# Patient Record
Sex: Male | Born: 1965 | Race: White | Hispanic: No | Marital: Married | Smoking: Former smoker
Health system: Southern US, Community
[De-identification: ages and names within clinical notes are randomized; demographics above are authoritative.]

## PROBLEM LIST (undated history)

## (undated) DIAGNOSIS — I5020 Unspecified systolic (congestive) heart failure: Secondary | ICD-10-CM

## (undated) DIAGNOSIS — I2510 Atherosclerotic heart disease of native coronary artery without angina pectoris: Secondary | ICD-10-CM

## (undated) DIAGNOSIS — Z951 Presence of aortocoronary bypass graft: Secondary | ICD-10-CM

## (undated) DIAGNOSIS — I200 Unstable angina: Secondary | ICD-10-CM

## (undated) DIAGNOSIS — Z8249 Family history of ischemic heart disease and other diseases of the circulatory system: Secondary | ICD-10-CM

## (undated) DIAGNOSIS — C8190 Hodgkin lymphoma, unspecified, unspecified site: Secondary | ICD-10-CM

## (undated) DIAGNOSIS — I255 Ischemic cardiomyopathy: Secondary | ICD-10-CM

## (undated) DIAGNOSIS — I447 Left bundle-branch block, unspecified: Secondary | ICD-10-CM

## (undated) DIAGNOSIS — Z923 Personal history of irradiation: Secondary | ICD-10-CM

## (undated) DIAGNOSIS — I10 Essential (primary) hypertension: Secondary | ICD-10-CM

## (undated) DIAGNOSIS — E785 Hyperlipidemia, unspecified: Secondary | ICD-10-CM

## (undated) HISTORY — PX: CARDIAC CATHETERIZATION: SHX172

## (undated) HISTORY — PX: CARPAL TUNNEL RELEASE: SHX101

## (undated) HISTORY — DX: Ischemic cardiomyopathy: I25.5

## (undated) HISTORY — DX: Unspecified systolic (congestive) heart failure: I50.20

---

## 1992-01-07 DIAGNOSIS — C8190 Hodgkin lymphoma, unspecified, unspecified site: Secondary | ICD-10-CM

## 1992-01-07 HISTORY — PX: LYMPH NODE BIOPSY: SHX201

## 1992-01-07 HISTORY — DX: Hodgkin lymphoma, unspecified, unspecified site: C81.90

## 1992-10-21 DIAGNOSIS — Z923 Personal history of irradiation: Secondary | ICD-10-CM

## 1992-10-21 HISTORY — DX: Personal history of irradiation: Z92.3

## 2004-01-22 ENCOUNTER — Ambulatory Visit: Payer: Self-pay | Admitting: Oncology

## 2004-02-07 ENCOUNTER — Ambulatory Visit: Payer: Self-pay | Admitting: Oncology

## 2004-07-29 ENCOUNTER — Ambulatory Visit: Payer: Self-pay | Admitting: Oncology

## 2004-08-06 ENCOUNTER — Ambulatory Visit: Payer: Self-pay | Admitting: Oncology

## 2004-11-26 ENCOUNTER — Ambulatory Visit: Payer: Self-pay | Admitting: Oncology

## 2004-12-02 ENCOUNTER — Ambulatory Visit: Payer: Self-pay | Admitting: Oncology

## 2004-12-06 ENCOUNTER — Ambulatory Visit: Payer: Self-pay | Admitting: Oncology

## 2005-06-02 ENCOUNTER — Ambulatory Visit: Payer: Self-pay | Admitting: Oncology

## 2006-05-07 ENCOUNTER — Ambulatory Visit: Payer: Self-pay | Admitting: Oncology

## 2006-05-08 ENCOUNTER — Emergency Department: Payer: Self-pay | Admitting: Emergency Medicine

## 2006-06-18 ENCOUNTER — Ambulatory Visit: Payer: Self-pay | Admitting: Oncology

## 2006-06-26 ENCOUNTER — Emergency Department: Payer: Self-pay | Admitting: Emergency Medicine

## 2006-07-07 ENCOUNTER — Ambulatory Visit: Payer: Self-pay | Admitting: Oncology

## 2007-02-17 ENCOUNTER — Emergency Department: Payer: Self-pay | Admitting: Emergency Medicine

## 2007-07-07 ENCOUNTER — Ambulatory Visit: Payer: Self-pay | Admitting: Oncology

## 2010-07-25 ENCOUNTER — Ambulatory Visit: Payer: Self-pay | Admitting: Internal Medicine

## 2011-10-21 ENCOUNTER — Inpatient Hospital Stay: Payer: Self-pay | Admitting: Internal Medicine

## 2011-10-22 ENCOUNTER — Inpatient Hospital Stay (HOSPITAL_COMMUNITY)
Admission: AD | Admit: 2011-10-22 | Discharge: 2011-10-28 | DRG: 236 | Disposition: A | Discharge status: Home / Self Care | Payer: 59 | Source: Other Acute Inpatient Hospital | Attending: Thoracic Surgery (Cardiothoracic Vascular Surgery) | Admitting: Thoracic Surgery (Cardiothoracic Vascular Surgery)

## 2011-10-22 ENCOUNTER — Inpatient Hospital Stay (HOSPITAL_COMMUNITY): Payer: 59

## 2011-10-22 ENCOUNTER — Encounter (HOSPITAL_COMMUNITY): Payer: Self-pay | Admitting: *Deleted

## 2011-10-22 ENCOUNTER — Other Ambulatory Visit: Payer: Self-pay | Admitting: *Deleted

## 2011-10-22 DIAGNOSIS — I2510 Atherosclerotic heart disease of native coronary artery without angina pectoris: Principal | ICD-10-CM

## 2011-10-22 DIAGNOSIS — Z951 Presence of aortocoronary bypass graft: Secondary | ICD-10-CM

## 2011-10-22 DIAGNOSIS — Z9221 Personal history of antineoplastic chemotherapy: Secondary | ICD-10-CM

## 2011-10-22 DIAGNOSIS — Z923 Personal history of irradiation: Secondary | ICD-10-CM

## 2011-10-22 DIAGNOSIS — Y832 Surgical operation with anastomosis, bypass or graft as the cause of abnormal reaction of the patient, or of later complication, without mention of misadventure at the time of the procedure: Secondary | ICD-10-CM | POA: Diagnosis not present

## 2011-10-22 DIAGNOSIS — Z23 Encounter for immunization: Secondary | ICD-10-CM

## 2011-10-22 DIAGNOSIS — Z7982 Long term (current) use of aspirin: Secondary | ICD-10-CM

## 2011-10-22 DIAGNOSIS — D62 Acute posthemorrhagic anemia: Secondary | ICD-10-CM | POA: Diagnosis not present

## 2011-10-22 DIAGNOSIS — F17200 Nicotine dependence, unspecified, uncomplicated: Secondary | ICD-10-CM | POA: Diagnosis present

## 2011-10-22 DIAGNOSIS — I200 Unstable angina: Secondary | ICD-10-CM | POA: Diagnosis present

## 2011-10-22 DIAGNOSIS — K929 Disease of digestive system, unspecified: Secondary | ICD-10-CM | POA: Diagnosis not present

## 2011-10-22 DIAGNOSIS — E785 Hyperlipidemia, unspecified: Secondary | ICD-10-CM

## 2011-10-22 DIAGNOSIS — Z8571 Personal history of Hodgkin lymphoma: Secondary | ICD-10-CM

## 2011-10-22 DIAGNOSIS — I498 Other specified cardiac arrhythmias: Secondary | ICD-10-CM | POA: Diagnosis not present

## 2011-10-22 DIAGNOSIS — K560 Paralytic ileus: Secondary | ICD-10-CM | POA: Diagnosis not present

## 2011-10-22 DIAGNOSIS — K5909 Other constipation: Secondary | ICD-10-CM | POA: Diagnosis present

## 2011-10-22 DIAGNOSIS — I10 Essential (primary) hypertension: Secondary | ICD-10-CM | POA: Diagnosis present

## 2011-10-22 DIAGNOSIS — Z8249 Family history of ischemic heart disease and other diseases of the circulatory system: Secondary | ICD-10-CM

## 2011-10-22 DIAGNOSIS — Z79899 Other long term (current) drug therapy: Secondary | ICD-10-CM

## 2011-10-22 DIAGNOSIS — N289 Disorder of kidney and ureter, unspecified: Secondary | ICD-10-CM | POA: Diagnosis not present

## 2011-10-22 HISTORY — DX: Family history of ischemic heart disease and other diseases of the circulatory system: Z82.49

## 2011-10-22 HISTORY — DX: Hodgkin lymphoma, unspecified, unspecified site: C81.90

## 2011-10-22 HISTORY — DX: Unstable angina: I20.0

## 2011-10-22 HISTORY — DX: Personal history of irradiation: Z92.3

## 2011-10-22 HISTORY — DX: Atherosclerotic heart disease of native coronary artery without angina pectoris: I25.10

## 2011-10-22 HISTORY — DX: Essential (primary) hypertension: I10

## 2011-10-22 HISTORY — DX: Hyperlipidemia, unspecified: E78.5

## 2011-10-22 HISTORY — DX: Presence of aortocoronary bypass graft: Z95.1

## 2011-10-22 LAB — CBC
MCH: 31.2 pg (ref 26.0–34.0)
MCHC: 35.3 g/dL (ref 30.0–36.0)
MCV: 88.6 fL (ref 78.0–100.0)
Platelets: 174 10*3/uL (ref 150–400)
RBC: 5.86 MIL/uL — ABNORMAL HIGH (ref 4.22–5.81)
RDW: 13.1 % (ref 11.5–15.5)

## 2011-10-22 LAB — COMPREHENSIVE METABOLIC PANEL
AST: 20 U/L (ref 0–37)
CO2: 27 mEq/L (ref 19–32)
Calcium: 10.1 mg/dL (ref 8.4–10.5)
Creatinine, Ser: 1.33 mg/dL (ref 0.50–1.35)
GFR calc non Af Amer: 62 mL/min — ABNORMAL LOW (ref >90)
Sodium: 139 mEq/L (ref 135–145)
Total Protein: 7.7 g/dL (ref 6.0–8.3)

## 2011-10-22 LAB — PROTIME-INR: INR: 0.96 (ref 0.00–1.49)

## 2011-10-22 MED ORDER — SENNA 8.6 MG PO TABS
4.0000 | ORAL_TABLET | Freq: Every day | ORAL | Status: DC
  Filled 2011-10-22: qty 4

## 2011-10-22 MED ORDER — METOPROLOL TARTRATE 25 MG PO TABS
25.0000 mg | ORAL_TABLET | Freq: Two times a day (BID) | ORAL | Status: DC
  Administered 2011-10-22 (×2): 25 mg via ORAL
  Filled 2011-10-22 (×4): qty 1

## 2011-10-22 MED ORDER — ACETAMINOPHEN 650 MG RE SUPP: 650.0000 mg | Freq: Four times a day (QID) | RECTAL | Status: DC | PRN

## 2011-10-22 MED ORDER — ALUM & MAG HYDROXIDE-SIMETH 200-200-20 MG/5ML PO SUSP: 30.0000 mL | Freq: Four times a day (QID) | ORAL | Status: DC | PRN

## 2011-10-22 MED ORDER — ASPIRIN EC 325 MG PO TBEC
325.0000 mg | DELAYED_RELEASE_TABLET | Freq: Every day | ORAL | Status: DC
  Administered 2011-10-22 – 2011-10-23 (×2): 325 mg via ORAL
  Filled 2011-10-22 (×3): qty 1

## 2011-10-22 MED ORDER — HYDROCODONE-ACETAMINOPHEN 5-325 MG PO TABS: 1.0000 | ORAL_TABLET | ORAL | Status: DC | PRN

## 2011-10-22 MED ORDER — ONDANSETRON HCL 4 MG/2ML IJ SOLN: 4.0000 mg | Freq: Four times a day (QID) | INTRAMUSCULAR | Status: DC | PRN

## 2011-10-22 MED ORDER — ENOXAPARIN SODIUM 40 MG/0.4ML ~~LOC~~ SOLN
40.0000 mg | SUBCUTANEOUS | Status: DC
  Administered 2011-10-22 – 2011-10-23 (×2): 40 mg via SUBCUTANEOUS
  Filled 2011-10-22 (×3): qty 0.4

## 2011-10-22 MED ORDER — SODIUM CHLORIDE 0.9 % IV SOLN: 250.0000 mL | INTRAVENOUS | Status: DC | PRN

## 2011-10-22 MED ORDER — SODIUM CHLORIDE 0.9 % IJ SOLN: 3.0000 mL | INTRAMUSCULAR | Status: DC | PRN

## 2011-10-22 MED ORDER — NITROGLYCERIN 0.4 MG SL SUBL: 0.4000 mg | SUBLINGUAL_TABLET | SUBLINGUAL | Status: DC | PRN

## 2011-10-22 MED ORDER — ACETAMINOPHEN 325 MG PO TABS: 650.0000 mg | ORAL_TABLET | Freq: Four times a day (QID) | ORAL | Status: DC | PRN

## 2011-10-22 MED ORDER — SODIUM CHLORIDE 0.9 % IJ SOLN: 3.0000 mL | Freq: Two times a day (BID) | INTRAMUSCULAR | Status: DC

## 2011-10-22 MED ORDER — ONDANSETRON HCL 4 MG PO TABS: 4.0000 mg | ORAL_TABLET | Freq: Four times a day (QID) | ORAL | Status: DC | PRN

## 2011-10-22 MED ORDER — SENNA 8.6 MG PO TABS
1.0000 | ORAL_TABLET | Freq: Every day | ORAL | Status: DC
  Filled 2011-10-22: qty 1

## 2011-10-22 MED ORDER — SODIUM CHLORIDE 0.9 % IJ SOLN
3.0000 mL | Freq: Two times a day (BID) | INTRAMUSCULAR | Status: DC
  Administered 2011-10-22 – 2011-10-23 (×3): 3 mL via INTRAVENOUS

## 2011-10-22 NOTE — H&P (Signed)
[address]            [place] [address]          [phone]     Willie [name] is an 46 y.o. male. 09/10/1966 YQM:[id number]   Chief Complaint: Chest pressure and shortness of breath with exertion  History of Presenting Illness:  This is a 46 year old Caucasian male who had complaints of chest pressure and shortness of breath with exertion. 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 He was being seen by a primary care physician in [place] earlier this summer. He then decided to switch physicians.His new primary care doctor ordered an EKG, after learning of his chest pressure.He was then referred to a cardiologist in the [facility]. A Myoview stress test and an echocardiogram gram were done. The echocardiogram apparently showed moderate left ventricular dysfunction (LVEF of 30%), mild left atrial enlargement, and moderate mitral and tricuspid insufficiency. The Myoview  stress test showed no evidence of myocardial ischemia.Because of the patient's symptoms of chest pressure with exertion, it was recommended that he undergo a cardiac catheterization. He was admitted to [facility] on 10/21/2011 in order to undergo this procedure.He was found to have multivessel coronary artery disease. He was accepted in transfer to [facility] for consideration of coronary artery bypsss grafting surgery. Currently, he is in no acute distress. He denies any chest pressure or pain or shortness of breath. His vital signs are stable.  Past Medical History: 1.Hodgkin's lymphoma (had both chemotherapy and radiation 94') 2. Hypertension 3. Hyperlipidemia  Past Surgical History: Lymph node biopsy on his right neck  Family History: Mother is alive. She has carotid disease. Father is deceased at age 52. He had a stroke, MI,  kidney transplant Brother had an MI at age 68.  Social History:  He denies alcohol use. He did smoke cigarettes but quit 15 years ago. When he did smoke, it was 1/4 of a pack for about 5 years. He now uses "dip" (smokeless tobacco).  Allergies: No known drug allergies  Medications Prior to Admission: 1. Metoprolol tartrate 50 mg by mouth 2 times daily 2. Lisinopril/hydrochlorothiazide 10/12.5 mg one half tablet by mouth daily 3. Enteric coated aspirin 81 mg by mouth daily 4.Senokot 4 daily 5.Testosterone injection every 2 weeks  Review of Systems:  Cardiac Review of Systems: Y or N  Chest Pain [ n ] Resting SOB [ n ] Exertional SOB [ y ] Pollyann Kennedy Milo.Brash ]  Pedal Edema [n ] Palpitations [n ] Syncope [ ]  Presyncope [ n ]  General Review of Systems: [Y] = yes [ ] =no  Constitional: recent weight change [ ] ; anorexia [n ]; fatigue [ ] ; nausea [n ]; night sweats [n ]; fever [n ]; or chills [n ];  Eye : blurred vision [n ]; diplopia Milo.Brash ]; vision changes [n ]; Amaurosis fugax[n ];  Resp: cough [n ]; wheezing[n ]; hemoptysis[n ]; paroxysmal nocturnal dyspnea[n ]; dyspnea on exertion[y ]; or orthopnea[ n];  GI: , vomiting[n ]; dysphagia[n ]; melena[ n]; hematochezia [n ]; heartburn[n ];  GU: kidney stones [n ]; hematuria[ n]; dysuria [n ]; nocturian[ ] ; Skin: rash, swelling[ ] ;, hair loss[ ] ; peripheral edema[ ] ; or itching[ ] ;  Musculosketetal: myalgias[ n]; joint swelling[n ]; joint  erythema[ n];  joint pain[ n]; back pain[ n];  Heme/Lymph:  bleeding[n ]; anemia[ n];  Neuro: TIA[ n]; headaches[ n]; stroke[n ]; vertigo[ n]; seizures[n ]; paresthesias[ n]; difficulty walking[n ];  Psych:depression[n ]; anxiety[ n];  Endocrine: diabetes[ n]; thyroid dysfunction[ n];    Physical Exam: General: No acute distress, alert oriented cooperative.He has a very supportive wife who is at his bedside. HEENT: head- atraumatic, normocephalic;Eyes-  extraocular muscles are intact, pupils are equal round and  reactive to light and accommodation, sclera nonicteric; nec-k is supple no JVD, no JVD, no bruits Pulmonary: Clear to auscultation bilaterally; no rales, wheezes, or rhonchi Abdomen: Soft, nontender, bowel sounds present, no rebound, no guarding Extremities: No cyanosis, clubbing, or edema; 2+ bilateral dorsalis pedis and posterior tibialis Neurologic: Cranial nerve II through XII are grossly intact no focal deficit  Diagnostic Studies and Laboratory Results: Cardiac catheterization done by Dr. Gwen Pounds on 10/21/2011: Results are as follows: 85% ostial stenosis of the left main, 20% stenosis of the proximal LAD , 20% stenosis of diagonal 1, 50% stenosis of the mid circumflex,85% stenosis of obtuse marginal 1, 60% stenosis of the ostial RCA, 50% stenosis of the distal RCA,and 20% stenosis of the mid and proximal RCA  Assessment/Plan Dr. Cornelius Moras will review cath films. Pre op doppler carotid US, labs, etc. Likely CABG on Friday.  Willie [name],Willie M, PA-C 10/22/2011, 11:56 AM    I have reviewed the patient's history, review of systems, physical exam and cardiac cath films.  I spent in excess of 50 minutes including > 30 minutes face-to-face with patient and his family.  Willie [name] presents with accelerating symptoms of crescendo angina pectoris c/w unstable angina.  Cath demonstrates long segement 85% ostial and proximal left main coronary stenosis and 3-vessel CAD.  There is mild LV systolic dysfunction.  There is no question that CABG is the best treatment option.  Willie [name] is otherwise healthy and should be an excellent candidate for surgery, although risks may be somewhat increased by the fact that he underwent full mantle radiation therapy to the chest in 1994 for Hodgkin's lymphoma.  This may increase the risk of sternal wound complications and impact whether either or both internal mammary arteries may be used for grafting.  The patient and his wife understand and accept all potential  associated risks of surgery including but not limited to risk of death, stroke, myocardial infarction, congestive heart failure, respiratory failure, renal failure, bleeding requiring blood transfusion and/or reexploration, arrhythmia, heart block or bradycardia requiring permanent pacemaker, pneumonia, pleural effusion, wound infection, pulmonary embolus or other thromboembolic complication, chronic pain or other delayed complications including late recurrence of symptomatic coronary artery disease.  All questions answered.  We tentatively plan CABG first case Friday 10/18.  Will remove IV from left arm in case left radial artery needs to be harvested for grafting.

## 2011-10-22 NOTE — Progress Notes (Signed)
PFT completed at bedside. Unconfirmed results in Shadow Chart

## 2011-10-23 ENCOUNTER — Encounter (HOSPITAL_COMMUNITY): Payer: Self-pay | Admitting: Thoracic Surgery (Cardiothoracic Vascular Surgery)

## 2011-10-23 ENCOUNTER — Inpatient Hospital Stay (HOSPITAL_COMMUNITY): Payer: 59

## 2011-10-23 DIAGNOSIS — E785 Hyperlipidemia, unspecified: Secondary | ICD-10-CM

## 2011-10-23 DIAGNOSIS — Z8249 Family history of ischemic heart disease and other diseases of the circulatory system: Secondary | ICD-10-CM

## 2011-10-23 DIAGNOSIS — Z01810 Encounter for preprocedural cardiovascular examination: Secondary | ICD-10-CM

## 2011-10-23 DIAGNOSIS — I2510 Atherosclerotic heart disease of native coronary artery without angina pectoris: Secondary | ICD-10-CM

## 2011-10-23 HISTORY — DX: Hyperlipidemia, unspecified: E78.5

## 2011-10-23 HISTORY — DX: Family history of ischemic heart disease and other diseases of the circulatory system: Z82.49

## 2011-10-23 LAB — URINALYSIS, ROUTINE W REFLEX MICROSCOPIC
Glucose, UA: NEGATIVE mg/dL
Hgb urine dipstick: NEGATIVE
Protein, ur: NEGATIVE mg/dL

## 2011-10-23 LAB — HEMOGLOBIN A1C: Hgb A1c MFr Bld: 5.4 % (ref <5.7)

## 2011-10-23 LAB — CBC
HCT: 49.8 % (ref 39.0–52.0)
Hemoglobin: 17.6 g/dL — ABNORMAL HIGH (ref 13.0–17.0)
MCH: 31.3 pg (ref 26.0–34.0)
MCHC: 35.3 g/dL (ref 30.0–36.0)
MCV: 88.5 fL (ref 78.0–100.0)
RDW: 13 % (ref 11.5–15.5)

## 2011-10-23 LAB — TYPE AND SCREEN: ABO/RH(D): O POS

## 2011-10-23 LAB — LIPID PANEL
HDL: 26 mg/dL — ABNORMAL LOW (ref >39)
Total CHOL/HDL Ratio: 9.4 RATIO

## 2011-10-23 LAB — BASIC METABOLIC PANEL
BUN: 15 mg/dL (ref 6–23)
Creatinine, Ser: 1.36 mg/dL — ABNORMAL HIGH (ref 0.50–1.35)
GFR calc Af Amer: 70 mL/min — ABNORMAL LOW (ref >90)
GFR calc non Af Amer: 60 mL/min — ABNORMAL LOW (ref >90)
Glucose, Bld: 80 mg/dL (ref 70–99)

## 2011-10-23 LAB — APTT: aPTT: 33 seconds (ref 24–37)

## 2011-10-23 MED ORDER — PHENYLEPHRINE HCL 10 MG/ML IJ SOLN
30.0000 ug/min | INTRAVENOUS | Status: DC
  Administered 2011-10-24: 80 ug/min via INTRAVENOUS
  Filled 2011-10-23: qty 2

## 2011-10-23 MED ORDER — MAGNESIUM SULFATE 50 % IJ SOLN
40.0000 meq | INTRAMUSCULAR | Status: DC
  Filled 2011-10-23: qty 10

## 2011-10-23 MED ORDER — EPINEPHRINE HCL 1 MG/ML IJ SOLN
0.5000 ug/min | INTRAVENOUS | Status: DC
  Filled 2011-10-23: qty 4

## 2011-10-23 MED ORDER — SODIUM CHLORIDE 0.9 % IV SOLN
INTRAVENOUS | Status: DC
  Administered 2011-10-24: 70 mL/h via INTRAVENOUS
  Filled 2011-10-23: qty 40

## 2011-10-23 MED ORDER — CHLORHEXIDINE GLUCONATE 4 % EX LIQD
60.0000 mL | Freq: Once | CUTANEOUS | Status: AC
  Administered 2011-10-24: 4 via TOPICAL
  Filled 2011-10-23: qty 60

## 2011-10-23 MED ORDER — SODIUM CHLORIDE 0.9 % IV SOLN
INTRAVENOUS | Status: DC
  Administered 2011-10-24: 1.1 [IU]/h via INTRAVENOUS
  Filled 2011-10-23: qty 1

## 2011-10-23 MED ORDER — NITROGLYCERIN IN D5W 200-5 MCG/ML-% IV SOLN
2.0000 ug/min | INTRAVENOUS | Status: DC
  Filled 2011-10-23: qty 250

## 2011-10-23 MED ORDER — VANCOMYCIN HCL 1000 MG IV SOLR
1500.0000 mg | INTRAVENOUS | Status: AC
  Administered 2011-10-24: 1500 mg via INTRAVENOUS
  Filled 2011-10-23: qty 1500

## 2011-10-23 MED ORDER — SENNA 8.6 MG PO TABS
2.0000 | ORAL_TABLET | Freq: Once | ORAL | Status: AC
  Administered 2011-10-23: 17.2 mg via ORAL
  Filled 2011-10-23: qty 2

## 2011-10-23 MED ORDER — BISACODYL 5 MG PO TBEC
5.0000 mg | DELAYED_RELEASE_TABLET | Freq: Once | ORAL | Status: AC
  Administered 2011-10-23: 5 mg via ORAL
  Filled 2011-10-23: qty 1

## 2011-10-23 MED ORDER — DEXMEDETOMIDINE HCL IN NACL 400 MCG/100ML IV SOLN
0.1000 ug/kg/h | INTRAVENOUS | Status: DC
  Administered 2011-10-24: .3 ug/kg/h via INTRAVENOUS
  Filled 2011-10-23: qty 100

## 2011-10-23 MED ORDER — TEMAZEPAM 15 MG PO CAPS: 15.0000 mg | ORAL_CAPSULE | Freq: Once | ORAL | Status: AC | PRN

## 2011-10-23 MED ORDER — METOPROLOL TARTRATE 12.5 MG HALF TABLET
12.5000 mg | ORAL_TABLET | Freq: Two times a day (BID) | ORAL | Status: DC
  Administered 2011-10-23 (×2): 12.5 mg via ORAL
  Filled 2011-10-23 (×4): qty 1

## 2011-10-23 MED ORDER — BISACODYL 10 MG RE SUPP
10.0000 mg | Freq: Every day | RECTAL | Status: DC | PRN
  Administered 2011-10-23: 10 mg via RECTAL
  Filled 2011-10-23: qty 1

## 2011-10-23 MED ORDER — DOPAMINE-DEXTROSE 3.2-5 MG/ML-% IV SOLN
2.0000 ug/kg/min | INTRAVENOUS | Status: DC
  Filled 2011-10-23: qty 250

## 2011-10-23 MED ORDER — TEMAZEPAM 15 MG PO CAPS: 15.0000 mg | ORAL_CAPSULE | Freq: Once | ORAL | Status: DC | PRN

## 2011-10-23 MED ORDER — ALPRAZOLAM 0.25 MG PO TABS: 0.2500 mg | ORAL_TABLET | Freq: Three times a day (TID) | ORAL | Status: DC | PRN

## 2011-10-23 MED ORDER — PLASMA-LYTE 148 IV SOLN
INTRAVENOUS | Status: DC
  Filled 2011-10-23: qty 2.5

## 2011-10-23 MED ORDER — DEXTROSE 5 % IV SOLN
750.0000 mg | INTRAVENOUS | Status: DC
  Filled 2011-10-23 (×2): qty 750

## 2011-10-23 MED ORDER — DEXTROSE 5 % IV SOLN
1.5000 g | INTRAVENOUS | Status: DC
  Administered 2011-10-24: .75 g via INTRAVENOUS
  Administered 2011-10-24: 1.5 g via INTRAVENOUS
  Filled 2011-10-23: qty 1.5

## 2011-10-23 MED ORDER — POTASSIUM CHLORIDE 2 MEQ/ML IV SOLN
80.0000 meq | INTRAVENOUS | Status: DC
  Filled 2011-10-23: qty 40

## 2011-10-23 MED ORDER — CHLORHEXIDINE GLUCONATE 4 % EX LIQD
60.0000 mL | Freq: Once | CUTANEOUS | Status: DC
  Filled 2011-10-23: qty 60

## 2011-10-23 MED ORDER — METOPROLOL TARTRATE 12.5 MG HALF TABLET: 12.5000 mg | ORAL_TABLET | Freq: Once | ORAL | Status: DC

## 2011-10-23 MED ORDER — METOPROLOL TARTRATE 12.5 MG HALF TABLET
12.5000 mg | ORAL_TABLET | Freq: Once | ORAL | Status: AC
  Administered 2011-10-24: 12.5 mg via ORAL
  Filled 2011-10-23: qty 1

## 2011-10-23 NOTE — Care Management Note (Unsigned)
    Page 1 of 1   10/23/2011     3:16:28 PM   CARE MANAGEMENT NOTE 10/23/2011  Patient:  Willie [name], Willie [name]   Account Number:  [id number]  Date Initiated:  10/23/2011  Documentation initiated by:  Roxie Kreeger  Subjective/Objective Assessment:   PT ADM ON 10/22/11 WITH MULTIVESSEL CORONARY ARTERY DISEASE.  PLANNING FOR CABG ON 10/24/11 AM.  PTA, PT INDEPENDENT, LIVES WITH SPOUSE.     Action/Plan:   MET WITH PT TO DISCUSS DC PLANS.  WIFE TO PROVIDE 24HR CARE AT DISCHARGE.  WILL FOLLOW FOR HOME NEEDS AS PT PROGRESSES.   Anticipated DC Date:  10/29/2011   Anticipated DC Plan:  HOME W HOME HEALTH SERVICES      DC Planning Services  CM consult      Choice offered to / List presented to:             Status of service:  In process, will continue to follow Medicare Important Message given?   (If response is "NO", the following Medicare IM given date fields will be blank) Date Medicare IM given:   Date Additional Medicare IM given:    Discharge Disposition:    Per UR Regulation:  Reviewed for med. necessity/level of care/duration of stay  If discussed at Long Length of Stay Meetings, dates discussed:    Comments:

## 2011-10-23 NOTE — Progress Notes (Addendum)
                   [address]            [name] [address]          [phone]         Subjective: Has complaints of constipation-no bowel movement since Tuesday. Had one episode last evening where he got short of breath and had diaphoresis, but no chest pressure or pain.  Objective: Vital signs in last 24 hours: Patient Vitals for the past 24 hrs:  BP Temp Temp src Pulse Resp SpO2 Height Weight  10/23/11 0429 118/80 mmHg 97.8 F (36.6 C) Oral 69  20  97 % - -  10/22/11 2046 112/72 mmHg 97.8 F (36.6 C) Oral 73  20  95 % - -  10/22/11 1407 133/73 mmHg 97.6 F (36.4 C) Oral 74  18  98 % - -  10/22/11 1115 131/79 mmHg 98.5 F (36.9 C) Oral 96  18  96 % 5\' 8"  (1.727 m) 199 lb 4.7 oz (90.4 kg)    Current Weight  10/22/11 199 lb 4.7 oz (90.4 kg)      Intake/Output from previous day: 10/16 0701 - 10/17 0700 In: -  Out: 2 [Urine:2]   Physical Exam:  Cardiovascular: Slightly bradycardic  Pulmonary: Clear to auscultation bilaterally; no rales, wheezes, or rhonchi. Abdomen: Soft, non tender, slight distention,bowel sounds present. Extremities: Mild bilateral lower extremity edema. Wounds: Clean and dry.  No erythema or signs of infection.  Lab Results: CBC: Basename 10/22/11 1433  WBC 9.7  HGB 18.3*  HCT 51.9  PLT 174   BMET:  Basename 10/22/11 1433  NA 139  K 4.1  CL 100  CO2 27  GLUCOSE 103*  BUN 14  CREATININE 1.33  CALCIUM 10.1    PT/INR:  Lab Results  Component Value Date   INR 0.96 10/22/2011   ABG:  INR: Will add last result for INR, ABG once components are confirmed Will add last 4 CBG results once components are confirmed  Assessment/Plan:  1.CAD (left main included) - for CABG in am 2.Constipation-LOC  ZIMMERMAN,DONIELLE MPA-C 10/23/2011,7:30 AM   I have seen and examined the patient and agree with the assessment and plan as outlined.  I have again reviewed the indications, risks and potential benefits of surgery  with Mr Saitta and his family.  All questions answered.  For OR in am.  OWEN,CLARENCE H 10/23/2011 1:40 PM

## 2011-10-23 NOTE — Progress Notes (Addendum)
Pre-op Cardiac Surgery  Carotid Findings:  No ICA stenosis.  Vertebral artery flow is antegrade.  Upper Extremity Right Left  Brachial Pressures 115 T 109 T  Radial Waveforms T T  Ulnar Waveforms T T  Palmar Arch (Allen's Test) WNL WNL   Findings:  Doppler waveforms remain normal with ulnar and radial compressions bilaterally.    Lower  Extremity Right Left  Dorsalis Pedis    Anterior Tibial    Posterior Tibial    Ankle/Brachial Indices      Findings:

## 2011-10-24 ENCOUNTER — Encounter (HOSPITAL_COMMUNITY): Payer: Self-pay | Admitting: Thoracic Surgery (Cardiothoracic Vascular Surgery)

## 2011-10-24 ENCOUNTER — Encounter (HOSPITAL_COMMUNITY)
Admission: AD | Disposition: A | Discharge status: Home / Self Care | Payer: Self-pay | Source: Other Acute Inpatient Hospital | Attending: Thoracic Surgery (Cardiothoracic Vascular Surgery)

## 2011-10-24 ENCOUNTER — Inpatient Hospital Stay (HOSPITAL_COMMUNITY): Payer: 59 | Admitting: Anesthesiology

## 2011-10-24 ENCOUNTER — Encounter (HOSPITAL_COMMUNITY): Payer: Self-pay | Admitting: Anesthesiology

## 2011-10-24 ENCOUNTER — Inpatient Hospital Stay (HOSPITAL_COMMUNITY): Payer: 59

## 2011-10-24 DIAGNOSIS — I2510 Atherosclerotic heart disease of native coronary artery without angina pectoris: Secondary | ICD-10-CM

## 2011-10-24 DIAGNOSIS — Z951 Presence of aortocoronary bypass graft: Secondary | ICD-10-CM

## 2011-10-24 HISTORY — PX: RADIAL ARTERY HARVEST: SHX5067

## 2011-10-24 HISTORY — PX: CORONARY ARTERY BYPASS GRAFT: SHX141

## 2011-10-24 HISTORY — DX: Presence of aortocoronary bypass graft: Z95.1

## 2011-10-24 LAB — POCT I-STAT 3, ART BLOOD GAS (G3+)
Acid-base deficit: 4 mmol/L — ABNORMAL HIGH (ref 0.0–2.0)
Acid-base deficit: 4 mmol/L — ABNORMAL HIGH (ref 0.0–2.0)
Bicarbonate: 25.7 mEq/L — ABNORMAL HIGH (ref 20.0–24.0)
O2 Saturation: 93 %
Patient temperature: 35.6
Patient temperature: 36.3
Patient temperature: 37.2
TCO2: 14 mmol/L (ref 0–100)
pH, Arterial: 7.372 (ref 7.350–7.450)
pH, Arterial: 7.341 — ABNORMAL LOW (ref 7.350–7.450)
pH, Arterial: 7.37 (ref 7.350–7.450)
pO2, Arterial: 98 mmHg (ref 80.0–100.0)
pO2, Arterial: 99 mmHg (ref 80.0–100.0)

## 2011-10-24 LAB — POCT I-STAT 4, (NA,K, GLUC, HGB,HCT)
Glucose, Bld: 96 mg/dL (ref 70–99)
Glucose, Bld: 133 mg/dL — ABNORMAL HIGH (ref 70–99)
HCT: 49 % (ref 39.0–52.0)
HCT: 41 % (ref 39.0–52.0)
Hemoglobin: 16.7 g/dL (ref 13.0–17.0)
Hemoglobin: 13.9 g/dL (ref 13.0–17.0)
Potassium: 4.1 mEq/L (ref 3.5–5.1)
Potassium: 3.8 mEq/L (ref 3.5–5.1)
Sodium: 139 mEq/L (ref 135–145)

## 2011-10-24 LAB — CBC
HCT: 50.1 % (ref 39.0–52.0)
HCT: 29.9 % — ABNORMAL LOW (ref 39.0–52.0)
Hemoglobin: 10.4 g/dL — ABNORMAL LOW (ref 13.0–17.0)
Hemoglobin: 13.4 g/dL (ref 13.0–17.0)
MCH: 30.3 pg (ref 26.0–34.0)
MCHC: 34.8 g/dL (ref 30.0–36.0)
MCHC: 34.9 g/dL (ref 30.0–36.0)
Platelets: 170 10*3/uL (ref 150–400)
RBC: 3.4 MIL/uL — ABNORMAL LOW (ref 4.22–5.81)
RDW: 13 % (ref 11.5–15.5)
RDW: 12.9 % (ref 11.5–15.5)
WBC: 7.8 10*3/uL (ref 4.0–10.5)
WBC: 12.3 10*3/uL — ABNORMAL HIGH (ref 4.0–10.5)

## 2011-10-24 LAB — GLUCOSE, CAPILLARY
Glucose-Capillary: 98 mg/dL (ref 70–99)
Glucose-Capillary: 90 mg/dL (ref 70–99)
Glucose-Capillary: 76 mg/dL (ref 70–99)
Glucose-Capillary: 102 mg/dL — ABNORMAL HIGH (ref 70–99)

## 2011-10-24 LAB — BASIC METABOLIC PANEL
BUN: 16 mg/dL (ref 6–23)
Chloride: 103 mEq/L (ref 96–112)
GFR calc Af Amer: 66 mL/min — ABNORMAL LOW (ref >90)
Potassium: 4.2 mEq/L (ref 3.5–5.1)

## 2011-10-24 LAB — APTT: aPTT: 48 seconds — ABNORMAL HIGH (ref 24–37)

## 2011-10-24 LAB — POCT I-STAT, CHEM 8
Chloride: 108 mEq/L (ref 96–112)
HCT: 39 % (ref 39.0–52.0)
Hemoglobin: 13.3 g/dL (ref 13.0–17.0)
Potassium: 4.4 mEq/L (ref 3.5–5.1)
Sodium: 143 mEq/L (ref 135–145)

## 2011-10-24 LAB — PLATELET COUNT: Platelets: 159 10*3/uL (ref 150–400)

## 2011-10-24 LAB — SURGICAL PCR SCREEN: MRSA, PCR: NEGATIVE

## 2011-10-24 LAB — PROTIME-INR
INR: 2.01 — ABNORMAL HIGH (ref 0.00–1.49)
Prothrombin Time: 22 seconds — ABNORMAL HIGH (ref 11.6–15.2)

## 2011-10-24 LAB — CREATININE, SERUM
Creatinine, Ser: 1.23 mg/dL (ref 0.50–1.35)
GFR calc non Af Amer: 68 mL/min — ABNORMAL LOW (ref >90)

## 2011-10-24 SURGERY — CORONARY ARTERY BYPASS GRAFTING (CABG)
Anesthesia: General | Site: Chest | Wound class: Clean

## 2011-10-24 MED ORDER — MAGNESIUM SULFATE 40 MG/ML IJ SOLN
4.0000 g | Freq: Once | INTRAMUSCULAR | Status: AC
  Administered 2011-10-24: 4 g via INTRAVENOUS
  Filled 2011-10-24: qty 100

## 2011-10-24 MED ORDER — SODIUM CHLORIDE 0.9 % IJ SOLN
OROMUCOSAL | Status: DC | PRN
  Administered 2011-10-24: 10:00:00 via TOPICAL

## 2011-10-24 MED ORDER — ALBUMIN HUMAN 5 % IV SOLN
INTRAVENOUS | Status: DC | PRN
  Administered 2011-10-24: 12:00:00 via INTRAVENOUS

## 2011-10-24 MED ORDER — ACETAMINOPHEN 500 MG PO TABS
1000.0000 mg | ORAL_TABLET | Freq: Four times a day (QID) | ORAL | Status: DC
  Administered 2011-10-25 – 2011-10-28 (×12): 1000 mg via ORAL
  Filled 2011-10-24 (×16): qty 2

## 2011-10-24 MED ORDER — METOPROLOL TARTRATE 1 MG/ML IV SOLN: 2.5000 mg | INTRAVENOUS | Status: DC | PRN

## 2011-10-24 MED ORDER — FAMOTIDINE IN NACL 20-0.9 MG/50ML-% IV SOLN
20.0000 mg | Freq: Two times a day (BID) | INTRAVENOUS | Status: AC
  Administered 2011-10-24: 20 mg via INTRAVENOUS

## 2011-10-24 MED ORDER — LACTATED RINGERS IV SOLN
INTRAVENOUS | Status: DC | PRN
  Administered 2011-10-24 (×2): via INTRAVENOUS

## 2011-10-24 MED ORDER — LACTATED RINGERS IV SOLN
INTRAVENOUS | Status: DC | PRN
  Administered 2011-10-24: 07:00:00 via INTRAVENOUS

## 2011-10-24 MED ORDER — SODIUM CHLORIDE 0.9 % IV SOLN
INTRAVENOUS | Status: DC
  Filled 2011-10-24: qty 1

## 2011-10-24 MED ORDER — DOPAMINE-DEXTROSE 3.2-5 MG/ML-% IV SOLN: 0.0000 ug/kg/min | INTRAVENOUS | Status: DC

## 2011-10-24 MED ORDER — NITROGLYCERIN IN D5W 200-5 MCG/ML-% IV SOLN: 7.0000 ug/min | INTRAVENOUS | Status: DC

## 2011-10-24 MED ORDER — 0.9 % SODIUM CHLORIDE (POUR BTL) OPTIME
TOPICAL | Status: DC | PRN
  Administered 2011-10-24: 6000 mL

## 2011-10-24 MED ORDER — PLASMA-LYTE 148 IV SOLN
INTRAVENOUS | Status: AC
  Administered 2011-10-24: 11:00:00
  Filled 2011-10-24: qty 2.5

## 2011-10-24 MED ORDER — NITROGLYCERIN IN D5W 200-5 MCG/ML-% IV SOLN
INTRAVENOUS | Status: DC | PRN
  Administered 2011-10-24: 5 ug/min via INTRAVENOUS

## 2011-10-24 MED ORDER — LACTATED RINGERS IV SOLN: INTRAVENOUS | Status: DC

## 2011-10-24 MED ORDER — FENTANYL CITRATE 0.05 MG/ML IJ SOLN: 50.0000 ug | INTRAMUSCULAR | Status: DC | PRN

## 2011-10-24 MED ORDER — ROCURONIUM BROMIDE 100 MG/10ML IV SOLN
INTRAVENOUS | Status: DC | PRN
  Administered 2011-10-24 (×2): 50 mg via INTRAVENOUS
  Administered 2011-10-24 (×2): 30 mg via INTRAVENOUS
  Administered 2011-10-24: 20 mg via INTRAVENOUS

## 2011-10-24 MED ORDER — DOCUSATE SODIUM 100 MG PO CAPS
200.0000 mg | ORAL_CAPSULE | Freq: Every day | ORAL | Status: DC
  Administered 2011-10-25 – 2011-10-27 (×3): 200 mg via ORAL
  Filled 2011-10-24 (×4): qty 2

## 2011-10-24 MED ORDER — PHENYLEPHRINE HCL 10 MG/ML IJ SOLN
0.0000 ug/min | INTRAVENOUS | Status: DC
  Administered 2011-10-25: 20 ug/min via INTRAVENOUS
  Filled 2011-10-24 (×3): qty 2

## 2011-10-24 MED ORDER — ACETAMINOPHEN 10 MG/ML IV SOLN
1000.0000 mg | Freq: Once | INTRAVENOUS | Status: AC
  Administered 2011-10-24: 1000 mg via INTRAVENOUS
  Filled 2011-10-24: qty 100

## 2011-10-24 MED ORDER — INSULIN ASPART 100 UNIT/ML ~~LOC~~ SOLN
0.0000 [IU] | SUBCUTANEOUS | Status: AC
  Administered 2011-10-25: 2 [IU] via SUBCUTANEOUS

## 2011-10-24 MED ORDER — DOPAMINE-DEXTROSE 3.2-5 MG/ML-% IV SOLN
INTRAVENOUS | Status: DC | PRN
  Administered 2011-10-24: 3 ug/kg/min via INTRAVENOUS

## 2011-10-24 MED ORDER — OXYCODONE HCL 5 MG PO TABS
5.0000 mg | ORAL_TABLET | ORAL | Status: DC | PRN
  Administered 2011-10-24 – 2011-10-26 (×9): 10 mg via ORAL
  Filled 2011-10-24 (×9): qty 2

## 2011-10-24 MED ORDER — PANTOPRAZOLE SODIUM 40 MG PO TBEC
40.0000 mg | DELAYED_RELEASE_TABLET | Freq: Every day | ORAL | Status: DC
  Administered 2011-10-26 – 2011-10-27 (×2): 40 mg via ORAL
  Filled 2011-10-24 (×2): qty 1

## 2011-10-24 MED ORDER — SODIUM BICARBONATE 8.4 % IV SOLN
50.0000 meq | Freq: Once | INTRAVENOUS | Status: AC
  Administered 2011-10-24: 50 meq via INTRAVENOUS
  Filled 2011-10-24: qty 50

## 2011-10-24 MED ORDER — VANCOMYCIN HCL IN DEXTROSE 1-5 GM/200ML-% IV SOLN
1000.0000 mg | Freq: Once | INTRAVENOUS | Status: AC
  Administered 2011-10-24: 1000 mg via INTRAVENOUS
  Filled 2011-10-24: qty 200

## 2011-10-24 MED ORDER — SODIUM CHLORIDE 0.9 % IJ SOLN
3.0000 mL | Freq: Two times a day (BID) | INTRAMUSCULAR | Status: DC
  Administered 2011-10-25 – 2011-10-26 (×3): 3 mL via INTRAVENOUS

## 2011-10-24 MED ORDER — INSULIN REGULAR BOLUS VIA INFUSION
0.0000 [IU] | Freq: Three times a day (TID) | INTRAVENOUS | Status: DC
  Filled 2011-10-24: qty 10

## 2011-10-24 MED ORDER — POTASSIUM CHLORIDE 10 MEQ/50ML IV SOLN
10.0000 meq | INTRAVENOUS | Status: AC
  Administered 2011-10-24 (×3): 10 meq via INTRAVENOUS

## 2011-10-24 MED ORDER — LIDOCAINE HCL (CARDIAC) 20 MG/ML IV SOLN
INTRAVENOUS | Status: DC | PRN
  Administered 2011-10-24: 100 mg via INTRAVENOUS

## 2011-10-24 MED ORDER — MORPHINE SULFATE 2 MG/ML IJ SOLN
2.0000 mg | INTRAMUSCULAR | Status: DC | PRN
  Administered 2011-10-24 – 2011-10-25 (×9): 4 mg via INTRAVENOUS
  Administered 2011-10-25: 2 mg via INTRAVENOUS
  Administered 2011-10-25: 4 mg via INTRAVENOUS
  Administered 2011-10-25: 2 mg via INTRAVENOUS
  Administered 2011-10-26 (×2): 4 mg via INTRAVENOUS
  Filled 2011-10-24 (×3): qty 2
  Filled 2011-10-24: qty 1
  Filled 2011-10-24 (×10): qty 2

## 2011-10-24 MED ORDER — SODIUM CHLORIDE 0.9 % IV SOLN: INTRAVENOUS | Status: DC

## 2011-10-24 MED ORDER — SUFENTANIL CITRATE 50 MCG/ML IV SOLN
INTRAVENOUS | Status: DC | PRN
  Administered 2011-10-24: 40 ug via INTRAVENOUS
  Administered 2011-10-24 (×3): 20 ug via INTRAVENOUS
  Administered 2011-10-24: 10 ug via INTRAVENOUS
  Administered 2011-10-24: 20 ug via INTRAVENOUS
  Administered 2011-10-24: 100 ug via INTRAVENOUS
  Administered 2011-10-24: 20 ug via INTRAVENOUS

## 2011-10-24 MED ORDER — PHENYLEPHRINE HCL 10 MG/ML IJ SOLN
30.0000 ug/min | INTRAMUSCULAR | Status: DC
  Filled 2011-10-24: qty 1

## 2011-10-24 MED ORDER — BISACODYL 10 MG RE SUPP: 10.0000 mg | Freq: Every day | RECTAL | Status: DC

## 2011-10-24 MED ORDER — METOPROLOL TARTRATE 25 MG/10 ML ORAL SUSPENSION
12.5000 mg | Freq: Two times a day (BID) | ORAL | Status: DC
  Filled 2011-10-24 (×5): qty 5

## 2011-10-24 MED ORDER — DEXMEDETOMIDINE HCL IN NACL 200 MCG/50ML IV SOLN
0.1000 ug/kg/h | INTRAVENOUS | Status: DC
  Administered 2011-10-24: 0.3 ug/kg/h via INTRAVENOUS
  Filled 2011-10-24: qty 50

## 2011-10-24 MED ORDER — ONDANSETRON HCL 4 MG/2ML IJ SOLN
4.0000 mg | Freq: Four times a day (QID) | INTRAMUSCULAR | Status: DC | PRN
  Administered 2011-10-25 (×2): 4 mg via INTRAVENOUS
  Filled 2011-10-24 (×2): qty 2

## 2011-10-24 MED ORDER — PROTAMINE SULFATE 10 MG/ML IV SOLN
INTRAVENOUS | Status: DC | PRN
  Administered 2011-10-24: 240 mg via INTRAVENOUS

## 2011-10-24 MED ORDER — ARTIFICIAL TEARS OP OINT
TOPICAL_OINTMENT | OPHTHALMIC | Status: DC | PRN
  Administered 2011-10-24 (×2): 1 via OPHTHALMIC

## 2011-10-24 MED ORDER — ACETAMINOPHEN 160 MG/5ML PO SOLN
975.0000 mg | Freq: Four times a day (QID) | ORAL | Status: DC
  Filled 2011-10-24: qty 40.6

## 2011-10-24 MED ORDER — MIDAZOLAM HCL 2 MG/2ML IJ SOLN: 0.5000 mg | INTRAMUSCULAR | Status: DC | PRN

## 2011-10-24 MED ORDER — METOPROLOL TARTRATE 12.5 MG HALF TABLET
12.5000 mg | ORAL_TABLET | Freq: Two times a day (BID) | ORAL | Status: DC
  Administered 2011-10-25 – 2011-10-26 (×2): 12.5 mg via ORAL
  Filled 2011-10-24 (×5): qty 1

## 2011-10-24 MED ORDER — LACTATED RINGERS IV SOLN
INTRAVENOUS | Status: DC | PRN
  Administered 2011-10-24 (×2): via INTRAVENOUS

## 2011-10-24 MED ORDER — SODIUM CHLORIDE 0.9 % IJ SOLN: 3.0000 mL | INTRAMUSCULAR | Status: DC | PRN

## 2011-10-24 MED ORDER — HEPARIN SODIUM (PORCINE) 1000 UNIT/ML IJ SOLN
INTRAMUSCULAR | Status: DC | PRN
  Administered 2011-10-24: 33000 [IU] via INTRAVENOUS
  Administered 2011-10-24: 3000 [IU] via INTRAVENOUS

## 2011-10-24 MED ORDER — SODIUM CHLORIDE 0.9 % IV SOLN: 250.0000 mL | INTRAVENOUS | Status: DC

## 2011-10-24 MED ORDER — ASPIRIN 81 MG PO CHEW: 324.0000 mg | CHEWABLE_TABLET | Freq: Every day | ORAL | Status: DC

## 2011-10-24 MED ORDER — MIDAZOLAM HCL 5 MG/5ML IJ SOLN
INTRAMUSCULAR | Status: DC | PRN
  Administered 2011-10-24: 2 mg via INTRAVENOUS
  Administered 2011-10-24: 3 mg via INTRAVENOUS
  Administered 2011-10-24 (×2): 2 mg via INTRAVENOUS
  Administered 2011-10-24: 1 mg via INTRAVENOUS

## 2011-10-24 MED ORDER — ASPIRIN EC 325 MG PO TBEC
325.0000 mg | DELAYED_RELEASE_TABLET | Freq: Every day | ORAL | Status: DC
  Administered 2011-10-25 – 2011-10-28 (×4): 325 mg via ORAL
  Filled 2011-10-24 (×4): qty 1

## 2011-10-24 MED ORDER — BISACODYL 5 MG PO TBEC
10.0000 mg | DELAYED_RELEASE_TABLET | Freq: Every day | ORAL | Status: DC
  Administered 2011-10-25 – 2011-10-27 (×3): 10 mg via ORAL
  Filled 2011-10-24 (×3): qty 2

## 2011-10-24 MED ORDER — PROPOFOL 10 MG/ML IV BOLUS
INTRAVENOUS | Status: DC | PRN
  Administered 2011-10-24: 50 mg via INTRAVENOUS

## 2011-10-24 MED ORDER — CALCIUM CHLORIDE 10 % IV SOLN
1.0000 g | Freq: Once | INTRAVENOUS | Status: AC | PRN
  Filled 2011-10-24: qty 10

## 2011-10-24 MED ORDER — SODIUM CHLORIDE 0.45 % IV SOLN
INTRAVENOUS | Status: DC
  Administered 2011-10-24: 14:00:00 via INTRAVENOUS

## 2011-10-24 MED ORDER — HEMOSTATIC AGENTS (NO CHARGE) OPTIME
TOPICAL | Status: DC | PRN
  Administered 2011-10-24: 1 via TOPICAL

## 2011-10-24 MED ORDER — MIDAZOLAM HCL 2 MG/2ML IJ SOLN
2.0000 mg | INTRAMUSCULAR | Status: DC | PRN
  Administered 2011-10-25 (×2): 2 mg via INTRAVENOUS
  Filled 2011-10-24 (×2): qty 2

## 2011-10-24 MED ORDER — ALBUMIN HUMAN 5 % IV SOLN
250.0000 mL | INTRAVENOUS | Status: AC | PRN
  Administered 2011-10-24 (×2): 250 mL via INTRAVENOUS

## 2011-10-24 MED ORDER — MORPHINE SULFATE 2 MG/ML IJ SOLN: 1.0000 mg | INTRAMUSCULAR | Status: AC | PRN

## 2011-10-24 MED ORDER — DEXTROSE 5 % IV SOLN
1.5000 g | Freq: Two times a day (BID) | INTRAVENOUS | Status: AC
  Administered 2011-10-24 – 2011-10-25 (×4): 1.5 g via INTRAVENOUS
  Filled 2011-10-24 (×4): qty 1.5

## 2011-10-24 MED ORDER — INSULIN ASPART 100 UNIT/ML ~~LOC~~ SOLN: 0.0000 [IU] | SUBCUTANEOUS | Status: DC

## 2011-10-24 SURGICAL SUPPLY — 125 items
ADAPTER CARDIO PERF ANTE/RETRO (ADAPTER) IMPLANT
APPLIER CLIP 9.375 MED OPEN (MISCELLANEOUS)
APPLIER CLIP 9.375 SM OPEN (CLIP)
ATTRACTOMAT 16X20 MAGNETIC DRP (DRAPES) ×3 IMPLANT
BAG DECANTER FOR FLEXI CONT (MISCELLANEOUS) ×3 IMPLANT
BANDAGE ELASTIC 4 VELCRO ST LF (GAUZE/BANDAGES/DRESSINGS) ×3 IMPLANT
BANDAGE ELASTIC 6 VELCRO ST LF (GAUZE/BANDAGES/DRESSINGS) IMPLANT
BANDAGE GAUZE ELAST BULKY 4 IN (GAUZE/BANDAGES/DRESSINGS) ×3 IMPLANT
BASKET HEART (ORDER IN 25'S) (MISCELLANEOUS) ×1
BASKET HEART (ORDER IN 25S) (MISCELLANEOUS) ×2 IMPLANT
BENZOIN TINCTURE PRP APPL 2/3 (GAUZE/BANDAGES/DRESSINGS) ×3 IMPLANT
BLADE STERNUM SYSTEM 6 (BLADE) ×3 IMPLANT
BLADE SURG 11 STRL SS (BLADE) ×3 IMPLANT
BLADE SURG 15 STRL LF DISP TIS (BLADE) ×2 IMPLANT
BLADE SURG 15 STRL SS (BLADE) ×1
BLADE SURG ROTATE 9660 (MISCELLANEOUS) ×3 IMPLANT
CANISTER SUCTION 2500CC (MISCELLANEOUS) ×3 IMPLANT
CANNULA GUNDRY RCSP 15FR (MISCELLANEOUS) IMPLANT
CATH CPB KIT OWEN (MISCELLANEOUS) ×3 IMPLANT
CATH THORACIC 28FR (CATHETERS) IMPLANT
CATH THORACIC 28FR RT ANG (CATHETERS) IMPLANT
CATH THORACIC 36FR (CATHETERS) ×3 IMPLANT
CATH THORACIC 36FR RT ANG (CATHETERS) IMPLANT
CLIP APPLIE 9.375 MED OPEN (MISCELLANEOUS) IMPLANT
CLIP APPLIE 9.375 SM OPEN (CLIP) IMPLANT
CLIP FOGARTY SPRING 6M (CLIP) ×3 IMPLANT
CLIP RETRACTION 3.0MM CORONARY (MISCELLANEOUS) ×3 IMPLANT
CLIP TI MEDIUM 24 (CLIP) IMPLANT
CLIP TI WIDE RED SMALL 24 (CLIP) ×3 IMPLANT
CLOTH BEACON ORANGE TIMEOUT ST (SAFETY) ×3 IMPLANT
CONN Y 3/8X3/8X3/8  BEN (MISCELLANEOUS)
CONN Y 3/8X3/8X3/8 BEN (MISCELLANEOUS) IMPLANT
COVER MAYO STAND STRL (DRAPES) ×3 IMPLANT
COVER SURGICAL LIGHT HANDLE (MISCELLANEOUS) ×3 IMPLANT
CRADLE DONUT ADULT HEAD (MISCELLANEOUS) ×3 IMPLANT
DEVICE PMI PUNCTURE CLOSURE (MISCELLANEOUS) ×3 IMPLANT
DRAIN CHANNEL 32F RND 10.7 FF (WOUND CARE) ×6 IMPLANT
DRAPE CARDIOVASCULAR INCISE (DRAPES) ×1
DRAPE EXTREMITY T 121X128X90 (DRAPE) ×3 IMPLANT
DRAPE PROXIMA HALF (DRAPES) ×3 IMPLANT
DRAPE SLUSH/WARMER DISC (DRAPES) ×3 IMPLANT
DRAPE SRG 135X102X78XABS (DRAPES) ×2 IMPLANT
DRSG COVADERM 4X14 (GAUZE/BANDAGES/DRESSINGS) ×3 IMPLANT
ELECT REM PT RETURN 9FT ADLT (ELECTROSURGICAL) ×6
ELECTRODE REM PT RTRN 9FT ADLT (ELECTROSURGICAL) ×4 IMPLANT
GEL ULTRASOUND 20GR AQUASONIC (MISCELLANEOUS) ×3 IMPLANT
GLOVE BIO SURGEON STRL SZ 6 (GLOVE) ×6 IMPLANT
GLOVE BIO SURGEON STRL SZ 6.5 (GLOVE) ×9 IMPLANT
GLOVE BIO SURGEON STRL SZ7 (GLOVE) IMPLANT
GLOVE BIO SURGEON STRL SZ7.5 (GLOVE) ×3 IMPLANT
GLOVE BIOGEL PI IND STRL 6 (GLOVE) ×4 IMPLANT
GLOVE BIOGEL PI IND STRL 6.5 (GLOVE) ×12 IMPLANT
GLOVE BIOGEL PI IND STRL 7.0 (GLOVE) ×2 IMPLANT
GLOVE BIOGEL PI INDICATOR 6 (GLOVE) ×2
GLOVE BIOGEL PI INDICATOR 6.5 (GLOVE) ×6
GLOVE BIOGEL PI INDICATOR 7.0 (GLOVE) ×1
GLOVE EUDERMIC 7 POWDERFREE (GLOVE) IMPLANT
GLOVE ORTHO TXT STRL SZ7.5 (GLOVE) ×6 IMPLANT
GOWN PREVENTION PLUS XLARGE (GOWN DISPOSABLE) ×9 IMPLANT
GOWN STRL NON-REIN LRG LVL3 (GOWN DISPOSABLE) ×18 IMPLANT
HARMONIC SHEARS 14CM COAG (MISCELLANEOUS) ×3 IMPLANT
HEMOSTAT POWDER SURGIFOAM 1G (HEMOSTASIS) ×9 IMPLANT
INSERT FOGARTY 61MM (MISCELLANEOUS) ×3 IMPLANT
INSERT FOGARTY XLG (MISCELLANEOUS) ×3 IMPLANT
IV CATH 22GX1 FEP (IV SOLUTION) ×3 IMPLANT
KIT BASIN OR (CUSTOM PROCEDURE TRAY) ×3 IMPLANT
KIT ROOM TURNOVER OR (KITS) ×3 IMPLANT
KIT SUCTION CATH 14FR (SUCTIONS) ×15 IMPLANT
KIT VASOVIEW W/TROCAR VH 2000 (KITS) ×3 IMPLANT
LEAD PACING MYOCARDI (MISCELLANEOUS) ×3 IMPLANT
MARKER GRAFT CORONARY BYPASS (MISCELLANEOUS) ×9 IMPLANT
NS IRRIG 1000ML POUR BTL (IV SOLUTION) ×18 IMPLANT
PACK OPEN HEART (CUSTOM PROCEDURE TRAY) ×3 IMPLANT
PAD ARMBOARD 7.5X6 YLW CONV (MISCELLANEOUS) ×9 IMPLANT
PENCIL BUTTON HOLSTER BLD 10FT (ELECTRODE) ×3 IMPLANT
PUNCH AORTIC ROTATE 4.0MM (MISCELLANEOUS) ×3 IMPLANT
PUNCH AORTIC ROTATE 4.5MM 8IN (MISCELLANEOUS) IMPLANT
PUNCH AORTIC ROTATE 5MM 8IN (MISCELLANEOUS) IMPLANT
SET CARDIOPLEGIA MPS 5001102 (MISCELLANEOUS) ×3 IMPLANT
SOLUTION ANTI FOG 6CC (MISCELLANEOUS) IMPLANT
SPONGE GAUZE 4X4 12PLY (GAUZE/BANDAGES/DRESSINGS) ×9 IMPLANT
SPONGE LAP 18X18 X RAY DECT (DISPOSABLE) ×9 IMPLANT
SPONGE LAP 4X18 X RAY DECT (DISPOSABLE) ×3 IMPLANT
SUT BONE WAX W31G (SUTURE) ×3 IMPLANT
SUT ETHIBOND X763 2 0 SH 1 (SUTURE) ×6 IMPLANT
SUT MNCRL AB 3-0 PS2 18 (SUTURE) ×6 IMPLANT
SUT MNCRL AB 4-0 PS2 18 (SUTURE) ×3 IMPLANT
SUT PDS AB 1 CTX 36 (SUTURE) ×6 IMPLANT
SUT PROLENE 2 0 SH DA (SUTURE) IMPLANT
SUT PROLENE 3 0 SH DA (SUTURE) ×3 IMPLANT
SUT PROLENE 3 0 SH1 36 (SUTURE) IMPLANT
SUT PROLENE 4 0 RB 1 (SUTURE)
SUT PROLENE 4 0 SH DA (SUTURE) IMPLANT
SUT PROLENE 4-0 RB1 .5 CRCL 36 (SUTURE) IMPLANT
SUT PROLENE 5 0 C 1 36 (SUTURE) IMPLANT
SUT PROLENE 6 0 C 1 30 (SUTURE) ×15 IMPLANT
SUT PROLENE 7.0 RB 3 (SUTURE) ×9 IMPLANT
SUT PROLENE 8 0 BV175 6 (SUTURE) ×15 IMPLANT
SUT PROLENE BLUE 7 0 (SUTURE) ×3 IMPLANT
SUT PROLENE POLY MONO (SUTURE) IMPLANT
SUT SILK  1 MH (SUTURE) ×2
SUT SILK 1 MH (SUTURE) ×4 IMPLANT
SUT STEEL 6MS V (SUTURE) IMPLANT
SUT STEEL STERNAL CCS#1 18IN (SUTURE) ×3 IMPLANT
SUT STEEL SZ 6 DBL 3X14 BALL (SUTURE) ×6 IMPLANT
SUT VIC AB 1 CTX 36 (SUTURE)
SUT VIC AB 1 CTX36XBRD ANBCTR (SUTURE) IMPLANT
SUT VIC AB 2-0 CT1 27 (SUTURE) ×2
SUT VIC AB 2-0 CT1 TAPERPNT 27 (SUTURE) ×4 IMPLANT
SUT VIC AB 2-0 CTX 27 (SUTURE) IMPLANT
SUT VIC AB 3-0 SH 27 (SUTURE) ×1
SUT VIC AB 3-0 SH 27X BRD (SUTURE) ×2 IMPLANT
SUT VIC AB 3-0 X1 27 (SUTURE) ×12 IMPLANT
SUT VICRYL 4-0 PS2 18IN ABS (SUTURE) IMPLANT
SUTURE E-PAK OPEN HEART (SUTURE) ×3 IMPLANT
SYR 50ML SLIP (SYRINGE) IMPLANT
SYSTEM SAHARA CHEST DRAIN ATS (WOUND CARE) ×6 IMPLANT
TAPE CLOTH SURG 4X10 WHT LF (GAUZE/BANDAGES/DRESSINGS) ×3 IMPLANT
TOWEL OR 17X24 6PK STRL BLUE (TOWEL DISPOSABLE) ×6 IMPLANT
TOWEL OR 17X26 10 PK STRL BLUE (TOWEL DISPOSABLE) ×6 IMPLANT
TRAY FOLEY IC TEMP SENS 14FR (CATHETERS) ×3 IMPLANT
TUBE SUCT INTRACARD DLP 20F (MISCELLANEOUS) ×3 IMPLANT
TUBING INSUFFLATION 10FT LAP (TUBING) ×3 IMPLANT
UNDERPAD 30X30 INCONTINENT (UNDERPADS AND DIAPERS) ×6 IMPLANT
WATER STERILE IRR 1000ML POUR (IV SOLUTION) ×6 IMPLANT

## 2011-10-24 NOTE — Addendum Note (Signed)
Addendum  created 10/24/11 1511 by Coralee Rud, CRNA   Modules edited:Anesthesia Medication Administration

## 2011-10-24 NOTE — Procedures (Signed)
Extubation Procedure Note  Patient Details:   Name: Willie [name] DOB: 1965/08/31 MRN: [id number]   Airway Documentation:     Evaluation  O2 sats: stable throughout and currently acceptable Complications: No apparent complications Patient did tolerate procedure well. Bilateral Breath Sounds: Clear;Diminished   Yes  NIF -28, VC 1.0  Antoine Poche 10/24/2011, 6:39 PM

## 2011-10-24 NOTE — Progress Notes (Signed)
Charge nurse just reported that pt tested positive for staph and negative for MRSA  Willie [name] M

## 2011-10-24 NOTE — Transfer of Care (Signed)
Immediate Anesthesia Transfer of Care Note  Patient: Willie [name]  Procedure(s) Performed: Procedure(s) (LRB) with comments: CORONARY ARTERY BYPASS GRAFTING (CABG) (N/A) - Coronary Artery Bypass Grafting X 3 Using Left Internal  Mammary Artery, Right Internal Mammary Artery and Right Radial Artery RADIAL ARTERY HARVEST (Left)  Patient Location: SICU  Anesthesia Type: General  Level of Consciousness: unresponsive and Patient remains intubated per anesthesia plan  Airway & Oxygen Therapy: Patient remains intubated per anesthesia plan and Patient placed on Ventilator (see vital sign flow sheet for setting)  Post-op Assessment: Report given to PACU RN and Post -op Vital signs reviewed and stable  Post vital signs: Reviewed and stable  Complications: No apparent anesthesia complications

## 2011-10-24 NOTE — Anesthesia Preprocedure Evaluation (Addendum)
Anesthesia Evaluation  Patient identified by MRN, date of birth, ID band Patient awake    Reviewed: Allergy & Precautions, H&P , NPO status , Patient's Chart, lab work & pertinent test results, reviewed documented beta blocker date and time   History of Anesthesia Complications Negative for: history of anesthetic complications  Airway Mallampati: II TM Distance: >3 FB Neck ROM: Full    Dental  (+) Teeth Intact and Dental Advisory Given   Pulmonary neg pulmonary ROS, former smoker,  H/O Hodgkins Lymphoma with Chest DXR   Pulmonary exam normal       Cardiovascular hypertension, Pt. on medications and Pt. on home beta blockers + angina with exertion and at rest + CAD Rhythm:Regular Rate:Normal     Neuro/Psych negative neurological ROS  negative psych ROS   GI/Hepatic negative GI ROS, Neg liver ROS,   Endo/Other  negative endocrine ROS  Renal/GU negative Renal ROS     Musculoskeletal   Abdominal Normal abdominal exam  (+)   Peds  Hematology negative hematology ROS (+)   Anesthesia Other Findings   Reproductive/Obstetrics                         Anesthesia Physical Anesthesia Plan  ASA: III  Anesthesia Plan: General   Post-op Pain Management:    Induction: Intravenous  Airway Management Planned: Oral ETT  Additional Equipment: Arterial line, CVP, PA Cath, 3D TEE and Ultrasound Guidance Line Placement  Intra-op Plan:   Post-operative Plan: Post-operative intubation/ventilation  Informed Consent: I have reviewed the patients History and Physical, chart, labs and discussed the procedure including the risks, benefits and alternatives for the proposed anesthesia with the patient or authorized representative who has indicated his/her understanding and acceptance.   Dental advisory given  Plan Discussed with: CRNA and Surgeon  Anesthesia Plan Comments:        Anesthesia Quick  Evaluation

## 2011-10-24 NOTE — Op Note (Signed)
CARDIOTHORACIC SURGERY OPERATIVE NOTE  Date of Procedure: 10/24/2011  Preoperative Diagnosis:   Severe Left Main Coronary Artery Disease  Postoperative Diagnosis:   Same  Procedure:   Coronary Artery Bypass Grafting x 3   Left Internal Mammary Artery to Distal Left Anterior Descending Coronary Artery  Right Internal Mammary Artery to Distal Right Coronary Artery  Left Radial Artery to the Second Obtuse Marginal Branch of Left Circumflex Coronary Artery  Surgeon: Salvatore Decent. Cornelius Moras, MD  Assistant: Doree Fudge, PA-C and Gershon Crane, [place]  Anesthesia: Arta Bruce, MD  Operative Findings:  Moderate left ventricular systolic dysfunction (EF 30%)  Good quality left internal mammary artery conduit  Good quality right internal mammary artery conduit  Good quality left radial artery conduit  Intramyocardial left anterior descending coronary artery  Good quality target vessels for grafting      BRIEF CLINICAL NOTE AND INDICATIONS FOR SURGERY  This is a 46 year old Caucasian male who had complaints of chest pressure and shortness of breath with exertion. 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  A Myoview stress test and an echocardiogram gram were done. The echocardiogram apparently showed moderate left ventricular dysfunction (LVEF of 30%), mild left atrial enlargement, and moderate mitral and tricuspid insufficiency. The Myoview  stress test showed no evidence of myocardial ischemia .Because of the patient's symptoms of chest pressure with exertion, it was recommended that he undergo a cardiac catheterization. He underwent catheterization at [facility] on 10/21/2011 and was found to have critical left main coronary artery disease. He was accepted in transfer to [facility] on 10/22/2011.  The patient has been seen in  consultation and counseled at length regarding the indications, risks and potential benefits of surgery.  All questions have been answered, and the patient provides full informed consent for the operation as described. .    DETAILS OF THE OPERATIVE PROCEDURE  The patient is brought to the operating room on the above mentioned date and central monitoring was established by the anesthesia team including placement of Swan-Ganz catheter and radial arterial line. The patient is placed in the supine position on the operating table.  Intravenous antibiotics are administered. General endotracheal anesthesia is induced uneventfully. A Foley catheter is placed.  Baseline transesophageal echocardiogram was performed.  Findings were notable for global LV systolic dysfunction with EF estimated 30%.  There was trace to mild mitral regurgitation.  The patient's chest, abdomen, both groins, left upper extremity and both lower extremities are prepared and draped in a sterile manner. A time out procedure is performed.  A median sternotomy incision was performed and the left internal mammary artery is dissected from the chest wall and prepared for bypass grafting. The left internal mammary artery is notably good quality conduit. Simultaneously, the left radial artery is harvested from the volar aspect of the left forearm using open harvest technique with the harmonic scalpel to divide all intervening side branches of the radial artery and associated veins. The radial artery is notably good quality conduit.  The right internal mammary artery is then dissected from the chest wall and prepared for bypass grafting. Following systemic heparinization, both internal mammary arteries were transected distally noted to have excellent flow.  The left radial artery was removed from the forearm and the incision closed in routine fashion.  The radial artery conduit was temporarily stored in heparinized blood.  The pericardium  is  opened. The ascending aorta is normal in appearance. The ascending aorta and the right atrium are cannulated for cardioplegia bypass.  Adequate heparinization is verified.    The entire pre-bypass portion of the operation was notable for stable hemodynamics.  Cardiopulmonary bypass was begun and the surface of the heart is inspected. Distal target vessels are selected for coronary artery bypass grafting. A cardioplegia cannula is placed in the ascending aorta.  A temperature probe was placed in the interventricular septum.  The patient is allowed to cool passively to [facility] systemic temperature.  The aortic cross clamp is applied and cold blood cardioplegia is delivered initially in an antegrade fashion through the aortic root.  Iced saline slush is applied for topical hypothermia.  The initial cardioplegic arrest is rapid with early diastolic arrest.  Repeat doses of cardioplegia are administered intermittently throughout the entire cross clamp portion of the operation through the aortic root and through subsequently placed vein grafts in order to maintain completely flat electrocardiogram and septal myocardial temperature below 15C.  Myocardial protection was felt to be excellent.  The following distal coronary artery bypass grafts were performed:   The distal right coronary artery was grafted using the right internal mammary artery as an in-situ graft in an end-to-side fashion.  At the site of distal anastomosis the target vessel was fair to good quality and measured approximately 2.2 mm in diameter.  The second obtuse marginal branch of the left circumflex coronary artery was grafted using the left radial artery in an end-to-side fashion.  At the site of distal anastomosis the target vessel was good quality and measured approximately 2.0 mm in diameter.  The first obtuse marginal branch was too small for grafting.  The distal left anterior coronary artery was grafted with the left internal mammary  artery in an end-to-side fashion.  At the site of distal anastomosis the target vessel was intramyocardial but good quality and measured approximately 2.0 mm in diameter.   The proximal radial artery graft anastomoses was placed directly to the ascending aorta prior to removal of the aortic cross clamp.  The septal myocardial temperature rose rapidly after reperfusion of the left internal mammary artery graft.  The aortic cross clamp was removed after a total cross clamp time of 92 minutes.  All proximal and distal coronary anastomoses were inspected for hemostasis and appropriate graft orientation. Epicardial pacing wires are fixed to the right ventricular outflow tract and to the right atrial appendage. The patient is rewarmed to 37C temperature. The patient is weaned and disconnected from cardiopulmonary bypass.  The patient's rhythm at separation from bypass was AV paced.  The patient was weaned from cardioplegic bypass without any inotropic support. Total cardiopulmonary bypass time for the operation was 116 minutes.  Followup transesophageal echocardiogram performed after separation from bypass revealed no changes from the preoperative exam.  The aortic and venous cannula were removed uneventfully. Protamine was administered to reverse the anticoagulation. The mediastinum and pleural space were inspected for hemostasis and irrigated with saline solution. The mediastinum and both pleural spaces were drained using 4 chest tubes placed through separate stab incisions inferiorly.  The soft tissues anterior to the aorta were reapproximated loosely. The sternum is closed with double strength sternal wire. The soft tissues anterior to the sternum were closed in multiple layers and the skin is closed with a running subcuticular skin closure.  The post-bypass portion of the operation was notable for stable rhythm and hemodynamics.  No blood products were administered  during the operation.  The patient  tolerated the procedure well and is transported to the surgical intensive care in stable condition. There are no intraoperative complications. All sponge instrument and needle counts are verified correct at completion of the operation.    Salvatore Decent. Cornelius Moras MD 10/24/2011 1:22 PM

## 2011-10-24 NOTE — Addendum Note (Signed)
Addendum  created 10/24/11 1510 by Coralee Rud, CRNA   Modules edited:Anesthesia Medication Administration

## 2011-10-24 NOTE — Progress Notes (Signed)
  Echocardiogram Echocardiogram Transesophageal has been performed.  Amin Fornwalt 10/24/2011, 8:30 AM

## 2011-10-24 NOTE — Progress Notes (Signed)
S/p CABG x 3  Just extubated  BP 107/66  Pulse 90  Temp 99 F (37.2 C) (Oral)  Resp 15  Ht 5\' 8"  (1.727 m)  Wt 197 lb (89.359 kg)  BMI 29.95 kg/m2  SpO2 99%   Intake/Output Summary (Last 24 hours) at 10/24/11 1731 Last data filed at 10/24/11 1715  Gross per 24 hour  Intake 4728.48 ml  Output   5978 ml  Net -1249.52 ml   ABG preextubation 7.37/38/95/-3  CT 20-100/ hour  Stable early postop

## 2011-10-24 NOTE — Anesthesia Postprocedure Evaluation (Signed)
Anesthesia Post Note  Patient: Willie [name]  Procedure(s) Performed: Procedure(s) (LRB): CORONARY ARTERY BYPASS GRAFTING (CABG) (N/A) RADIAL ARTERY HARVEST (Left)  Anesthesia type: General  Patient location: ICU  Post pain: Pain level controlled  Post assessment: Post-op Vital signs reviewed  Last Vitals:  Filed Vitals:   10/24/11 1415  BP:   Pulse: 86  Temp: 35.5 C  Resp: 12    Post vital signs: stable  Level of consciousness: Patient remains intubated per anesthesia plan  Complications: No apparent anesthesia complications

## 2011-10-24 NOTE — OR Nursing (Signed)
1235 first call made to SICU, 1320 second call made to SICU

## 2011-10-25 ENCOUNTER — Inpatient Hospital Stay (HOSPITAL_COMMUNITY): Payer: 59

## 2011-10-25 LAB — CBC
MCV: 90.1 fL (ref 78.0–100.0)
Platelets: 174 10*3/uL (ref 150–400)
Platelets: 156 10*3/uL (ref 150–400)
RBC: 4.23 MIL/uL (ref 4.22–5.81)
RDW: 13.2 % (ref 11.5–15.5)
RDW: 13.5 % (ref 11.5–15.5)
WBC: 21.1 10*3/uL — ABNORMAL HIGH (ref 4.0–10.5)
WBC: 19.5 10*3/uL — ABNORMAL HIGH (ref 4.0–10.5)

## 2011-10-25 LAB — BASIC METABOLIC PANEL
Chloride: 108 mEq/L (ref 96–112)
Creatinine, Ser: 1.22 mg/dL (ref 0.50–1.35)
GFR calc Af Amer: 79 mL/min — ABNORMAL LOW (ref >90)
GFR calc non Af Amer: 69 mL/min — ABNORMAL LOW (ref >90)
Potassium: 4.6 mEq/L (ref 3.5–5.1)

## 2011-10-25 LAB — CREATININE, SERUM
Creatinine, Ser: 1.5 mg/dL — ABNORMAL HIGH (ref 0.50–1.35)
GFR calc Af Amer: 62 mL/min — ABNORMAL LOW (ref >90)

## 2011-10-25 LAB — POCT I-STAT, CHEM 8
BUN: 15 mg/dL (ref 6–23)
Calcium, Ion: 1.13 mmol/L (ref 1.12–1.23)
Chloride: 103 mEq/L (ref 96–112)
Creatinine, Ser: 1.4 mg/dL — ABNORMAL HIGH (ref 0.50–1.35)
Glucose, Bld: 134 mg/dL — ABNORMAL HIGH (ref 70–99)
Potassium: 4.8 mEq/L (ref 3.5–5.1)

## 2011-10-25 LAB — GLUCOSE, CAPILLARY: Glucose-Capillary: 130 mg/dL — ABNORMAL HIGH (ref 70–99)

## 2011-10-25 LAB — MAGNESIUM: Magnesium: 2.9 mg/dL — ABNORMAL HIGH (ref 1.5–2.5)

## 2011-10-25 MED ORDER — INSULIN ASPART 100 UNIT/ML ~~LOC~~ SOLN
0.0000 [IU] | SUBCUTANEOUS | Status: DC
  Administered 2011-10-25 – 2011-10-26 (×3): 2 [IU] via SUBCUTANEOUS

## 2011-10-25 MED ORDER — ENOXAPARIN SODIUM 40 MG/0.4ML ~~LOC~~ SOLN
40.0000 mg | SUBCUTANEOUS | Status: DC
  Filled 2011-10-25: qty 0.4

## 2011-10-25 MED ORDER — FUROSEMIDE 10 MG/ML IJ SOLN
20.0000 mg | Freq: Once | INTRAMUSCULAR | Status: AC
  Administered 2011-10-25: 20 mg via INTRAVENOUS
  Filled 2011-10-25: qty 2

## 2011-10-25 MED ORDER — ISOSORBIDE MONONITRATE ER 30 MG PO TB24
30.0000 mg | ORAL_TABLET | Freq: Every day | ORAL | Status: DC
  Administered 2011-10-25 – 2011-10-26 (×2): 30 mg via ORAL
  Filled 2011-10-25 (×3): qty 1

## 2011-10-25 NOTE — Progress Notes (Signed)
Stable day  BP 109/73  Pulse 110  Temp 99.4 F (37.4 C) (Oral)  Resp 25  Ht 5\' 8"  (1.727 m)  Wt 208 lb 8.9 oz (94.6 kg)  BMI 31.71 kg/m2  SpO2 95%   Intake/Output Summary (Last 24 hours) at 10/25/11 1810 Last data filed at 10/25/11 1800  Gross per 24 hour  Intake 2119.7 ml  Output   2730 ml  Net -610.3 ml    Creatinine up to 1.4 from 1.2- good UOP, recheck in AM, lytes OK

## 2011-10-25 NOTE — Progress Notes (Signed)
1 Day Post-Op Procedure(s) (LRB): CORONARY ARTERY BYPASS GRAFTING (CABG) (N/A) RADIAL ARTERY HARVEST (Left) Subjective: Some incisional pain  Objective: Vital signs in last 24 hours: Temp:  [95.9 F (35.5 C)-99.5 F (37.5 C)] 99.5 F (37.5 C) (10/19 0700) Pulse Rate:  [84-101] 88  (10/19 0700) Cardiac Rhythm:  [-] Heart block (10/19 0747) Resp:  [12-33] 25  (10/19 0700) BP: (87-107)/(49-66) 89/55 mmHg (10/19 0700) SpO2:  [93 %-100 %] 97 % (10/19 0700) FiO2 (%):  [40 %-50 %] 40 % (10/18 1659) Weight:  [208 lb 8.9 oz (94.6 kg)] 208 lb 8.9 oz (94.6 kg) (10/19 0600)  Hemodynamic parameters for last 24 hours: PAP: (21-46)/(12-30) 40/24 mmHg CO:  [4.1 L/min-5.5 L/min] 4.5 L/min CI:  [2 L/min/m2-2.7 L/min/m2] 2.2 L/min/m2  Intake/Output from previous day: 10/18 0701 - 10/19 0700 In: 6766.7 [P.O.:360; I.V.:4206.7; IV Piggyback:2200] Out: 1610 [Urine:5575; Blood:1400; Chest Tube:700] Intake/Output this shift: Total I/O In: -  Out: 75 [Urine:75]  General appearance: alert and no distress Neurologic: intact Heart: regular rate and rhythm Lungs: clear to auscultation bilaterally  Lab Results:  Basename 10/25/11 0400 10/24/11 2014 10/24/11 2000  WBC 21.1* -- 18.9*  HGB 12.7* 13.3 --  HCT 36.9* 39.0 --  PLT 174 -- 170   BMET:  Basename 10/25/11 0400 10/24/11 2014 10/24/11 0510  NA 140 143 --  K 4.6 4.4 --  CL 108 108 --  CO2 22 -- 26  GLUCOSE 123* 76 --  BUN 12 13 --  CREATININE 1.22 1.20 --  CALCIUM 7.8* -- 9.6    PT/INR:  Basename 10/24/11 1357  LABPROT 22.0*  INR 2.01*   ABG    Component Value Date/Time   PHART 7.318* 10/24/2011 1805   HCO3 21.7 10/24/2011 1805   TCO2 22 10/24/2011 2014   ACIDBASEDEF 4.0* 10/24/2011 1805   O2SAT 97.0 10/24/2011 1805   CBG (last 3)   Basename 10/25/11 0343 10/25/11 0156 10/24/11 2324  GLUCAP 106* 103* 130*    Assessment/Plan: S/P Procedure(s) (LRB): CORONARY ARTERY BYPASS GRAFTING (CABG) (N/A) RADIAL ARTERY HARVEST  (Left) POD # 1 CV- stable, d/c swan  Imdur for radial graft, d/c NTG gtt  RESP- pulmonary hygiene  RENAL- lytes, creatinine OK, diurese  CBG well controlled  D/C A line  D/C CT  Lovenox for dvt prophylaxis   LOS: 3 days    Willie [name] C 10/25/2011

## 2011-10-26 ENCOUNTER — Inpatient Hospital Stay (HOSPITAL_COMMUNITY): Payer: 59

## 2011-10-26 LAB — GLUCOSE, CAPILLARY
Glucose-Capillary: 154 mg/dL — ABNORMAL HIGH (ref 70–99)
Glucose-Capillary: 123 mg/dL — ABNORMAL HIGH (ref 70–99)
Glucose-Capillary: 115 mg/dL — ABNORMAL HIGH (ref 70–99)

## 2011-10-26 LAB — BASIC METABOLIC PANEL
BUN: 14 mg/dL (ref 6–23)
CO2: 27 mEq/L (ref 19–32)
Calcium: 8.1 mg/dL — ABNORMAL LOW (ref 8.4–10.5)
Chloride: 102 mEq/L (ref 96–112)
Creatinine, Ser: 1.19 mg/dL (ref 0.50–1.35)
Glucose, Bld: 155 mg/dL — ABNORMAL HIGH (ref 70–99)

## 2011-10-26 LAB — CBC
HCT: 33.8 % — ABNORMAL LOW (ref 39.0–52.0)
MCH: 30.7 pg (ref 26.0–34.0)
MCHC: 34.3 g/dL (ref 30.0–36.0)
MCV: 89.4 fL (ref 78.0–100.0)
Platelets: 139 10*3/uL — ABNORMAL LOW (ref 150–400)
RDW: 13.4 % (ref 11.5–15.5)
WBC: 19.4 10*3/uL — ABNORMAL HIGH (ref 4.0–10.5)

## 2011-10-26 MED ORDER — HYDROCHLOROTHIAZIDE 12.5 MG PO CAPS
12.5000 mg | ORAL_CAPSULE | Freq: Every day | ORAL | Status: DC
  Administered 2011-10-26 – 2011-10-28 (×3): 12.5 mg via ORAL
  Filled 2011-10-26 (×3): qty 1

## 2011-10-26 MED ORDER — METOPROLOL TARTRATE 25 MG/10 ML ORAL SUSPENSION
25.0000 mg | Freq: Two times a day (BID) | ORAL | Status: DC
  Filled 2011-10-26 (×3): qty 10

## 2011-10-26 MED ORDER — LISINOPRIL 10 MG PO TABS
10.0000 mg | ORAL_TABLET | Freq: Every day | ORAL | Status: DC
  Administered 2011-10-26 – 2011-10-28 (×3): 10 mg via ORAL
  Filled 2011-10-26 (×3): qty 1

## 2011-10-26 MED ORDER — POLYETHYLENE GLYCOL 3350 17 G PO PACK
17.0000 g | PACK | Freq: Once | ORAL | Status: AC
  Administered 2011-10-26: 17 g via ORAL
  Filled 2011-10-26: qty 1

## 2011-10-26 MED ORDER — METOCLOPRAMIDE HCL 10 MG PO TABS
10.0000 mg | ORAL_TABLET | Freq: Three times a day (TID) | ORAL | Status: AC
  Administered 2011-10-26 (×3): 10 mg via ORAL
  Filled 2011-10-26 (×5): qty 1

## 2011-10-26 MED ORDER — INSULIN ASPART 100 UNIT/ML ~~LOC~~ SOLN: 0.0000 [IU] | Freq: Three times a day (TID) | SUBCUTANEOUS | Status: DC

## 2011-10-26 MED ORDER — LISINOPRIL-HYDROCHLOROTHIAZIDE 10-12.5 MG PO TABS: 0.5000 | ORAL_TABLET | Freq: Every day | ORAL | Status: DC

## 2011-10-26 MED ORDER — METOPROLOL TARTRATE 25 MG PO TABS
25.0000 mg | ORAL_TABLET | Freq: Two times a day (BID) | ORAL | Status: DC
  Administered 2011-10-26: 25 mg via ORAL
  Filled 2011-10-26 (×4): qty 1

## 2011-10-26 NOTE — Progress Notes (Signed)
2 Days Post-Op Procedure(s) (LRB): CORONARY ARTERY BYPASS GRAFTING (CABG) (N/A) RADIAL ARTERY HARVEST (Left) Subjective: Feels well overall. Feels like he needs to have a BM, has chronic constipation at home- uses sennakot Pain well controlled   Objective: Vital signs in last 24 hours: Temp:  [98 F (36.7 C)-99.5 F (37.5 C)] 98.8 F (37.1 C) (10/20 0753) Pulse Rate:  [94-118] 96  (10/20 0600) Cardiac Rhythm:  [-] Heart block (10/20 0600) Resp:  [13-34] 20  (10/20 0600) BP: (94-138)/(58-119) 104/83 mmHg (10/20 0600) SpO2:  [90 %-98 %] 94 % (10/20 0600) Weight:  [205 lb 11 oz (93.3 kg)] 205 lb 11 oz (93.3 kg) (10/20 0500)  Hemodynamic parameters for last 24 hours: PAP: (42)/(24) 42/24 mmHg  Intake/Output from previous day: 10/19 0701 - 10/20 0700 In: 890 [P.O.:360; I.V.:430; IV Piggyback:100] Out: 1630 [Urine:1480; Chest Tube:150] Intake/Output this shift:    General appearance: alert and no distress Neurologic: intact Heart: regular rate and rhythm Lungs: diminished breath sounds bibasilar Abdomen: distended, tympanitic, nontender Wound: clean and dry  Lab Results:  Basename 10/26/11 0427 10/25/11 1624 10/25/11 1620  WBC 19.4* -- 19.5*  HGB 11.6* 13.3 --  HCT 33.8* 39.0 --  PLT 139* -- 156   BMET:  Basename 10/26/11 0427 10/25/11 1624 10/25/11 0400  NA 137 140 --  K 4.7 4.8 --  CL 102 103 --  CO2 27 -- 22  GLUCOSE 155* 134* --  BUN 14 15 --  CREATININE 1.19 1.40* --  CALCIUM 8.1* -- 7.8*    PT/INR:  Basename 10/24/11 1357  LABPROT 22.0*  INR 2.01*   ABG    Component Value Date/Time   PHART 7.318* 10/24/2011 1805   HCO3 21.7 10/24/2011 1805   TCO2 24 10/25/2011 1624   ACIDBASEDEF 4.0* 10/24/2011 1805   O2SAT 97.0 10/24/2011 1805   CBG (last 3)   Basename 10/26/11 0752 10/26/11 0041 10/25/11 2005  GLUCAP 132* 141* 159*    Assessment/Plan: S/P Procedure(s) (LRB): CORONARY ARTERY BYPASS GRAFTING (CABG) (N/A) RADIAL ARTERY HARVEST  (Left) Plan for transfer to step-down: see transfer orders CV- stable s/p CABG x 3. Imdur for radial graft RESP- pulmonary hygiene RENAL- lytes, creatinine OK GI- likely has mild ileus- will give reglan x 24 hours and miralax x 1 Continue ambulation Transfer to 2000   LOS: 4 days    HENDRICKSON,STEVEN C 10/26/2011

## 2011-10-27 LAB — GLUCOSE, CAPILLARY
Glucose-Capillary: 111 mg/dL — ABNORMAL HIGH (ref 70–99)
Glucose-Capillary: 102 mg/dL — ABNORMAL HIGH (ref 70–99)

## 2011-10-27 MED ORDER — ATORVASTATIN CALCIUM 80 MG PO TABS
80.0000 mg | ORAL_TABLET | Freq: Every day | ORAL | Status: DC
  Administered 2011-10-27: 80 mg via ORAL
  Filled 2011-10-27 (×2): qty 1

## 2011-10-27 MED ORDER — METOPROLOL TARTRATE 50 MG PO TABS
50.0000 mg | ORAL_TABLET | Freq: Two times a day (BID) | ORAL | Status: DC
  Administered 2011-10-27: 50 mg via ORAL
  Filled 2011-10-27: qty 1

## 2011-10-27 MED ORDER — METOPROLOL TARTRATE 25 MG PO TABS
25.0000 mg | ORAL_TABLET | Freq: Two times a day (BID) | ORAL | Status: DC
  Administered 2011-10-27 – 2011-10-28 (×2): 25 mg via ORAL
  Filled 2011-10-27 (×3): qty 1

## 2011-10-27 MED ORDER — ASPIRIN 325 MG PO TBEC: 325.0000 mg | DELAYED_RELEASE_TABLET | Freq: Every day | ORAL | Status: DC

## 2011-10-27 MED ORDER — METOPROLOL TARTRATE 25 MG PO TABS: 25.0000 mg | ORAL_TABLET | Freq: Two times a day (BID) | ORAL | Status: DC

## 2011-10-27 MED ORDER — OXYCODONE HCL 5 MG PO TABS: 5.0000 mg | ORAL_TABLET | ORAL | Status: DC | PRN

## 2011-10-27 MED ORDER — MAGNESIUM HYDROXIDE 400 MG/5ML PO SUSP
30.0000 mL | Freq: Every day | ORAL | Status: DC | PRN
  Administered 2011-10-27: 30 mL via ORAL
  Filled 2011-10-27: qty 30

## 2011-10-27 MED FILL — Magnesium Sulfate Inj 50%: INTRAMUSCULAR | Qty: 10 | Status: AC

## 2011-10-27 MED FILL — Potassium Chloride Inj 2 mEq/ML: INTRAVENOUS | Qty: 40 | Status: AC

## 2011-10-27 NOTE — Discharge Summary (Signed)
I agree with the above discharge summary and plan for follow-up.  Start statin as well.  Willie [name] H

## 2011-10-27 NOTE — Progress Notes (Addendum)
[address]            [place] [address]          [phone]     3 Days Post-Op  Procedure(s) (LRB): CORONARY ARTERY BYPASS GRAFTING (CABG) (N/A) RADIAL ARTERY HARVEST (Left) Subjective: looks and feels well   Objective  Telemetry SR, BBB but does flip in and out between BBB and normal QRS complex  Temp:  [98.3 F (36.8 C)-99.9 F (37.7 C)] 98.5 F (36.9 C) (10/21 0441) Pulse Rate:  [98-112] 103  (10/21 0441) Resp:  [18-31] 20  (10/21 0441) BP: (102-143)/(52-75) 111/58 mmHg (10/21 0441) SpO2:  [91 %-96 %] 96 % (10/21 0441) Weight:  [203 lb 3.2 oz (92.171 kg)] 203 lb 3.2 oz (92.171 kg) (10/21 0441)   Intake/Output Summary (Last 24 hours) at 10/27/11 0849 Last data filed at 10/27/11 0300  Gross per 24 hour  Intake      0 ml  Output   1750 ml  Net  -1750 ml       General appearance: alert, cooperative and no distress Heart: regular rate and rhythm and S1, S2 normal Lungs: clear to auscultation bilaterally Abdomen: benign exam Extremities: no edema Wound: incisions healing well, L arm N/V intact  Lab Results:  Basename 10/26/11 0427 10/25/11 1624 10/25/11 1620 10/25/11 0400  NA 137 140 -- --  K 4.7 4.8 -- --  CL 102 103 -- --  CO2 27 -- -- 22  GLUCOSE 155* 134* -- --  BUN 14 15 -- --  CREATININE 1.19 1.40* -- --  CALCIUM 8.1* -- -- 7.8*  MG -- -- 2.8* 2.9*  PHOS -- -- -- --   No results found for this basename: AST:2,ALT:2,ALKPHOS:2,BILITOT:2,PROT:2,ALBUMIN:2 in the last 72 hours No results found for this basename: LIPASE:2,AMYLASE:2 in the last 72 hours  Basename 10/26/11 0427 10/25/11 1624 10/25/11 1620  WBC 19.4* -- 19.5*  NEUTROABS -- -- --  HGB 11.6* 13.3 --  HCT 33.8* 39.0 --  MCV 89.4 -- 90.1  PLT 139* -- 156   No results found for this basename: CKTOTAL:4,CKMB:4,TROPONINI:4 in the last 72 hours No components found with this basename: POCBNP:3 No results found for this basename: DDIMER in the last 72  hours No results found for this basename: HGBA1C in the last 72 hours No results found for this basename: CHOL,HDL,LDLCALC,TRIG,CHOLHDL in the last 72 hours No results found for this basename: TSH,T4TOTAL,FREET3,T3FREE,THYROIDAB in the last 72 hours No results found for this basename: VITAMINB12,FOLATE,FERRITIN,TIBC,IRON,RETICCTPCT in the last 72 hours  Medications: Scheduled    . acetaminophen  1,000 mg Oral Q6H   Or  . acetaminophen (TYLENOL) oral liquid 160 mg/5 mL  975 mg Per Tube Q6H  . aspirin EC  325 mg Oral Daily   Or  . aspirin  324 mg Per Tube Daily  . bisacodyl  10 mg Oral Daily   Or  . bisacodyl  10 mg Rectal Daily  . docusate sodium  200 mg Oral Daily  . hydrochlorothiazide  12.5 mg Oral Daily  . insulin aspart  0-15 Units Subcutaneous TID WC  . isosorbide mononitrate  30 mg Oral Daily  . lisinopril  10 mg Oral Daily  . metoCLOPramide  10 mg Oral TID AC & HS  . metoprolol tartrate  25 mg Oral BID   Or  . metoprolol tartrate  25 mg Per Tube BID  .  pantoprazole  40 mg Oral Daily  . polyethylene glycol  17 g Oral Once  . DISCONTD: insulin aspart  0-24 Units Subcutaneous Q4H  . DISCONTD: insulin regular  0-10 Units Intravenous TID WC  . DISCONTD: lisinopril-hydrochlorothiazide  0.5 tablet Oral Daily  . DISCONTD: metoprolol tartrate  12.5 mg Per Tube BID  . DISCONTD: metoprolol tartrate  12.5 mg Oral BID  . DISCONTD: sodium chloride  3 mL Intravenous Q12H     Radiology/Studies:  Dg Chest Portable 1 View In Am  10/26/2011  *RADIOLOGY REPORT*  Clinical Data: Status post cardiac surgery  PORTABLE CHEST - 1 VIEW  Comparison: 10/25/2011  Findings: The patient is status post median sternotomy CABG procedure.  There has been interval removal of the mediastinal drain and left chest tube. The Swan-Ganz catheter has been removed. There is a right IJ catheter sheath with tip in the SVC.  No pneumothorax noted.  Stable cardiac enlargement.  Left pleural effusion is identified and  there is atelectasis in the left midlung.  The lung volumes appear low.  IMPRESSION:  1. No pneumothorax after removal of chest tubes. 2.  Left effusion and left midlung atelectasis.   Original Report Authenticated By: Rosealee Albee, M.D.     INR: Will add last result for INR, ABG once components are confirmed Will add last 4 CBG results once components are confirmed  Assessment/Plan: S/P Procedure(s) (LRB): CORONARY ARTERY BYPASS GRAFTING (CABG) (N/A) RADIAL ARTERY HARVEST (Left)  1. Doing well overall 2  cont rehab/pulm toilet 3  no new labs 4  imdur for ra graft 5  poss d/c 1-2 days 6 currently not on statin    LOS: 5 days    [name],[name] 10/21/20138:49 AM    I have seen and examined the patient and agree with the assessment and plan as outlined.  However, will d/c Imdur after today's dose. Start statin. Tentatively for d/c home in am.  Purcell Nails 10/27/2011 9:38 AM

## 2011-10-27 NOTE — Progress Notes (Signed)
Pt c/o constipation; pt given 30ml PO MOM at this time; will cont. To monitor.

## 2011-10-27 NOTE — Progress Notes (Signed)
Pt up ambulating in hallway with wife without difficulty; will cont. To monitor.

## 2011-10-27 NOTE — Progress Notes (Signed)
EPW d/c at this time; VSS; pt education given; bedrest until 1550; pt tolerated well; no oozing; will cont. To monitor.

## 2011-10-27 NOTE — Progress Notes (Signed)
Pt up ambulating in hallway with wife at this time; pt still no BM today; pt prefers to wait and d/c EPW once pt has BM; will cont. To monitor; pt given Colace, Dulcolax, and MOM; pt ate breakfast, but has not eaten lunch at this time.

## 2011-10-27 NOTE — Progress Notes (Signed)
Pt up ambulating with wife in hallway; will cont. To monitor.

## 2011-10-27 NOTE — Discharge Summary (Signed)
[address]            [place] [address]          [phone]      Willie [name] 01/29/1965 46 y.o. [id number]  10/22/2011   Purcell Nails, MD  CAD CAD  History of Presenting Illness:  This is a 46 year old Caucasian male who had complaints of chest pressure and shortness of breath with exertion. 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 He was being seen by a primary care physician in [place] earlier this summer. He then decided to switch physicians.His new primary care doctor ordered an EKG, after learning of his chest pressure.He was then referred to a cardiologist in the [facility]. A Myoview stress test and an echocardiogram gram were done. The echocardiogram apparently showed moderate left ventricular dysfunction (LVEF of 30%), mild left atrial enlargement, and moderate mitral and tricuspid insufficiency. The Myoview stress test showed no evidence of myocardial ischemia.Because of the patient's symptoms of chest pressure with exertion, it was recommended that he undergo a cardiac catheterization. He was admitted to [facility] on 10/21/2011 in order to undergo this procedure.He was found to have multivessel coronary artery disease. He was accepted in transfer to [facility] for consideration of coronary artery bypsss grafting surgery.  Past Medical History:  1.Hodgkin's lymphoma (had both chemotherapy and radiation 94')  2. Hypertension  3. Hyperlipidemia  Past Surgical History:  Lymph node biopsy on his right neck  Family History:  Mother is alive. She has carotid disease.  Father is deceased at age 3. He had a stroke, MI, kidney transplant  Brother had an MI at age 80.  Social History:  He denies alcohol use. He did smoke cigarettes but quit 15 years ago. When he did smoke, it was 1/4  of a pack for about 5 years. He now uses "dip" (smokeless tobacco).  Allergies: No known drug allergies  Medications Prior to Admission:  1. Metoprolol tartrate 50 mg by mouth 2 times daily  2. Lisinopril/hydrochlorothiazide 10/12.5 mg one half tablet by mouth daily  3. Enteric coated aspirin 81 mg by mouth daily  4.Senokot 4 daily  5.Testosterone injection every 2 weeks   Hospital Course: The patient was admitted in transfer and Dr. Cornelius Moras evaluated the patient and his studies and agreed with recommendations to proceed with surgical revascularization. He remained medically stable to proceed and on 10/24/2011 he was taken the operating room where he underwent the following procedure:  Date of Procedure: 10/24/2011  Preoperative Diagnosis:  Severe Left Main Coronary Artery Disease Postoperative Diagnosis:  Same Procedure:  Coronary Artery Bypass Grafting x 3  Left Internal Mammary Artery to Distal Left Anterior Descending Coronary Artery  Right Internal Mammary Artery to Distal Right Coronary Artery  Left Radial Artery to the Second Obtuse Marginal Branch of Left Circumflex Coronary Artery Surgeon: Salvatore Decent. Cornelius Moras, MD  Assistant: Doree Fudge, PA-C and Gershon Crane, [place]  Anesthesia: Arta Bruce, MD  Operative Findings:  Moderate left ventricular systolic dysfunction (EF 30%)  Good quality left internal mammary artery conduit  Good quality right internal mammary artery conduit  Good quality left radial artery conduit  Intramyocardial left anterior descending coronary artery  Good quality target vessels for grafting  The patient  tolerated the procedure well and is transported to the surgical intensive care in stable condition. There are no intraoperative complications. All sponge instrument and needle counts are verified correct at completion of the operation.    Postoperative hospital course:  The patient has done quite well. He is maintained stable hemodynamics. He was weaned  from the ventilator without significant difficulty. All routine lines, monitors and drainage devices have been discontinued in the standard fashion. His left arm remains neurovascularly intact. He has been started on Imdur were for the radial artery graft. Incisions are all healing well without evidence of infection. He is tolerating gradually increasing activities using standard protocols. Oxygen has been weaned and he maintains adequate saturations on room air. He does have a mild acute blood loss anemia which has stabilized. He did develop a mild renal insufficiency but has returned to normal creatinine. He had a mild postoperative ileus which has resolved. His overall status is felt to be tentatively stable for discharge in the next 1-2 days pending ongoing reevaluation of his recovery.       Basename 10/26/11 0427 10/25/11 1624 10/25/11 0400  NA 137 140 --  K 4.7 4.8 --  CL 102 103 --  CO2 27 -- 22  GLUCOSE 155* 134* --  BUN 14 15 --  CALCIUM 8.1* -- 7.8*    Basename 10/26/11 0427 10/25/11 1624 10/25/11 1620  WBC 19.4* -- 19.5*  HGB 11.6* 13.3 --  HCT 33.8* 39.0 --  PLT 139* -- 156    Basename 10/24/11 1357  INR 2.01*     Discharge Instructions:  The patient is discharged to home with extensive instructions on wound care and progressive ambulation.  They are instructed not to drive or perform any heavy lifting until returning to see the physician in his office.  Discharge Diagnosis:  CAD CAD  Secondary Diagnosis: Patient Active Problem List  Diagnosis  . Left main coronary artery disease  . S/P radiation therapy  . Unstable angina  . Hyperlipidemia  . Family history of coronary artery disease  . S/P CABG x 3   Past Medical History  Diagnosis Date  . Coronary artery disease   . Hypertension   . Hodgkin's lymphoma 1994    neck/midchest area with radiation  . Left main coronary artery disease 10/22/2011  . S/P radiation therapy 10/21/1992    Radiation therapy to  chest and neck for Hodgkin's lymphoma  . Unstable angina 10/22/2011  . Hyperlipidemia 10/23/2011  . Family history of coronary artery disease 10/23/2011  . 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           testosterone cypionate (DEPOTESTOTERONE CYPIONATE) 200 MG/ML injection Inject into the muscle every 14 (fourteen) days. Due Saturday 10/25/2011           aspirin EC 325 MG EC tablet Take 1 tablet (325 mg total) by mouth daily.           metoprolol tartrate (LOPRESSOR) 25 MG tablet Take 1 tablet (25 mg total) by mouth 2 (two) times daily.           oxyCODONE (OXY IR/ROXICODONE) 5  MG immediate release tablet Take 1-2 tablets (5-10 mg total) by mouth every 4 (four) hours as needed for pain.             Follow-up Information    Follow up with Purcell Nails, MD. (11/24/2011 at 2:30 PM. Please obtain a chest x-ray at [facility] imaging at 1:30 PM . [facility] imaging is located in the same office complex)    Contact information:   [address] [place] [address] [phone]       Follow up with Lamar Blinks, MD. (2 weeks-contact office to arrange this appointment)    Contact information:   [name] [place] [address] [phone]         Patient is currently on ACE inhibitor as well as beta blocker. We will determine prior to discharge statin therapy     Disposition: For discharge home  Patient's condition is Good  Gershon Crane, PA-C 10/27/2011  9:20 AM

## 2011-10-28 ENCOUNTER — Encounter (HOSPITAL_COMMUNITY): Payer: Self-pay | Admitting: Thoracic Surgery (Cardiothoracic Vascular Surgery)

## 2011-10-28 LAB — GLUCOSE, CAPILLARY: Glucose-Capillary: 90 mg/dL (ref 70–99)

## 2011-10-28 MED ORDER — LISINOPRIL-HYDROCHLOROTHIAZIDE 10-12.5 MG PO TABS: 1.0000 | ORAL_TABLET | Freq: Every day | ORAL | Status: DC

## 2011-10-28 MED ORDER — ATORVASTATIN CALCIUM 80 MG PO TABS: 80.0000 mg | ORAL_TABLET | Freq: Every day | ORAL | Status: DC

## 2011-10-28 MED FILL — Mannitol IV Soln 20%: INTRAVENOUS | Qty: 500 | Status: AC

## 2011-10-28 MED FILL — Heparin Sodium (Porcine) Inj 1000 Unit/ML: INTRAMUSCULAR | Qty: 30 | Status: AC

## 2011-10-28 MED FILL — Lidocaine HCl IV Inj 20 MG/ML: INTRAVENOUS | Qty: 5 | Status: AC

## 2011-10-28 MED FILL — Sodium Chloride IV Soln 0.9%: INTRAVENOUS | Qty: 1000 | Status: AC

## 2011-10-28 MED FILL — Heparin Sodium (Porcine) Inj 1000 Unit/ML: INTRAMUSCULAR | Qty: 10 | Status: AC

## 2011-10-28 MED FILL — Electrolyte-R (PH 7.4) Solution: INTRAVENOUS | Qty: 4000 | Status: AC

## 2011-10-28 MED FILL — Sodium Bicarbonate IV Soln 8.4%: INTRAVENOUS | Qty: 50 | Status: AC

## 2011-10-28 MED FILL — Sodium Chloride Irrigation Soln 0.9%: Qty: 3000 | Status: AC

## 2011-10-28 NOTE — Progress Notes (Addendum)
                   [address]            [place] [address]          [phone]      4 Days Post-Op Procedure(s) (LRB): CORONARY ARTERY BYPASS GRAFTING (CABG) (N/A) RADIAL ARTERY HARVEST (Left)  Subjective: Patient with a bowel movement. Looking forward to going home.  Objective: Vital signs in last 24 hours: Patient Vitals for the past 24 hrs:  BP Temp Temp src Pulse Resp SpO2 Weight  10/28/11 0408 104/50 mmHg 98.6 F (37 C) Oral 105  18  95 % 201 lb 1.6 oz (91.218 kg)  10/27/11 2058 104/61 mmHg 98.7 F (37.1 C) Oral 104  18  93 % -  10/27/11 1545 115/66 mmHg - - 100  - - -  10/27/11 1530 113/59 mmHg - - 96  - - -  10/27/11 1515 122/62 mmHg - - 99  - - -  10/27/11 1500 114/51 mmHg - - 101  - - -  10/27/11 1454 122/69 mmHg 98.7 F (37.1 C) Oral 104  18  94 % -  10/27/11 1451 131/65 mmHg - - 111  - - -  10/27/11 0840 94/54 mmHg 98.9 F (37.2 C) Oral 111  20  96 % -   Pre op weight  89  Current Weight  10/28/11 201 lb 1.6 oz (91.218 kg)      Intake/Output from previous day: 10/21 0701 - 10/22 0700 In: 960 [P.O.:960] Out: 1650 [Urine:1650]   Physical Exam:  Cardiovascular: Slihlty tachycardic, no murmur Pulmonary: Clear to auscultation bilaterally; no rales, wheezes, or rhonchi. Abdomen: Soft, non tender, bowel sounds present. Extremities: Trace bilateral lower extremity edema;nmotor and sensory intact on left Wounds: Clean and dry.  No erythema or signs of infection.  Lab Results: CBC: Basename 10/26/11 0427 10/25/11 1624 10/25/11 1620  WBC 19.4* -- 19.5*  HGB 11.6* 13.3 --  HCT 33.8* 39.0 --  PLT 139* -- 156   BMET:  Basename 10/26/11 0427 10/25/11 1624  NA 137 140  K 4.7 4.8  CL 102 103  CO2 27 --  GLUCOSE 155* 134*  BUN 14 15  CREATININE 1.19 1.40*  CALCIUM 8.1* --    PT/INR:  Lab Results  Component Value Date   INR 2.01* 10/24/2011   INR 0.90 10/23/2011   INR 0.96 10/22/2011   ABG:  INR: Will add last result for  INR, ABG once components are confirmed Will add last 4 CBG results once components are confirmed  Assessment/Plan:  1. CV - ST.On Lopressor 25 bid, Lisinopril 10 daily, and HCTZ 12.5 daily. 2.  Pulmonary - Encourage incentive spirometer 3.  Acute blood loss anemia - Last H and H 11.6 and 33.8 4.Remove chest tube sutures 5.Discharge home  ZIMMERMAN,DONIELLE MPA-C 10/28/2011,6:52 AM   I agree with the above discharge summary and plan for follow-up.  OWEN,CLARENCE H

## 2011-10-28 NOTE — Progress Notes (Signed)
Pt up ambulating in hallway with wife, no complaints, will continue to monitor.

## 2011-10-28 NOTE — Progress Notes (Signed)
1000-1050 Cardiac Rehab Completed discharge education with pt and wife. Pt is interested in  Outpt. CRP, will send letter to them. Discussed tobacco cessation with pt. Gave him tips for quitting and coaching contact numbers. Pt seems motivated to quit. Pt and wife voices understanding of information provided.

## 2011-10-28 NOTE — Progress Notes (Signed)
CT sutures removed per MD order; steri strips applied to sites; pt education given; no oozing noted; will cont. To monitor.

## 2011-10-29 ENCOUNTER — Encounter (HOSPITAL_COMMUNITY): Payer: Self-pay

## 2011-11-19 ENCOUNTER — Encounter: Payer: Self-pay | Admitting: Internal Medicine

## 2011-11-21 ENCOUNTER — Other Ambulatory Visit: Payer: Self-pay | Admitting: Thoracic Surgery (Cardiothoracic Vascular Surgery)

## 2011-11-21 DIAGNOSIS — Z951 Presence of aortocoronary bypass graft: Secondary | ICD-10-CM

## 2011-11-21 DIAGNOSIS — I2510 Atherosclerotic heart disease of native coronary artery without angina pectoris: Secondary | ICD-10-CM

## 2011-11-24 ENCOUNTER — Ambulatory Visit (INDEPENDENT_AMBULATORY_CARE_PROVIDER_SITE_OTHER): Payer: Self-pay | Admitting: Thoracic Surgery (Cardiothoracic Vascular Surgery)

## 2011-11-24 ENCOUNTER — Ambulatory Visit
Admission: RE | Admit: 2011-11-24 | Discharge: 2011-11-24 | Disposition: A | Discharge status: Home / Self Care | Payer: 59 | Source: Ambulatory Visit | Attending: Thoracic Surgery (Cardiothoracic Vascular Surgery) | Admitting: Thoracic Surgery (Cardiothoracic Vascular Surgery)

## 2011-11-24 ENCOUNTER — Encounter: Payer: Self-pay | Admitting: Thoracic Surgery (Cardiothoracic Vascular Surgery)

## 2011-11-24 VITALS — BP 100/73 | HR 96 | Resp 18 | Ht 68.0 in | Wt 190.0 lb

## 2011-11-24 DIAGNOSIS — I2510 Atherosclerotic heart disease of native coronary artery without angina pectoris: Secondary | ICD-10-CM

## 2011-11-24 DIAGNOSIS — Z951 Presence of aortocoronary bypass graft: Secondary | ICD-10-CM

## 2011-11-24 NOTE — Patient Instructions (Signed)
The patient should continue to avoid any heavy lifting or strenuous use of arms or shoulders for at least a total of three months from the time of surgery. The patient may return to driving an automobile as long as they are no longer requiring oral narcotic pain relievers during the daytime.  It would be wise to start driving only short distances during the daylight and gradually increase from there as they feel comfortable. The patient may start the cardiac rehab program. The patient should continue every effort to refrain from smoking permanently.

## 2011-11-24 NOTE — Progress Notes (Signed)
[address]            [place] [address]          [phone]     CARDIOTHORACIC SURGERY OFFICE NOTE  Referring Provider is Arnoldo Hooker, MD PCP is Johney Maine, MD   HPI:   Patient returns for routine followup status post coronary artery bypass grafting x3 on 10/24/2011. His postoperative recovery has been entirely uncomplicated. Since hospital discharge the patient has continued to do very well. He is artery been seen in followup by his cardiologist. Lipitor was discontinued as he did not tolerate it. He has not yet been restarted on an alternative statin, but he plans to followup in December. The patient reports that he no longer has any significant pain or soreness in his chest. He reports that the skin along his anterior chest wall feel sensitive, but otherwise feels fine. He does denies any sensation of clicking or motion of the sternum. He has no shortness of breath and his activity level is quite good. He is already starting the cardiac rehabilitation program. He is walking every day. He has no cough and he has continued to abstain from any tobacco use. His appetite is good and he is sleeping well at night. Overall he feels well and has no complaints.   Current Outpatient Prescriptions  Medication Sig Dispense Refill  . aspirin EC 325 MG EC tablet Take 1 tablet (325 mg total) by mouth daily.  30 tablet    . lisinopril-hydrochlorothiazide (PRINZIDE,ZESTORETIC) 10-12.5 MG per tablet Take 1 tablet by mouth daily.  30 tablet  1  . metoprolol tartrate (LOPRESSOR) 25 MG tablet Take 1 tablet (25 mg total) by mouth 2 (two) times daily.  60 tablet  1      Physical Exam:   BP 100/73  Pulse 96  Resp 18  Ht 5\' 8"  (1.727 m)  Wt 190 lb (86.183 kg)  BMI 28.89 kg/m2  SpO2 98%  General:  Well-appearing  Chest:   Auscultation with symmetrical breath sounds  CV:   Regular rate and rhythm without murmur  Incisions:  Clean and dry and healing nicely,  sternum is stable on palpation  Abdomen:  Soft and nontender  Extremities:  Warm and well-perfused  Diagnostic Tests:  *RADIOLOGY REPORT*  Clinical Data: CABG on 10/19/2011, follow-up  CHEST - 2 VIEW  Comparison: Portable chest x-ray of 10/26/2011  Findings: Aeration of the lungs has improved significantly. The  basilar atelectasis has resolved. No effusion is noted.  Cardiomegaly is stable. Median sternotomy sutures are noted.  Calcified right paratracheal lymph nodes and right hilar nodes are  present from prior granulomatous disease.  IMPRESSION:  No active lung disease. Stable cardiomegaly.  Original Report Authenticated By: Dwyane Dee, M.D.    Impression:  The patient is doing exceptionally well just 4 weeks status post coronary artery bypass grafting using bilateral internal mammary arteries and left radial artery for grafts. His exercise tolerance is already good and he is abstaining from all tobacco use.  Plan:  I've reminded the patient he should continue to refrain from any heavy lifting or strenuous use of his arms or shoulders for at least another 2 months. I think he can resume driving an automobile. I think he is ready to actively participate in the cardiac rehabilitation program. He has been reminded how important it will remain for him to continue to abstain  from tobacco use for the rest of his life. All of his questions been addressed. In the future he will call and return to see [place] as needed.   Salvatore Decent. Cornelius Moras, MD 11/24/2011 2:35 PM

## 2011-12-07 ENCOUNTER — Encounter: Payer: Self-pay | Admitting: Internal Medicine

## 2011-12-13 ENCOUNTER — Encounter: Payer: Self-pay | Admitting: Internal Medicine

## 2013-05-08 ENCOUNTER — Emergency Department: Payer: Self-pay | Admitting: Internal Medicine

## 2013-05-08 LAB — URIC ACID: URIC ACID: 8.6 mg/dL — AB (ref 3.5–7.2)

## 2013-06-13 ENCOUNTER — Emergency Department: Payer: Self-pay | Admitting: Emergency Medicine

## 2013-06-13 LAB — COMPREHENSIVE METABOLIC PANEL
ALT: 33 U/L (ref 12–78)
AST: 28 U/L (ref 15–37)
Albumin: 4.1 g/dL (ref 3.4–5.0)
Alkaline Phosphatase: 96 U/L
Anion Gap: 7 (ref 7–16)
BUN: 22 mg/dL — AB (ref 7–18)
Bilirubin,Total: 0.5 mg/dL (ref 0.2–1.0)
Calcium, Total: 9.4 mg/dL (ref 8.5–10.1)
Chloride: 101 mmol/L (ref 98–107)
Co2: 28 mmol/L (ref 21–32)
Creatinine: 1.91 mg/dL — ABNORMAL HIGH (ref 0.60–1.30)
EGFR (Non-African Amer.): 40 — ABNORMAL LOW
GFR CALC AF AMER: 47 — AB
Glucose: 101 mg/dL — ABNORMAL HIGH (ref 65–99)
OSMOLALITY: 275 (ref 275–301)
POTASSIUM: 4 mmol/L (ref 3.5–5.1)
Sodium: 136 mmol/L (ref 136–145)
Total Protein: 8.6 g/dL — ABNORMAL HIGH (ref 6.4–8.2)

## 2013-06-13 LAB — URINALYSIS, COMPLETE
Bilirubin,UR: NEGATIVE
Glucose,UR: NEGATIVE mg/dL (ref 0–75)
KETONE: NEGATIVE
LEUKOCYTE ESTERASE: NEGATIVE
NITRITE: NEGATIVE
Ph: 5 (ref 4.5–8.0)
Protein: NEGATIVE
RBC,UR: 300 /HPF (ref 0–5)
SQUAMOUS EPITHELIAL: NONE SEEN
Specific Gravity: 1.014 (ref 1.003–1.030)
WBC UR: 2 /HPF (ref 0–5)

## 2013-06-13 LAB — CBC
HCT: 46.8 % (ref 40.0–52.0)
HGB: 15.4 g/dL (ref 13.0–18.0)
MCH: 29.9 pg (ref 26.0–34.0)
MCHC: 32.9 g/dL (ref 32.0–36.0)
MCV: 91 fL (ref 80–100)
Platelet: 211 10*3/uL (ref 150–440)
RBC: 5.16 10*6/uL (ref 4.40–5.90)
RDW: 13.5 % (ref 11.5–14.5)
WBC: 9.8 10*3/uL (ref 3.8–10.6)

## 2013-09-18 ENCOUNTER — Emergency Department: Payer: Self-pay | Admitting: Emergency Medicine

## 2014-04-25 NOTE — Discharge Summary (Signed)
PATIENT NAME:  Willie [name], Willie [name] MR#:  [id number] DATE OF BIRTH:  1965/12/21  DATE OF ADMISSION:  10/21/2011 DATE OF DISCHARGE:  10/22/2011  DISCHARGE DIAGNOSES:  1. Unstable angina.  2. Coronary artery disease.  3. Hypertension.   HISTORY: This is a 49 year old male with previous history of radiation therapy to his chest due to Hodgkin's lymphoma who has had progressive episodes of chest pain, shortness of breath consistent with angina. He underwent cardiac catheterization showing critical left main coronary artery disease, atherosclerosis, and other moderate atherosclerosis concerning for his current symptoms. The patient had symptoms while on the table which were considered unstable and had full relief of those with appropriate medications including nitroglycerin and oxygen. He had no evidence of myocardial infarction by EKG. He was doing quite well and needed further transfer for coronary artery bypass graft. Therefore, he was discharged and transferred to [facility] without difficulty with further follow-up thereafter.   ____________________________ Corey Skains, MD bjk:drc D: 10/22/2011 12:35:21 ET T: 10/22/2011 14:01:06 ET JOB#: [id number]  cc: Corey Skains, MD, <Dictator> Corey Skains MD ELECTRONICALLY SIGNED 10/27/2011 13:39

## 2014-09-04 DIAGNOSIS — I340 Nonrheumatic mitral (valve) insufficiency: Secondary | ICD-10-CM | POA: Insufficient documentation

## 2015-04-11 DIAGNOSIS — I5022 Chronic systolic (congestive) heart failure: Secondary | ICD-10-CM | POA: Insufficient documentation

## 2015-05-16 ENCOUNTER — Ambulatory Visit (INDEPENDENT_AMBULATORY_CARE_PROVIDER_SITE_OTHER): Payer: Worker's Compensation

## 2015-05-16 ENCOUNTER — Ambulatory Visit
Admission: EM | Admit: 2015-05-16 | Discharge: 2015-05-16 | Disposition: A | Discharge status: Home / Self Care | Payer: Worker's Compensation | Attending: Family Medicine | Admitting: Family Medicine

## 2015-05-16 DIAGNOSIS — T148 Other injury of unspecified body region: Secondary | ICD-10-CM | POA: Diagnosis not present

## 2015-05-16 DIAGNOSIS — S6991XA Unspecified injury of right wrist, hand and finger(s), initial encounter: Secondary | ICD-10-CM

## 2015-05-16 DIAGNOSIS — T148XXA Other injury of unspecified body region, initial encounter: Secondary | ICD-10-CM

## 2015-05-16 NOTE — ED Provider Notes (Signed)
CSN: [id number]     Arrival date & time 05/16/15  1114 History   First MD Initiated Contact with Patient 05/16/15 1132     Chief Complaint  Patient presents with  . Hand Injury    right hand crushing injury under 150lb of metal. Swelling and pain to hand. Pulses and skin  intact. Pain 8/10   (Consider location/radiation/quality/duration/timing/severity/associated sxs/prior Treatment) HPI: Patient presents today for injury to his right hand. Patient states that he was putting an [facility] unit in when it slipped and he got his hand caught against the unit and the wall. He was able to move his hand quickly. He admits to some swelling of his right hand and some discomfort with movement of his wrist and digits. He denies any out of proportion pain, tingling or numbness, discoloration, injury anywhere else. He states his pain is a 6 or 7 out of 10 now that he has put ice on it. He denies taking any medication for his injury.  Past Medical History  Diagnosis Date  . Coronary artery disease   . Hypertension   . Hodgkin's lymphoma ([place]) 1994    neck/midchest area with radiation  . Left main coronary artery disease 10/22/2011  . S/P radiation therapy 10/21/1992    Radiation therapy to chest and neck for Hodgkin's lymphoma  . Unstable angina ([place]) 10/22/2011  . Hyperlipidemia 10/23/2011  . Family history of coronary artery disease 10/23/2011  . 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 Cardiac catheterization    . Carpal tunnel release    . Lymph node biopsy  1994    left neck  . Coronary artery bypass graft  10/24/2011    Procedure: CORONARY ARTERY BYPASS GRAFTING (CABG);  Surgeon: Rexene Alberts, MD;  Location: [place];  Service: Open Heart Surgery;  Laterality: N/A;  Coronary Artery Bypass Grafting X 3 Using Left Internal  Mammary Artery, Right Internal Mammary Artery and Right Radial Artery  . Radial artery harvest   10/24/2011    Procedure: RADIAL ARTERY HARVEST;  Surgeon: Rexene Alberts, MD;  Location: [facility] OR;  Service: Vascular;  Laterality: Left;   Family History  Problem Relation Age of Onset  . Coronary artery disease     Social History  Substance Use Topics  . Smoking status: Former Smoker -- 0.50 packs/day    Quit date: 10/21/1996  . Smokeless tobacco: Former Systems developer    Types: Snuff  . Alcohol Use: No    Review of Systems  Negative except mentioned above.   Allergies  Review of patient's allergies indicates no known allergies.  Home Medications   Prior to Admission medications   Medication Sig Start Date End Date Taking? Authorizing Provider  aspirin EC 325 MG EC tablet Take 1 tablet (325 mg total) by mouth daily. 10/27/11   Wayne E Gold, PA-C  lisinopril-hydrochlorothiazide (PRINZIDE,ZESTORETIC) 10-12.5 MG per tablet Take 1 tablet by mouth daily. 10/28/11   Donielle Liston Alba, PA-C  metoprolol tartrate (LOPRESSOR) 25 MG tablet Take 1 tablet (25 mg total) by mouth 2 (two) times daily. 10/27/11   John Giovanni, PA-C   Meds Ordered and Administered this Visit  Medications - No data to display  BP 124/69 mmHg  Pulse 84  Temp(Src) 98 F (36.7 C) (Oral)  Resp 18  Ht 5\' 8"  (1.727 m)  Wt 210 lb (95.255 kg)  BMI  31.94 kg/m2  SpO2 98% No data found.   Physical Exam   GENERAL: NAD RESP: CTA B CARD: RRR EXTREM: R Hand- no deformity or discoloration, mild swelling along the dorsal aspect of the middle portion of the hand, mild discomfort of distal wrist, no snuffbox tenderness, mild limitation of range of motion with digits and wrist due to discomfort, nv intact NEURO: CN II-XII grossly intact   ED Course  Procedures (including critical care time)  Labs Review Labs Reviewed - No data to display  Imaging Review Dg Hand Complete Right  05/16/2015  CLINICAL DATA:  Right posterior hand pain after dropping heavy object onto it earlier today. EXAM: RIGHT HAND - COMPLETE 3+ VIEW  COMPARISON:  None. FINDINGS: There is no evidence of fracture or dislocation. There is no evidence of arthropathy or other focal bone abnormality. Soft tissues are unremarkable. IMPRESSION: Negative. Electronically Signed   By: Rolm Baptise M.D.   On: 05/16/2015 12:26     MDM   1. Hand injury, right, initial encounter   2. Contusion   Discussed with patient the results of the x-ray, there does not appear to be any fracture. I have advised him to use ice on the area and elevate the area. An Ace wrap was placed on the area. I have recommended that he go to the ER immediately if his symptoms do get worse. Explained to him the symptoms of compartment syndrome. Patient addresses understanding, declines any narcotic pain medicine. Will take over-the-counter pain medication. Would like to have patient reevaluated on Friday before he returns back to work. He is right-hand dominant and his work requires him to lift.    Paulina Fusi, MD 05/16/15 [phone]

## 2015-05-18 ENCOUNTER — Ambulatory Visit (INDEPENDENT_AMBULATORY_CARE_PROVIDER_SITE_OTHER): Payer: Worker's Compensation

## 2015-05-18 ENCOUNTER — Ambulatory Visit
Admission: EM | Admit: 2015-05-18 | Discharge: 2015-05-18 | Disposition: A | Discharge status: Home / Self Care | Payer: Worker's Compensation | Attending: Internal Medicine | Admitting: Internal Medicine

## 2015-05-18 ENCOUNTER — Encounter: Payer: Self-pay | Admitting: Emergency Medicine

## 2015-05-18 DIAGNOSIS — S6991XD Unspecified injury of right wrist, hand and finger(s), subsequent encounter: Secondary | ICD-10-CM

## 2015-05-18 DIAGNOSIS — M25531 Pain in right wrist: Secondary | ICD-10-CM | POA: Diagnosis not present

## 2015-05-18 DIAGNOSIS — S6991XS Unspecified injury of right wrist, hand and finger(s), sequela: Secondary | ICD-10-CM

## 2015-05-18 MED ORDER — HYDROCODONE-ACETAMINOPHEN 5-325 MG PO TABS: 1.0000 | ORAL_TABLET | ORAL | Status: DC | PRN

## 2015-05-18 NOTE — Discharge Instructions (Signed)
Wrist Pain There are many things that can cause wrist pain. Some common causes include:  An injury to the wrist area.  Overuse of the joint.  A condition that causes too much pressure to be put on a nerve in the wrist (carpal tunnel syndrome).  Wear and tear of the joints that happens as a person gets older (osteoarthritis).  Other types of arthritis. Sometimes, the cause is not known. The pain often goes away when you follow instructions from your doctor about relieving pain at home. If your wrist pain does not go away, tests may need to be done to find the cause. HOME CARE Pay attention to any changes in your symptoms. Take these actions to help with your pain:  Rest your wrist for at least 48 hours or as told by your doctor.  If your doctor tells you to, put ice on the injured area:  Put ice in a plastic bag.  Place a towel between your skin and the bag.  Leave the ice on for 20 minutes, 2-3 times per day.  Keep your arm raised (elevated) above the level of your heart while you are sitting or lying down.  If a splint or elastic bandage has been put on the injured area:  Wear it as told by your doctor.  Take the splint or bandage off only as told by your doctor.  Loosen the splint or bandage if your fingers lose feeling (are numb) or have a tingling feeling, or if they turn cold or blue.  Take over-the-counter and prescription medicines only as told by your doctor.  Keep all follow-up visits as told by your doctor. This is important. GET HELP IF:  Your pain is not helped by treatment.  Your pain gets worse. GET HELP RIGHT AWAY IF:   Your fingers swell.  Your fingers turn white, very red, or cold and blue.  Your fingers lose feeling or have a tingling feeling.  You have trouble moving your fingers.   This information is not intended to replace advice given to you by your health care provider. Make sure you discuss any questions you have with your health care  provider.   Document Released: 06/11/2007 Document Revised: 09/13/2014 Document Reviewed: 05/10/2014 Elsevier Interactive Patient Education [employer].

## 2015-05-18 NOTE — ED Provider Notes (Signed)
CSN: [id number]     Arrival date & time 05/18/15  0847 History   First MD Initiated Contact with Patient 05/18/15 [phone]     Chief Complaint  Patient presents with  . Worker's Comp Follow-up   . Hand Injury   (Consider location/radiation/quality/duration/timing/severity/associated sxs/prior Treatment) HPI History obtained from patient: Location: Right hand/wrist  Context/Duration: Wednesday pt dropped a 150# AC unit onto right hand. Was seen in [place] ( Dr. Posey Pronto)  no fracture, ace wrap applied, pt states pain is worse with more swelling and new pain in the right wrist.  Severity:7   Quality:throbbing/ache Timing:  constant          Home Treatment: elevation, cold packs, tylenol ibuprofen for pain with some relief.  Associated symptoms:  swelling Family History:no family history Social History: former smoker  Past Medical History  Diagnosis Date  . Coronary artery disease   . Hypertension   . Hodgkin's lymphoma ([place]) 1994    neck/midchest area with radiation  . Left main coronary artery disease 10/22/2011  . S/P radiation therapy 10/21/1992    Radiation therapy to chest and neck for Hodgkin's lymphoma  . Unstable angina ([place]) 10/22/2011  . Hyperlipidemia 10/23/2011  . Family history of coronary artery disease 10/23/2011  . 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 Cardiac catheterization    . Carpal tunnel release    . Lymph node biopsy  1994    left neck  . Coronary artery bypass graft  10/24/2011    Procedure: CORONARY ARTERY BYPASS GRAFTING (CABG);  Surgeon: Rexene Alberts, MD;  Location: [facility];  Service: Open Heart Surgery;  Laterality: N/A;  Coronary Artery Bypass Grafting X 3 Using Left Internal  Mammary Artery, Right Internal Mammary Artery and Right Radial Artery  . Radial artery harvest  10/24/2011    Procedure: RADIAL ARTERY HARVEST;  Surgeon: Rexene Alberts, MD;  Location: [facility] OR;  Service:  Vascular;  Laterality: Left;   Family History  Problem Relation Age of Onset  . Coronary artery disease     Social History  Substance Use Topics  . Smoking status: Former Smoker -- 0.50 packs/day    Quit date: 10/21/1996  . Smokeless tobacco: Former Systems developer    Types: Snuff  . Alcohol Use: No    Review of Systems ROS +'ve right wrist, hand pain and swelling.   Denies: HEADACHE, NAUSEA, ABDOMINAL PAIN, CHEST PAIN, CONGESTION, DYSURIA, SHORTNESS OF BREATH  Allergies  Review of patient's allergies indicates no known allergies.  Home Medications   Prior to Admission medications   Medication Sig Start Date End Date Taking? Authorizing Provider  aspirin EC 325 MG EC tablet Take 1 tablet (325 mg total) by mouth daily. 10/27/11   Wayne E Gold, PA-C  lisinopril-hydrochlorothiazide (PRINZIDE,ZESTORETIC) 10-12.5 MG per tablet Take 1 tablet by mouth daily. 10/28/11   Donielle Liston Alba, PA-C  metoprolol tartrate (LOPRESSOR) 25 MG tablet Take 1 tablet (25 mg total) by mouth 2 (two) times daily. 10/27/11   John Giovanni, PA-C   Meds Ordered and Administered this Visit  Medications - No data to display  BP 115/69 mmHg  Pulse 60  Temp(Src) 97.4 F (36.3 C) (Tympanic)  Resp 16  Ht 5\' 8"  (1.727 m)  Wt 210 lb (95.255 kg)  BMI 31.94 kg/m2  SpO2 100% No data found.   Physical Exam NURSES NOTES AND  VITAL SIGNS REVIEWED. CONSTITUTIONAL: Well developed, well nourished, no acute distress HEENT: normocephalic, atraumatic EYES: Conjunctiva normal NECK:normal ROM, supple, no adenopathy PULMONARY:No respiratory distress, normal effort ABDOMINAL: Soft, ND, NT BS+, No CVAT MUSCULOSKELETAL: Right Hand/wrist, swollen, tender to palpation, no visible or palpable deformity. Sensory and motor function intact in hand.  SKIN: warm and dry without rash PSYCHIATRIC: Mood and affect, behavior are normal  ED Course  Procedures (including critical care time)  Labs Review Labs Reviewed - No data to  display  Imaging Review Dg Wrist Complete Right  05/18/2015  CLINICAL DATA:  Crush injury to hand 3 days ago, subsequent encounter EXAM: RIGHT WRIST - COMPLETE 3+ VIEW COMPARISON:  05/16/2015 FINDINGS: There is no evidence of fracture or dislocation. There is no evidence of arthropathy or other focal bone abnormality. Soft tissues are unremarkable. IMPRESSION: No acute abnormality is noted. Electronically Signed   By: Inez Catalina M.D.   On: 05/18/2015 10:17   Dg Hand Complete Right  05/16/2015  CLINICAL DATA:  Right posterior hand pain after dropping heavy object onto it earlier today. EXAM: RIGHT HAND - COMPLETE 3+ VIEW COMPARISON:  None. FINDINGS: There is no evidence of fracture or dislocation. There is no evidence of arthropathy or other focal bone abnormality. Soft tissues are unremarkable. IMPRESSION: Negative. Electronically Signed   By: Rolm Baptise M.D.   On: 05/16/2015 12:26   I HAVE PERSONALLY  REVIEWED AND DISCUSSED RESULTS OF  X-RAYS WITH PATIENT  PRIOR TO DISCHARGE.  Both sets of films.    Cock up wrist splint applied along with high arm sling.   rx norco for pain.   Visual Acuity Review  Right Eye Distance:   Left Eye Distance:   Bilateral Distance:    Right Eye Near:   Left Eye Near:    Bilateral Near:         MDM   1. Hand injury, right, sequela   2. Wrist pain, acute, right   new  Patient is reassured that there are no issues that require transfer to higher level of care at this time or additional tests. Patient is advised to continue home symptomatic treatment. Patient is advised that if there are new or worsening symptoms to attend the emergency department, contact primary care provider, or return to UC. Instructions of care provided discharged home in stable condition.    THIS NOTE WAS GENERATED USING A VOICE RECOGNITION SOFTWARE PROGRAM. ALL REASONABLE EFFORTS  WERE MADE TO PROOFREAD THIS DOCUMENT FOR ACCURACY.  I have verbally reviewed the discharge  instructions with the patient. A printed AVS was given to the patient.  All questions were answered prior to discharge.      Konrad Felix, PA 05/18/15 1037

## 2015-05-18 NOTE — ED Notes (Signed)
Patient here for WC follow-up.  Patient states that the pain in his right hand has not improved.

## 2015-05-21 ENCOUNTER — Encounter: Payer: Self-pay | Admitting: Emergency Medicine

## 2015-05-21 ENCOUNTER — Ambulatory Visit
Admission: EM | Admit: 2015-05-21 | Discharge: 2015-05-21 | Disposition: A | Discharge status: Home / Self Care | Payer: Worker's Compensation | Attending: Family Medicine | Admitting: Family Medicine

## 2015-05-21 DIAGNOSIS — S60221D Contusion of right hand, subsequent encounter: Secondary | ICD-10-CM | POA: Diagnosis not present

## 2015-05-21 NOTE — ED Provider Notes (Signed)
CSN: [id number]     Arrival date & time 05/21/15  0813 History   First MD Initiated Contact with Patient 05/21/15 0845     Chief Complaint  Patient presents with  . Follow-up   (Consider location/radiation/quality/duration/timing/severity/associated sxs/prior Treatment) HPI Comments: 50 yo male seen last week with a work injury/right hand contusion, x-rays negative. States doing much better. Denies any pain or problems with range of motion.   The history is provided by the patient.    Past Medical History  Diagnosis Date  . Coronary artery disease   . Hypertension   . Hodgkin's lymphoma ([place]) 1994    neck/midchest area with radiation  . Left main coronary artery disease 10/22/2011  . S/P radiation therapy 10/21/1992    Radiation therapy to chest and neck for Hodgkin's lymphoma  . Unstable angina ([place]) 10/22/2011  . Hyperlipidemia 10/23/2011  . Family history of coronary artery disease 10/23/2011  . 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 Cardiac catheterization    . Carpal tunnel release    . Lymph node biopsy  1994    left neck  . Coronary artery bypass graft  10/24/2011    Procedure: CORONARY ARTERY BYPASS GRAFTING (CABG);  Surgeon: Rexene Alberts, MD;  Location: [place];  Service: Open Heart Surgery;  Laterality: N/A;  Coronary Artery Bypass Grafting X 3 Using Left Internal  Mammary Artery, Right Internal Mammary Artery and Right Radial Artery  . Radial artery harvest  10/24/2011    Procedure: RADIAL ARTERY HARVEST;  Surgeon: Rexene Alberts, MD;  Location: [facility] OR;  Service: Vascular;  Laterality: Left;   Family History  Problem Relation Age of Onset  . Coronary artery disease     Social History  Substance Use Topics  . Smoking status: Former Smoker -- 0.50 packs/day    Quit date: 10/21/1996  . Smokeless tobacco: Former Systems developer    Types: Snuff  . Alcohol Use: No    Review of  Systems  Allergies  Review of patient's allergies indicates no known allergies.  Home Medications   Prior to Admission medications   Medication Sig Start Date End Date Taking? Authorizing Provider  aspirin EC 325 MG EC tablet Take 1 tablet (325 mg total) by mouth daily. 10/27/11   Wayne E Gold, PA-C  HYDROcodone-acetaminophen (NORCO/VICODIN) 5-325 MG tablet Take 1 tablet by mouth every 4 (four) hours as needed. 05/18/15   Konrad Felix, PA  lisinopril-hydrochlorothiazide (PRINZIDE,ZESTORETIC) 10-12.5 MG per tablet Take 1 tablet by mouth daily. 10/28/11   Donielle Liston Alba, PA-C  metoprolol tartrate (LOPRESSOR) 25 MG tablet Take 1 tablet (25 mg total) by mouth 2 (two) times daily. 10/27/11   John Giovanni, PA-C   Meds Ordered and Administered this Visit  Medications - No data to display  BP 127/76 mmHg  Pulse 62  Temp(Src) 97.9 F (36.6 C) (Oral)  Resp 18  Ht 5\' 8"  (1.727 m)  Wt 210 lb (95.255 kg)  BMI 31.94 kg/m2  SpO2 98% No data found.   Physical Exam  Constitutional: He appears well-developed and well-nourished. No distress.  Musculoskeletal:       Right hand: He exhibits swelling (milld/minimal edema on dorsum of right hand). He exhibits normal range of motion, no tenderness, no bony tenderness, normal two-point discrimination, normal capillary refill, no deformity and no laceration. Normal sensation noted. Normal strength noted.  Skin: No rash noted. He is not diaphoretic. No erythema.  Nursing note and vitals reviewed.   ED Course  Procedures (including critical care time)  Labs Review Labs Reviewed - No data to display  Imaging Review No results found.   Visual Acuity Review  Right Eye Distance:   Left Eye Distance:   Bilateral Distance:    Right Eye Near:   Left Eye Near:    Bilateral Near:         MDM   1. Hand contusion, right, subsequent encounter   (resolved)  1. diagnosis reviewed with patient 2. Recommend supportive treatment with  continued ice prn, otc analgesics prn 3. May return to work full duty 4. Follow-up prn   Norval Gable, MD 05/21/15 1458

## 2015-05-21 NOTE — ED Notes (Signed)
Pt here for return to work follow up after being seen last week for an injury to his right hand.

## 2015-09-03 ENCOUNTER — Ambulatory Visit (INDEPENDENT_AMBULATORY_CARE_PROVIDER_SITE_OTHER): Payer: Managed Care, Other (non HMO) | Admitting: Cardiovascular Disease

## 2015-09-03 ENCOUNTER — Other Ambulatory Visit
Admission: RE | Admit: 2015-09-03 | Discharge: 2015-09-03 | Disposition: A | Discharge status: Home / Self Care | Payer: Managed Care, Other (non HMO) | Source: Ambulatory Visit | Attending: Cardiovascular Disease | Admitting: Cardiovascular Disease

## 2015-09-03 ENCOUNTER — Encounter: Payer: Self-pay | Admitting: Cardiovascular Disease

## 2015-09-03 VITALS — BP 108/80 | HR 59 | Ht 68.0 in | Wt 217.2 lb

## 2015-09-03 DIAGNOSIS — Z5189 Encounter for other specified aftercare: Secondary | ICD-10-CM

## 2015-09-03 DIAGNOSIS — E875 Hyperkalemia: Secondary | ICD-10-CM | POA: Diagnosis present

## 2015-09-03 DIAGNOSIS — I420 Dilated cardiomyopathy: Secondary | ICD-10-CM

## 2015-09-03 DIAGNOSIS — R0602 Shortness of breath: Secondary | ICD-10-CM

## 2015-09-03 DIAGNOSIS — I200 Unstable angina: Secondary | ICD-10-CM | POA: Diagnosis present

## 2015-09-03 DIAGNOSIS — I5022 Chronic systolic (congestive) heart failure: Secondary | ICD-10-CM

## 2015-09-03 DIAGNOSIS — C8190 Hodgkin lymphoma, unspecified, unspecified site: Secondary | ICD-10-CM

## 2015-09-03 DIAGNOSIS — I2510 Atherosclerotic heart disease of native coronary artery without angina pectoris: Secondary | ICD-10-CM

## 2015-09-03 DIAGNOSIS — Z923 Personal history of irradiation: Secondary | ICD-10-CM

## 2015-09-03 DIAGNOSIS — Z951 Presence of aortocoronary bypass graft: Secondary | ICD-10-CM

## 2015-09-03 LAB — BASIC METABOLIC PANEL
ANION GAP: 7 (ref 5–15)
BUN: 14 mg/dL (ref 6–20)
CO2: 26 mmol/L (ref 22–32)
Calcium: 9.5 mg/dL (ref 8.9–10.3)
Chloride: 107 mmol/L (ref 101–111)
Creatinine, Ser: 1.08 mg/dL (ref 0.61–1.24)
Glucose, Bld: 96 mg/dL (ref 65–99)
POTASSIUM: 4.3 mmol/L (ref 3.5–5.1)
SODIUM: 140 mmol/L (ref 135–145)

## 2015-09-03 MED ORDER — CARVEDILOL 6.25 MG PO TABS: 6.2500 mg | ORAL_TABLET | Freq: Two times a day (BID) | ORAL | 3 refills | Status: DC

## 2015-09-03 MED ORDER — ROSUVASTATIN CALCIUM 20 MG PO TABS: 20.0000 mg | ORAL_TABLET | Freq: Every day | ORAL | 3 refills | Status: DC

## 2015-09-03 NOTE — Progress Notes (Signed)
Cardiology Office Note  Date:  09/03/2015   ID:  Willie [name], DOB Aug 14, 1965, MRN [id number]  PCP:  Tracie Harrier, MD   Chief Complaint  Patient presents with  . Other    C/o chest pain with exertion. Meds reviewed verbally with pt.    HPI:  50 year old gentleman with history of Hodgkin's lymphoma with treatment in 1994, 1995 including chemotherapy and radiation , subsequent coronary artery disease, bypass surgery 3 with a LIMA, left radial, RIMA performed at [facility] hospital October 2013, cardiomyopathy with ejection fraction 30% noted prior to bypass surgery with no significant improvement since that time, who presents By self-referral to establish care in the [facility] office for management of his cardiomyopathy. History of hyperlipidemia chronic systolic CHF, prior smoking history, quit 22 years ago Left bundle branch block noted prior to bypass surgery  Prior to the bypass surgery his legs felt heavy, had significant fatigue He had abnormal EKG leading to echocardiogram and stress test which led to catheterization and then bypass surgery He reports that he felt well after bypass surgery, completed heart track at [facility] Was able to exercise for 30 minutes on a treadmill. Since that time weight has increased up at least 30 pounds, no regular exercise program  Outside Echo 2016: EF 30 Severe MR?, moderate TR, RVSP 47+ RA  Outside echo, Ejection fraction 25%  On 03/14/15 Moderate MR  Wife is concerned about weight gain, 35 pounds affecting his breathing and energy Unclear if there is new blockage causing his symptoms Recently started on low-dose entresto , potassium up to 5.4 in April 2017.  No recheck since that time Also on metoprolol, feels fatigue lab work reviewed, cholesterol elevated, 170s, LDL above 90 on current dose of Crestor  He denies leg swelling, weight gain from fluid, PND or orthopnea  EKG on today's visit shows normal sinus rhythm with rate 59 bpm, left bundle  branch block, rare PVC  Other past medical history reviewed History of Hodgkin's lymphoma, hypertension, left bundle branch block Quit smoking 21 yrs back ( 1/2 ppd x 5-6 yrs)  Catheterization report reviewed showing 85% left main disease, 60% RCA disease, 50% circumflex disease   PMH:   has a past medical history of Coronary artery disease; Family history of coronary artery disease (10/23/2011); Hodgkin's lymphoma ([place]) (1994); Hyperlipidemia (10/23/2011); Hypertension; Left main coronary artery disease (10/22/2011); 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  PSH:    Past Surgical History:  Procedure Laterality Date  . CARDIAC CATHETERIZATION    . CARPAL TUNNEL RELEASE    . CORONARY ARTERY BYPASS GRAFT  10/24/2011   Procedure: CORONARY ARTERY BYPASS GRAFTING (CABG);  Surgeon: Rexene Alberts, MD;  Location: [place];  Service: Open Heart Surgery;  Laterality: N/A;  Coronary Artery Bypass Grafting X 3 Using Left Internal  Mammary Artery, Right Internal Mammary Artery and Right Radial Artery  . LYMPH NODE BIOPSY  1994   left neck  . RADIAL ARTERY HARVEST  10/24/2011   Procedure: RADIAL ARTERY HARVEST;  Surgeon: Rexene Alberts, MD;  Location: [place];  Service: Vascular;  Laterality: Left;    Current Outpatient Prescriptions  Medication Sig Dispense Refill  . allopurinol (ZYLOPRIM) 300 MG tablet Take 300 mg by mouth daily.    Marland Kitchen aspirin EC 81 MG tablet Take 81 mg by mouth daily.    . rosuvastatin (CRESTOR) 20 MG tablet Take 1 tablet (20 mg total) by mouth daily. 90 tablet 3  .  sacubitril-valsartan (ENTRESTO) 24-26 MG Take 1 tablet by mouth 2 (two) times daily.    . sennosides-docusate sodium (SENOKOT-S) 8.6-50 MG tablet Take 4 tablets by mouth daily.    . vitamin B-12 (CYANOCOBALAMIN) 1000 MCG tablet Take 1,000 mcg by mouth daily.    . carvedilol (COREG) 6.25 MG tablet Take 1 tablet (6.25 mg total) by mouth 2 (two) times daily.  180 tablet 3   No current facility-administered medications for this visit.      Allergies:   Review of patient's allergies indicates no known allergies.   Social History:  The patient  reports that he quit smoking about 18 years ago. He smoked 0.50 packs per day. His smokeless tobacco use includes Snuff. He reports that he does not drink alcohol or use drugs.   Family History:   family history includes Heart attack in his father; Heart disease in his mother.    Review of Systems: Review of Systems  Constitutional: Positive for malaise/fatigue.  Respiratory: Positive for shortness of breath.   Cardiovascular: Negative.   Gastrointestinal: Negative.   Musculoskeletal: Negative.   Neurological: Positive for weakness.  Psychiatric/Behavioral: Negative.   All other systems reviewed and are negative.    PHYSICAL EXAM: VS:  BP 108/80 (BP Location: Right Arm, Patient Position: Sitting, Cuff Size: Normal)   Pulse (!) 59   Ht 5\' 8"  (1.727 m)   Wt 217 lb 4 oz (98.5 kg)   BMI 33.03 kg/m  , BMI Body mass index is 33.03 kg/m. GEN: Well nourished, well developed, in no acute distress  HEENT: normal  Neck: no JVD, carotid bruits, or masses Cardiac: RRR; no murmurs, rubs, or gallops,no edema  Respiratory:  clear to auscultation bilaterally, normal work of breathing GI: soft, nontender, nondistended, + BS MS: no deformity or atrophy  Skin: warm and dry, no rash Neuro:  Strength and sensation are intact Psych: euthymic mood, full affect    Recent Labs: 09/03/2015: BUN 14; Creatinine, Ser 1.08; Potassium 4.3; Sodium 140    Lipid Panel Lab Results  Component Value Date   CHOL 245 (H) 10/23/2011   HDL 26 (L) 10/23/2011   LDLCALC 166 (H) 10/23/2011   TRIG 265 (H) 10/23/2011      Wt Readings from Last 3 Encounters:  09/03/15 217 lb 4 oz (98.5 kg)  05/21/15 210 lb (95.3 kg)  05/18/15 210 lb (95.3 kg)       ASSESSMENT AND PLAN:  Left main coronary artery disease - Plan:  EKG 12-Lead, NM Myocar Multi W/Spect W/Wall Motion / EF,  Symptoms concerning for angina, worse on exertion Long discussion about various treatment options. Discussed various types of stress testing and risk versus benefits when compared to catheterization. After long discussion, he prefers pharmacologic Myoview this will be scheduled at his convenience  Unstable angina ([place]) - Plan: EKG 12-Lead, NM Myocar Multi W/Spect W/Wall Motion / EF, CANCELED: Basic Metabolic Panel (BMET),  Stress test as above Unable to rule out graft failure given recurrent symptoms of shortness of breath equivalent to his previous anginal symptoms, Also need to consider his dramatic weight gain, deconditioning Depending on the outcome of his stress test, will recommend a regular exercise program, weight loss For his stable angina, if stress test shows no large regions of ischemia, potentially could reenroll in cardiac rehabilitation  Serum potassium elevated -  Recheck BMP today, potassium 5. for several months ago If potassium back to normal, potentially could increase dose of entresto  if potassium continues to run  high, may need to stop the entresto.   Shortness of breath  etiology unclear, possibly from ischemia Unable to exclude weight gain, deconditioning  S/P CABG x 3  details discussed with him in detail, prior cardiac catheterization reviewed in detail  S/P radiation therapy  previous Hodgkin's lymphoma, radiation likely contributing to underlying coronary disease   Hodgkin lymphoma, unspecified Hodgkin lymphoma type, unspecified body region [facility])  remote history in 1994   Congestive dilated cardiomyopathy ([place])  cardiopathy likely nonischemic given no significant improvement in ejection fraction following revascularization . EF was moderately depressed prior to bypass surgery  Discussed risk and benefit of defibrillator  Recommended aggressive medical management first before seeking advice from EP   He does have left bundle branch block   Chronic systolic CHF (congestive heart failure) ([place]) Appears relatively euvolemic. He is not taking Lasix, reports weight is stable, no leg edema   if shortness of breath gets worse, will try Lasix daily   Total encounter time more than 60 minutes  Greater than 50% was spent in counseling and coordination of care with the patient   Disposition:   F/U  1 month   Orders Placed This Encounter  Procedures  . NM Myocar Multi W/Spect W/Wall Motion / EF  . EKG 12-Lead     Signed, Esmond Plants, M.D., Ph.D. 09/03/2015  [place], [place]

## 2015-09-03 NOTE — Patient Instructions (Addendum)
Medication Instructions:   Please slowly increase the crestor up to 20 mg daily  Stop the metoprolol Start coreg 6.25 mg twice a day  Labwork:  BMP today, potassium elevated  Testing/Procedures:  Treadmill myoview for angina symptoms [place]  Your caregiver has ordered a Stress Test with nuclear imaging. The purpose of this test is to evaluate the blood supply to your heart muscle. This procedure is referred to as a "Non-Invasive Stress Test." This is because other than having an IV started in your vein, nothing is inserted or "invades" your body. Cardiac stress tests are done to find areas of poor blood flow to the heart by determining the extent of coronary artery disease (CAD). Some patients exercise on a treadmill, which naturally increases the blood flow to your heart, while others who are  unable to walk on a treadmill due to physical limitations have a pharmacologic/chemical stress agent called Lexiscan . This medicine will mimic walking on a treadmill by temporarily increasing your coronary blood flow.   Please note: these test may take anywhere between 2-4 hours to complete  PLEASE REPORT TO [facility] AT THE FIRST DESK WILL DIRECT YOU WHERE TO GO  Date of Procedure:___Wednesday, August 30____  Arrival Time for Procedure:___8:15 am___________  Instructions regarding medication:   __X__:  Hold COREG the night before and morning of procedure  How to prepare for your Myoview test:  1. Do not eat or drink after midnight 2. No caffeine for 24 hours prior to test 3. No smoking 24 hours prior to test. 4. Your medication may be taken with water.  If your doctor stopped a medication because of this test, do not take that medication. 5. Please wear a short sleeve shirt. 6. No perfume, cologne or lotion. 7. Wear comfortable walking shoes. No heels!  Follow-Up: It was a pleasure seeing you in the office today. Please call us if you have new  issues that need to be addressed before your next appt.  [phone]  Your physician wants you to follow-up in: 1 months.  You will receive a reminder letter in the mail two months in advance. If you don't receive a letter, please call our office to schedule the follow-up appointment.  If you need a refill on your cardiac medications before your next appointment, please call your pharmacy.   Cardiac Nuclear Scanning A cardiac nuclear scan is used to check your heart for problems, such as the following:  A portion of the heart is not getting enough blood.  Part of the heart muscle has died, which happens with a heart attack.  The heart wall is not working normally.  In this test, a radioactive dye (tracer) is injected into your bloodstream. After the tracer has traveled to your heart, a scanning device is used to measure how much of the tracer is absorbed by or distributed to various areas of your heart. LET [facility] CARE PROVIDER KNOW ABOUT:  Any allergies you have.  All medicines you are taking, including vitamins, herbs, eye drops, creams, and over-the-counter medicines.  Previous problems you or members of your family have had with the use of anesthetics.  Any blood disorders you have.  Previous surgeries you have had.  Medical conditions you have.  RISKS AND COMPLICATIONS Generally, this is a safe procedure. However, as with any procedure, problems can occur. Possible problems include:   Serious chest pain.  Rapid heartbeat.  Sensation of warmth in your chest. This  usually passes quickly. BEFORE THE PROCEDURE Ask your health care provider about changing or stopping your regular medicines. PROCEDURE This procedure is usually done at a hospital and takes 2-4 hours.  An IV tube is inserted into one of your veins.  Your health care provider will inject a small amount of radioactive tracer through the tube.  You will then wait for 20-40 minutes while the tracer  travels through your bloodstream.  You will lie down on an exam table so images of your heart can be taken. Images will be taken for about 15-20 minutes.  You will exercise on a treadmill or stationary bike. While you exercise, your heart activity will be monitored with an electrocardiogram (ECG), and your blood pressure will be checked.  If you are unable to exercise, you may be given a medicine to make your heart beat faster.  When blood flow to your heart has peaked, tracer will again be injected through the IV tube.  After 20-40 minutes, you will get back on the exam table and have more images taken of your heart.  When the procedure is over, your IV tube will be removed. AFTER THE PROCEDURE  You will likely be able to leave shortly after the test. Unless your health care provider tells you otherwise, you may return to your normal schedule, including diet, activities, and medicines.  Make sure you find out how and when you will get your test results.   This information is not intended to replace advice given to you by your health care provider. Make sure you discuss any questions you have with your health care provider.   Document Released: 01/18/2004 Document Revised: 12/28/2012 Document Reviewed: 12/01/2012 Elsevier Interactive Patient Education 2016 [place].  Angina Pectoris Angina pectoris, often called angina, is extreme discomfort in the chest, neck, or arm. This is caused by a lack of blood in the middle and thickest layer of the heart wall (myocardium). There are four types of angina:  Stable angina. Stable angina usually occurs in episodes of predictable frequency and duration. It is usually brought on by physical activity, stress, or excitement. Stable angina usually lasts a few minutes and can often be relieved by a medicine that you place under your tongue. This medicine is called sublingual nitroglycerin.  Unstable angina. Unstable angina can occur even when you  are doing little or no physical activity. It can even occur while you are sleeping or when you are at rest. It can suddenly increase in severity or frequency. It may not be relieved by sublingual nitroglycerin, and it can last up to 30 minutes.  Microvascular angina. This type of angina is caused by a disorder of tiny blood vessels called arterioles. Microvascular angina is more common in women. The pain may be more severe and last longer than other types of angina pectoris.  Prinzmetal or variant angina. This type of angina pectoris is rare and usually occurs when you are doing little or no physical activity. It especially occurs in the early morning hours. CAUSES Atherosclerosis is the cause of angina. This is the buildup of fat and cholesterol (plaque) on the inside of the arteries. Over time, the plaque may narrow or block the artery, and this will lessen blood flow to the heart. Plaque can also become weak and break off within a coronary artery to form a clot and cause a sudden blockage. RISK FACTORS Risk factors common to both men and women include:  High cholesterol levels.  High blood pressure (  hypertension).  Tobacco use.  Diabetes.  Family history of angina.  Obesity.  Lack of exercise.  A diet high in saturated fats. Women are at greater risk for angina if they are:  Over age 27.  Postmenopausal. SYMPTOMS Many people do not experience any symptoms during the early stages of angina. As the condition progresses, symptoms common to both men and women may include:  Chest pain.  The pain can be described as a crushing or squeezing in the chest, or a tightness, pressure, fullness, or heaviness in the chest.  The pain can last more than a few minutes, or it can stop and recur.  Pain in the arms, neck, jaw, or back.  Unexplained heartburn or indigestion.  Shortness of breath.  Nausea.  Sudden cold sweats.  Sudden light-headedness. Many women have chest discomfort  and some of the other symptoms. However, women often have different (atypical) symptoms, such as:   Fatigue.  Unexplained feelings of nervousness or anxiety.  Unexplained weakness.  Dizziness or fainting. Sometimes, women may have angina without any symptoms. DIAGNOSIS  Tests to diagnose angina may include:  ECG (electrocardiogram).  Exercise stress test. This looks for signs of blockage when the heart is being exercised.  Pharmacologic stress test. This test looks for signs of blockage when the heart is being stressed with a medicine.  Blood tests.  Coronary angiogram. This is a procedure to look at the coronary arteries to see if there is any blockage. TREATMENT  The treatment of angina may include the following:  Healthy behavioral changes to reduce or control risk factors.  Medicine.  Coronary stenting.A stent helps to keep an artery open.  Coronary angioplasty. This procedure widens a narrowed or blocked artery.  Coronary arterybypass surgery. This will allow your blood to pass the blockage (bypass) to reach your heart. HOME CARE INSTRUCTIONS   Take medicines only as directed by your health care provider.  Do not take the following medicines unless your health care provider approves:  Nonsteroidal anti-inflammatory drugs (NSAIDs), such as ibuprofen, naproxen, or celecoxib.  Vitamin supplements that contain vitamin A, vitamin E, or both.  Hormone replacement therapy that contains estrogen with or without progestin.  Manage other health conditions such as hypertension and diabetes as directed by your health care provider.  Follow a heart-healthy diet. A dietitian can help to educate you about healthy food options and changes.  Use healthy cooking methods such as roasting, grilling, broiling, baking, poaching, steaming, or stir-frying. Talk to a dietitian to learn more about healthy cooking methods.  Follow an exercise program approved by your health care  provider.  Maintain a healthy weight. Lose weight as approved by your health care provider.  Plan rest periods when fatigued.  Learn to manage stress.  Do not use any tobacco products, including cigarettes, chewing tobacco, or electronic cigarettes. If you need help quitting, ask your health care provider.  If you drink alcohol, and your health care provider approves, limit your alcohol intake to no more than 1 drink per day. One drink equals 12 ounces of beer, 5 ounces of wine, or 1 ounces of hard liquor.  Stop illegal drug use.  Keep all follow-up visits as directed by your health care provider. This is important. SEEK IMMEDIATE MEDICAL CARE IF:   You have pain in your chest, neck, arm, jaw, stomach, or back that lasts more than a few minutes, is recurring, or is unrelieved by taking sublingualnitroglycerin.  You have profuse sweating without cause.  You  have unexplained:  Heartburn or indigestion.  Shortness of breath or difficulty breathing.  Nausea or vomiting.  Fatigue.  Feelings of nervousness or anxiety.  Weakness.  Diarrhea.  You have sudden light-headedness or dizziness.  You faint. These symptoms may represent a serious problem that is an emergency. Do not wait to see if the symptoms will go away. Get medical help right away. Call your local emergency services (911 in the [place] Do not drive yourself to the hospital.   This information is not intended to replace advice given to you by your health care provider. Make sure you discuss any questions you have with your health care provider.   Document Released: 12/23/2004 Document Revised: 01/13/2014 Document Reviewed: 04/26/2013 Elsevier Interactive Patient Education [employer].

## 2015-09-05 ENCOUNTER — Ambulatory Visit
Admission: RE | Admit: 2015-09-05 | Discharge: 2015-09-05 | Disposition: A | Discharge status: Home / Self Care | Payer: Managed Care, Other (non HMO) | Source: Ambulatory Visit | Attending: Cardiovascular Disease | Admitting: Cardiovascular Disease

## 2015-09-05 DIAGNOSIS — R931 Abnormal findings on diagnostic imaging of heart and coronary circulation: Secondary | ICD-10-CM | POA: Insufficient documentation

## 2015-09-05 DIAGNOSIS — I200 Unstable angina: Secondary | ICD-10-CM | POA: Diagnosis not present

## 2015-09-05 DIAGNOSIS — I2510 Atherosclerotic heart disease of native coronary artery without angina pectoris: Secondary | ICD-10-CM | POA: Insufficient documentation

## 2015-09-05 LAB — NM MYOCAR MULTI W/SPECT W/WALL MOTION / EF
Estimated workload: 7 METS
Exercise duration (min): 4 min
Exercise duration (sec): 0 s
LV dias vol: 197 mL (ref 62–150)
LV sys vol: 139 mL
Peak HR: 157 {beats}/min
Percent HR: 92 %
Rest HR: 59 {beats}/min
SDS: 4
SRS: 7
SSS: 6
TID: 1.04

## 2015-09-05 MED ORDER — TECHNETIUM TC 99M TETROFOSMIN IV KIT
32.3700 | PACK | Freq: Once | INTRAVENOUS | Status: AC | PRN
  Administered 2015-09-05: 32.37 via INTRAVENOUS

## 2015-09-05 MED ORDER — TECHNETIUM TC 99M TETROFOSMIN IV KIT
13.0000 | PACK | Freq: Once | INTRAVENOUS | Status: AC | PRN
  Administered 2015-09-05: 12.96 via INTRAVENOUS

## 2015-09-07 ENCOUNTER — Encounter: Payer: Self-pay | Admitting: Cardiovascular Disease

## 2015-09-11 ENCOUNTER — Encounter: Payer: Self-pay | Admitting: Cardiovascular Disease

## 2015-09-11 ENCOUNTER — Other Ambulatory Visit: Payer: Self-pay

## 2015-09-11 DIAGNOSIS — I200 Unstable angina: Secondary | ICD-10-CM

## 2015-09-11 DIAGNOSIS — I2510 Atherosclerotic heart disease of native coronary artery without angina pectoris: Secondary | ICD-10-CM

## 2015-09-11 MED ORDER — SACUBITRIL-VALSARTAN 49-51 MG PO TABS: 1.0000 | ORAL_TABLET | Freq: Two times a day (BID) | ORAL | Status: AC

## 2015-10-10 ENCOUNTER — Ambulatory Visit (INDEPENDENT_AMBULATORY_CARE_PROVIDER_SITE_OTHER): Payer: Managed Care, Other (non HMO) | Admitting: Cardiovascular Disease

## 2015-10-10 ENCOUNTER — Encounter: Payer: Self-pay | Admitting: Cardiovascular Disease

## 2015-10-10 VITALS — BP 122/80 | HR 74 | Ht 68.0 in | Wt 216.8 lb

## 2015-10-10 DIAGNOSIS — Z951 Presence of aortocoronary bypass graft: Secondary | ICD-10-CM | POA: Diagnosis not present

## 2015-10-10 DIAGNOSIS — I2510 Atherosclerotic heart disease of native coronary artery without angina pectoris: Secondary | ICD-10-CM

## 2015-10-10 DIAGNOSIS — I208 Other forms of angina pectoris: Secondary | ICD-10-CM

## 2015-10-10 DIAGNOSIS — E7800 Pure hypercholesterolemia, unspecified: Secondary | ICD-10-CM

## 2015-10-10 DIAGNOSIS — I255 Ischemic cardiomyopathy: Secondary | ICD-10-CM | POA: Insufficient documentation

## 2015-10-10 DIAGNOSIS — R0602 Shortness of breath: Secondary | ICD-10-CM

## 2015-10-10 MED ORDER — EZETIMIBE 10 MG PO TABS: 10.0000 mg | ORAL_TABLET | Freq: Every day | ORAL | 11 refills | Status: DC

## 2015-10-10 MED ORDER — ROSUVASTATIN CALCIUM 40 MG PO TABS: 40.0000 mg | ORAL_TABLET | Freq: Every day | ORAL | 3 refills | Status: DC

## 2015-10-10 MED ORDER — SACUBITRIL-VALSARTAN 49-51 MG PO TABS: 1.0000 | ORAL_TABLET | Freq: Two times a day (BID) | ORAL | 11 refills | Status: DC

## 2015-10-10 NOTE — Progress Notes (Signed)
Cardiology Office Note  Date:  10/10/2015   ID:  Willie [name], DOB 10-22-1965, MRN [id number]  PCP:  Tracie Harrier, MD   Chief Complaint  Patient presents with  . other    1 month follow up. Meds reviewed by the pt. verbally. "doing well."     HPI:  50 year old gentleman with history of Hodgkin's lymphoma with treatment in 1994, 1995 including chemotherapy and radiation , prior smoking history for 20 years, subsequent coronary artery disease, bypass surgery 3 with a LIMA, left radial, RIMA performed at [facility] hospital October 2013, cardiomyopathy with ejection fraction 30% noted prior to bypass surgery with no significant improvement since that time, who presents  for management of his cardiomyopathy. History of hyperlipidemia chronic systolic CHF,  Left bundle branch block noted prior to bypass surgery  Results of his recent stress test discussed with him Stress test: 09/05/15 Exercise myocardial perfusion imaging study with no significant  ischemia Fixed defect mid to apical anterior wall, apical septal region and apical region Anteroseptal and septal wall hypokinesis, EF estimated at 33%  Abnormal wall motion possibly secondary to left bundle branch block  He reports that overall he feels well with no chest pain or shortness of breath Tolerating low-dose entresto 24/26 milligrams twice a day Denies any lightheadedness or dizziness Recent BMP shows stable potassium  He denies leg swelling, weight gain from fluid, PND or orthopnea    Other past medical history Prior to the bypass surgery his legs felt heavy, had significant fatigue He had abnormal EKG leading to echocardiogram and stress test which led to catheterization and then bypass surgery  Outside Echo 2016: EF 30 Severe MR?, moderate TR, RVSP 47+ RA  Outside echo, Ejection fraction 25%  On 03/14/15 Moderate MR  History of Hodgkin's lymphoma, hypertension, left bundle branch block Quit smoking 21 yrs back ( 1/2 ppd  x 5-6 yrs)  Catheterization report reviewed showing 85% left main disease, 60% RCA disease, 50% circumflex disease   PMH:   has a past medical history of Coronary artery disease; Family history of coronary artery disease (10/23/2011); Hodgkin's lymphoma ([place]) (1994); Hyperlipidemia (10/23/2011); Hypertension; Left main coronary artery disease (10/22/2011); 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  PSH:    Past Surgical History:  Procedure Laterality Date  . CARDIAC CATHETERIZATION    . CARPAL TUNNEL RELEASE    . CORONARY ARTERY BYPASS GRAFT  10/24/2011   Procedure: CORONARY ARTERY BYPASS GRAFTING (CABG);  Surgeon: Rexene Alberts, MD;  Location: [place];  Service: Open Heart Surgery;  Laterality: N/A;  Coronary Artery Bypass Grafting X 3 Using Left Internal  Mammary Artery, Right Internal Mammary Artery and Right Radial Artery  . LYMPH NODE BIOPSY  1994   left neck  . RADIAL ARTERY HARVEST  10/24/2011   Procedure: RADIAL ARTERY HARVEST;  Surgeon: Rexene Alberts, MD;  Location: [facility];  Service: Vascular;  Laterality: Left;    Current Outpatient Prescriptions  Medication Sig Dispense Refill  . allopurinol (ZYLOPRIM) 300 MG tablet Take 300 mg by mouth daily.    Marland Kitchen aspirin EC 81 MG tablet Take 81 mg by mouth daily.    . carvedilol (COREG) 6.25 MG tablet Take 1 tablet (6.25 mg total) by mouth 2 (two) times daily. 180 tablet 3  . ENTRESTO 24-26 MG 1 tablet 2 (two) times daily.     . rosuvastatin (CRESTOR) 20 MG tablet Take 1 tablet (20 mg total) by mouth daily. [place]  tablet 3  . sennosides-docusate sodium (SENOKOT-S) 8.6-50 MG tablet Take 4 tablets by mouth daily.    . vitamin B-12 (CYANOCOBALAMIN) 1000 MCG tablet Take 1,000 mcg by mouth daily.     Current Facility-Administered Medications  Medication Dose Route Frequency Provider Last Rate Last Dose  . sacubitril-valsartan (ENTRESTO) 49-51 mg per tablet  1 tablet Oral BID Minna Merritts, MD         Allergies:   Review of patient's allergies indicates no known allergies.   Social History:  The patient  reports that he quit smoking about 18 years ago. He smoked 0.50 packs per day. His smokeless tobacco use includes Snuff. He reports that he does not drink alcohol or use drugs.   Family History:   family history includes Heart attack in his father; Heart disease in his mother.    Review of Systems: Review of Systems  Constitutional: Positive for malaise/fatigue.  Respiratory: Positive for shortness of breath.   Cardiovascular: Negative.   Gastrointestinal: Negative.   Musculoskeletal: Negative.   Neurological: Positive for weakness.  Psychiatric/Behavioral: Negative.   All other systems reviewed and are negative.    PHYSICAL EXAM: VS:  BP 122/80 (BP Location: Left Arm, Patient Position: Sitting, Cuff Size: Normal)   Pulse 74   Ht 5\' 8"  (1.727 m)   Wt 216 lb 12 oz (98.3 kg)   BMI 32.96 kg/m  , BMI Body mass index is 32.96 kg/m. GEN: Well nourished, well developed, in no acute distress  HEENT: normal  Neck: no JVD, carotid bruits, or masses Cardiac: RRR; no murmurs, rubs, or gallops,no edema  Respiratory:  clear to auscultation bilaterally, normal work of breathing GI: soft, nontender, nondistended, + BS MS: no deformity or atrophy  Skin: warm and dry, no rash Neuro:  Strength and sensation are intact Psych: euthymic mood, full affect    Recent Labs: 09/03/2015: BUN 14; Creatinine, Ser 1.08; Potassium 4.3; Sodium 140    Lipid Panel Lab Results  Component Value Date   CHOL 245 (H) 10/23/2011   HDL 26 (L) 10/23/2011   LDLCALC 166 (H) 10/23/2011   TRIG 265 (H) 10/23/2011      Wt Readings from Last 3 Encounters:  10/10/15 216 lb 12 oz (98.3 kg)  09/03/15 217 lb 4 oz (98.5 kg)  05/21/15 210 lb (95.3 kg)       ASSESSMENT AND PLAN:  Left main coronary artery disease - Recent stress test results discussed with him in detail Denies any  symptoms concerning for angina No further testing at this time  Stable angina Reports that he feels well, shortness of breath slowly improving If symptoms get worse, could consider cardiac rehabilitation  Shortness of breath Underlying cardiomyopathy, weight gain, deconditioning Reports he is doing better with regular exertion, deconditioning We'll increase the entresto up to 49/51 mg twice a day  S/P CABG x 3 prior cardiac catheterization reviewed  S/P radiation therapy  previous Hodgkin's lymphoma, radiation likely contributing to underlying coronary disease   Hodgkin lymphoma, unspecified Hodgkin lymphoma type, unspecified body region [facility])  remote history in 1994   Congestive dilated cardiomyopathy ([place]) no significant improvement in ejection fraction following revascularization .  EF was moderately depressed prior to bypass surgery  We'll continue aggressive medical management for now, reevaluate ejection fraction at a later date, consider ICD shock  Chronic systolic CHF (congestive heart failure) ([place]) Appears relatively euvolemic  if shortness of breath gets worse, will try Lasix daily    Total encounter time  more than 25 minutes  Greater than 50% was spent in counseling and coordination of care with the patient   Disposition:   F/U  6 month   No orders of the defined types were placed in this encounter.    Signed, Esmond Plants, M.D., Ph.D. 10/10/2015  [place], [place]

## 2015-10-10 NOTE — Patient Instructions (Addendum)
Medication Instructions:   Please increase the entresto up to 49/51 mg twice a day  Please increase the crestor up to 40 mg daily Please add zetia for additional 10% drop in cholesterol Goal total chol <150, goal LDL <70  Labwork:  No new labs needed  Testing/Procedures:  No further testing at this time   Follow-Up: It was a pleasure seeing you in the office today. Please call us if you have new issues that need to be addressed before your next appt.  [phone]  Your physician wants you to follow-up in: 6 months.  You will receive a reminder letter in the mail two months in advance. If you don't receive a letter, please call our office to schedule the follow-up appointment.  If you need a refill on your cardiac medications before your next appointment, please call your pharmacy.

## 2016-03-24 ENCOUNTER — Encounter: Payer: Self-pay | Admitting: Cardiovascular Disease

## 2016-04-21 ENCOUNTER — Telehealth: Payer: Self-pay

## 2016-04-21 NOTE — Telephone Encounter (Signed)
Prior Authorization sent through [employer] for Entresto 49-51 mg take one tablet twice a day.

## 2016-04-21 NOTE — Telephone Encounter (Signed)
PA for Entresto 49-51 mg has been approved.  Notified the patient and [place] the [place] has been approved.

## 2016-04-30 NOTE — Progress Notes (Signed)
Cardiology Office Note  Date:  05/01/2016   ID:  Willie [name], DOB 1965/07/05, MRN [id number]  PCP:  Tracie Harrier, MD   Chief Complaint  Patient presents with  . other    6 month f/u no complaints today. Meds reviewed verbally with pt.    HPI:   51 year old gentleman with history of  Hodgkin's lymphoma with treatment in 1994, 1995 including chemotherapy and radiation ,  smoking history for 20 years,  coronary artery disease, bypass surgery 3 with  la LIMA,left radial, RIMA  October 2013, cardiomyopathy with ejection fraction 30% noted prior to bypass surgery  33% by stress test August 2017, no ischemia  hyperlipidemia  chronic systolic CHF, on entresto Left bundle branch block noted prior to bypass surgery Mild bilateral carotid disease in 2013 who presents  for management of his cardiomyopathy.  Stress test: 09/05/15 Exercise myocardial perfusion imaging study with no significant  ischemia Fixed defect mid to apical anterior wall, apical septal region and apical region Anteroseptal and septal wall hypokinesis, EF estimated at 33%  Abnormal wall motion possibly secondary to left bundle branch block  In follow-up, he presents with his wife Long discussion concerning his medications, previous coronary anatomy, previous surgical procedure Previous ejection fraction, need for defibrillator He feels he is doing well with no complaints, continues to work full time without symptoms of chest pain or shortness of breath  Reports he is tolerating entresto 49/51 mg milligrams twice a day Denies any lightheadedness or dizziness Recent BMP shows stable potassium, 5.0  He denies leg swelling, weight gain from fluid, PND or orthopnea  EKG personally reviewed by myself on todays visit Shows sinus bradycardia 57 bpm left bundle branch block  Other past medical history Prior to the bypass surgery his legs felt heavy, had significant fatigue He had abnormal EKG leading to  echocardiogram and stress test which led to catheterization and then bypass surgery  Outside Echo 2016: EF 30 Severe MR?, moderate TR, RVSP 47+ RA  Outside echo, Ejection fraction 25%  On 03/14/15 Moderate MR  History of Hodgkin's lymphoma, hypertension, left bundle branch block Quit smoking 21 yrs back ( 1/2 ppd x 5-6 yrs)  Catheterization report reviewed showing 85% left main disease, 60% RCA disease, 50% circumflex disease   PMH:   has a past medical history of Coronary artery disease; Family history of coronary artery disease (10/23/2011); Hodgkin's lymphoma ([place]) (1994); Hyperlipidemia (10/23/2011); Hypertension; Left main coronary artery disease (10/22/2011); 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  PSH:    Past Surgical History:  Procedure Laterality Date  . CARDIAC CATHETERIZATION    . CARPAL TUNNEL RELEASE    . CORONARY ARTERY BYPASS GRAFT  10/24/2011   Procedure: CORONARY ARTERY BYPASS GRAFTING (CABG);  Surgeon: Rexene Alberts, MD;  Location: [facility];  Service: Open Heart Surgery;  Laterality: N/A;  Coronary Artery Bypass Grafting X 3 Using Left Internal  Mammary Artery, Right Internal Mammary Artery and Right Radial Artery  . LYMPH NODE BIOPSY  1994   left neck  . RADIAL ARTERY HARVEST  10/24/2011   Procedure: RADIAL ARTERY HARVEST;  Surgeon: Rexene Alberts, MD;  Location: [facility];  Service: Vascular;  Laterality: Left;    Current Outpatient Prescriptions  Medication Sig Dispense Refill  . allopurinol (ZYLOPRIM) 300 MG tablet Take 300 mg by mouth daily.    Marland Kitchen aspirin EC 81 MG tablet Take 81 mg by mouth daily.    . carvedilol (  COREG) 6.25 MG tablet Take 1 tablet (6.25 mg total) by mouth 2 (two) times daily. 180 tablet 3  . ezetimibe (ZETIA) 10 MG tablet Take 1 tablet (10 mg total) by mouth daily. 30 tablet 11  . rosuvastatin (CRESTOR) 40 MG tablet Take 1 tablet (40 mg total) by mouth daily. 90 tablet 3  .  sacubitril-valsartan (ENTRESTO) 49-51 MG Take 1 tablet by mouth 2 (two) times daily. 60 tablet 11  . sennosides-docusate sodium (SENOKOT-S) 8.6-50 MG tablet Take 4 tablets by mouth daily.    . vitamin B-12 (CYANOCOBALAMIN) 1000 MCG tablet Take 1,000 mcg by mouth daily.     No current facility-administered medications for this visit.      Allergies:   Patient has no known allergies.   Social History:  The patient  reports that he quit smoking about 19 years ago. He smoked 0.50 packs per day. His smokeless tobacco use includes Snuff. He reports that he does not drink alcohol or use drugs.   Family History:   family history includes Heart attack in his father; Heart disease in his mother.    Review of Systems: Review of Systems  Constitutional: Negative.   Respiratory: Negative.   Cardiovascular: Negative.   Gastrointestinal: Negative.   Musculoskeletal: Negative.   Psychiatric/Behavioral: Negative.   All other systems reviewed and are negative.    PHYSICAL EXAM: VS:  BP 110/64 (BP Location: Left Arm, Patient Position: Sitting, Cuff Size: Normal)   Pulse (!) 57   Ht 5\' 8"  (1.727 m)   Wt 220 lb (99.8 kg)   BMI 33.45 kg/m  , BMI Body mass index is 33.45 kg/m. GEN: Well nourished, well developed, in no acute distress  HEENT: normal  Neck: no JVD, carotid bruits, or masses Cardiac: RRR; no murmurs, rubs, or gallops,no edema  Respiratory:  clear to auscultation bilaterally, normal work of breathing GI: soft, nontender, nondistended, + BS MS: no deformity or atrophy  Skin: warm and dry, no rash Neuro:  Strength and sensation are intact Psych: euthymic mood, full affect    Recent Labs: 09/03/2015: BUN 14; Creatinine, Ser 1.08; Potassium 4.3; Sodium 140    Lipid Panel Lab Results  Component Value Date   CHOL 245 (H) 10/23/2011   HDL 26 (L) 10/23/2011   LDLCALC 166 (H) 10/23/2011   TRIG 265 (H) 10/23/2011      Wt Readings from Last 3 Encounters:  05/01/16 220 lb  (99.8 kg)  10/10/15 216 lb 12 oz (98.3 kg)  09/03/15 217 lb 4 oz (98.5 kg)       ASSESSMENT AND PLAN:   Left main coronary artery disease - Denies any symptoms concerning for angina No further testing at this time  Stable angina Reports that he feels well, shortness of breath resolved  Shortness of breath Underlying cardiomyopathy, weight gain, deconditioning Reports he is doing better with regular exertion, deconditioning We'll increase the entresto up to 49/51 mg twice a day  S/P CABG x 3 prior cardiac catheterization reviewed  S/P radiation therapy  previous Hodgkin's lymphoma, radiation likely contributing to underlying coronary disease   Hodgkin lymphoma, unspecified Hodgkin lymphoma type, unspecified body region [facility])  remote history in 1994   Congestive dilated cardiomyopathy ([place]) no significant improvement in ejection fraction following revascularization .  EF was moderately depressed prior to bypass surgery  We have recommended repeat echocardiogram to evaluate ejection fraction on medical therapy. Wife prefers to have this done in 6 months time. Recommended we meet again after echocardiogram to  discuss results to determine if he might be a candidate for ICD  Chronic systolic CHF (congestive heart failure) ([place]) Appears relatively euvolemic if shortness of breath gets worse, will try Lasix daily   Total encounter time more than 25 minutes  Greater than 50% was spent in counseling and coordination of care with the patient  Disposition:   F/U  6 month   No orders of the defined types were placed in this encounter.    Signed, Esmond Plants, M.D., Ph.D. 05/01/2016  [place], [place]

## 2016-05-01 ENCOUNTER — Encounter: Payer: Self-pay | Admitting: Cardiovascular Disease

## 2016-05-01 ENCOUNTER — Ambulatory Visit (INDEPENDENT_AMBULATORY_CARE_PROVIDER_SITE_OTHER): Payer: Managed Care, Other (non HMO) | Admitting: Cardiovascular Disease

## 2016-05-01 VITALS — BP 110/64 | HR 57 | Ht 68.0 in | Wt 220.0 lb

## 2016-05-01 DIAGNOSIS — R0602 Shortness of breath: Secondary | ICD-10-CM

## 2016-05-01 DIAGNOSIS — Z951 Presence of aortocoronary bypass graft: Secondary | ICD-10-CM | POA: Diagnosis not present

## 2016-05-01 DIAGNOSIS — I2510 Atherosclerotic heart disease of native coronary artery without angina pectoris: Secondary | ICD-10-CM | POA: Diagnosis not present

## 2016-05-01 DIAGNOSIS — I255 Ischemic cardiomyopathy: Secondary | ICD-10-CM | POA: Diagnosis not present

## 2016-05-01 DIAGNOSIS — E7800 Pure hypercholesterolemia, unspecified: Secondary | ICD-10-CM | POA: Diagnosis not present

## 2016-05-01 DIAGNOSIS — I200 Unstable angina: Secondary | ICD-10-CM | POA: Diagnosis not present

## 2016-05-01 NOTE — Patient Instructions (Addendum)
Medication Instructions:   No medication changes made  Labwork:  No new labs needed  Testing/Procedures:  We will order an echocardiogram for 6 months, follow up after echo CAD, CABG, mitral valve regurgitation Echocardiography is a painless test that uses sound waves to create images of your heart. It provides your doctor with information about the size and shape of your heart and how well your heart's chambers and valves are working. This procedure takes approximately one hour. There are no restrictions for this procedure.    Follow-Up: It was a pleasure seeing you in the office today. Please call us if you have new issues that need to be addressed before your next appt.  [phone]  Your physician wants you to follow-up in: 6 months.  You will receive a reminder letter in the mail two months in advance. If you don't receive a letter, please call our office to schedule the follow-up appointment.  If you need a refill on your cardiac medications before your next appointment, please call your pharmacy. Echocardiogram An echocardiogram, or echocardiography, uses sound waves (ultrasound) to produce an image of your heart. The echocardiogram is simple, painless, obtained within a short period of time, and offers valuable information to your health care provider. The images from an echocardiogram can provide information such as:  Evidence of coronary artery disease (CAD).  Heart size.  Heart muscle function.  Heart valve function.  Aneurysm detection.  Evidence of a past heart attack.  Fluid buildup around the heart.  Heart muscle thickening.  Assess heart valve function. Tell a health care provider about:  Any allergies you have.  All medicines you are taking, including vitamins, herbs, eye drops, creams, and over-the-counter medicines.  Any problems you or family members have had with anesthetic medicines.  Any blood disorders you have.  Any surgeries you have  had.  Any medical conditions you have.  Whether you are pregnant or may be pregnant. What happens before the procedure? No special preparation is needed. Eat and drink normally. What happens during the procedure?  In order to produce an image of your heart, gel will be applied to your chest and a wand-like tool (transducer) will be moved over your chest. The gel will help transmit the sound waves from the transducer. The sound waves will harmlessly bounce off your heart to allow the heart images to be captured in real-time motion. These images will then be recorded.  You may need an IV to receive a medicine that improves the quality of the pictures. What happens after the procedure? You may return to your normal schedule including diet, activities, and medicines, unless your health care provider tells you otherwise. This information is not intended to replace advice given to you by your health care provider. Make sure you discuss any questions you have with your health care provider. Document Released: 12/21/1999 Document Revised: 08/11/2015 Document Reviewed: 08/30/2012 Elsevier Interactive Patient Education  2017 [employer].

## 2016-06-02 ENCOUNTER — Encounter: Payer: Self-pay | Admitting: Cardiovascular Disease

## 2016-07-11 ENCOUNTER — Encounter: Payer: Self-pay | Admitting: Cardiovascular Disease

## 2016-07-23 ENCOUNTER — Other Ambulatory Visit: Payer: Self-pay | Admitting: Cardiovascular Disease

## 2016-07-23 DIAGNOSIS — I2510 Atherosclerotic heart disease of native coronary artery without angina pectoris: Secondary | ICD-10-CM

## 2016-07-23 DIAGNOSIS — I255 Ischemic cardiomyopathy: Secondary | ICD-10-CM

## 2016-07-23 DIAGNOSIS — I200 Unstable angina: Secondary | ICD-10-CM

## 2016-07-23 DIAGNOSIS — R0602 Shortness of breath: Secondary | ICD-10-CM

## 2016-07-28 ENCOUNTER — Other Ambulatory Visit: Payer: Self-pay

## 2016-08-06 ENCOUNTER — Other Ambulatory Visit: Payer: Self-pay

## 2016-08-06 ENCOUNTER — Ambulatory Visit (INDEPENDENT_AMBULATORY_CARE_PROVIDER_SITE_OTHER): Payer: Managed Care, Other (non HMO)

## 2016-08-06 DIAGNOSIS — R0602 Shortness of breath: Secondary | ICD-10-CM | POA: Diagnosis not present

## 2016-08-06 DIAGNOSIS — I2510 Atherosclerotic heart disease of native coronary artery without angina pectoris: Secondary | ICD-10-CM

## 2016-08-06 DIAGNOSIS — I255 Ischemic cardiomyopathy: Secondary | ICD-10-CM | POA: Diagnosis not present

## 2016-08-06 DIAGNOSIS — I200 Unstable angina: Secondary | ICD-10-CM | POA: Diagnosis not present

## 2016-08-18 ENCOUNTER — Other Ambulatory Visit: Payer: Self-pay | Admitting: Cardiovascular Disease

## 2016-10-16 ENCOUNTER — Other Ambulatory Visit: Payer: Self-pay | Admitting: Cardiovascular Disease

## 2016-10-16 ENCOUNTER — Other Ambulatory Visit: Payer: Self-pay

## 2016-10-16 MED ORDER — ROSUVASTATIN CALCIUM 40 MG PO TABS: 40.0000 mg | ORAL_TABLET | Freq: Every day | ORAL | 1 refills | Status: DC

## 2016-10-16 MED ORDER — VITAMIN B-12 1000 MCG PO TABS: 1000.0000 ug | ORAL_TABLET | Freq: Every day | ORAL | 1 refills | Status: AC

## 2016-10-16 MED ORDER — SACUBITRIL-VALSARTAN 49-51 MG PO TABS: 1.0000 | ORAL_TABLET | Freq: Two times a day (BID) | ORAL | 0 refills | Status: DC

## 2016-10-16 MED ORDER — EZETIMIBE 10 MG PO TABS: 10.0000 mg | ORAL_TABLET | Freq: Every day | ORAL | 1 refills | Status: DC

## 2016-11-21 ENCOUNTER — Ambulatory Visit: Payer: 59 | Admitting: Cardiovascular Disease

## 2016-11-21 ENCOUNTER — Encounter: Payer: Self-pay | Admitting: Cardiovascular Disease

## 2016-11-21 VITALS — BP 122/70 | HR 75 | Ht 68.0 in | Wt 219.2 lb

## 2016-11-21 DIAGNOSIS — R0602 Shortness of breath: Secondary | ICD-10-CM

## 2016-11-21 DIAGNOSIS — Z23 Encounter for immunization: Secondary | ICD-10-CM | POA: Diagnosis not present

## 2016-11-21 DIAGNOSIS — I200 Unstable angina: Secondary | ICD-10-CM

## 2016-11-21 DIAGNOSIS — I255 Ischemic cardiomyopathy: Secondary | ICD-10-CM | POA: Diagnosis not present

## 2016-11-21 DIAGNOSIS — I2510 Atherosclerotic heart disease of native coronary artery without angina pectoris: Secondary | ICD-10-CM | POA: Diagnosis not present

## 2016-11-21 DIAGNOSIS — Z951 Presence of aortocoronary bypass graft: Secondary | ICD-10-CM | POA: Diagnosis not present

## 2016-11-21 DIAGNOSIS — E7800 Pure hypercholesterolemia, unspecified: Secondary | ICD-10-CM | POA: Diagnosis not present

## 2016-11-21 MED ORDER — FUROSEMIDE 40 MG PO TABS: 40.0000 mg | ORAL_TABLET | Freq: Every day | ORAL | 6 refills | Status: DC | PRN

## 2016-11-21 MED ORDER — POTASSIUM CHLORIDE CRYS ER 10 MEQ PO TBCR: 10.0000 meq | EXTENDED_RELEASE_TABLET | Freq: Every day | ORAL | 6 refills | Status: DC | PRN

## 2016-11-21 MED ORDER — POTASSIUM CHLORIDE CRYS ER 20 MEQ PO TBCR: 20.0000 meq | EXTENDED_RELEASE_TABLET | Freq: Every day | ORAL | 6 refills | Status: DC | PRN

## 2016-11-21 MED ORDER — SPIRONOLACTONE 25 MG PO TABS: 25.0000 mg | ORAL_TABLET | Freq: Every day | ORAL | 3 refills | Status: DC

## 2016-11-21 NOTE — Progress Notes (Signed)
Cardiology Office Note  Date:  11/21/2016   ID:  Willie [name], DOB 1965-08-23, MRN [id number]  PCP:  Tracie Harrier, MD   Chief Complaint  Patient presents with  . other    6 month follow up. Meds reviewed by the pt. verbally. "doing well."    HPI:   51 year old gentleman with history of  Hodgkin's lymphoma with treatment in 1994, 1995 including chemotherapy and radiation ,  smoking history for 20 years,  coronary artery disease, bypass surgery 3 with  LIMA,left radial, RIMA  October 2013, cardiomyopathy with ejection fraction 30% noted prior to bypass surgery  33% by stress test August 2017, no ischemia  hyperlipidemia  chronic systolic CHF, on entresto Left bundle branch block noted prior to bypass surgery Mild bilateral carotid disease in 2013 who presents  for management of his ischemia cardiomyopathy.  SOB with flat or stairs, Otherwise reports he is stable Denies any PND orthopnea, no leg edema  On his previous office visit we had recommended he meet with Dr. Caryl Comes, EP for discussion of ICD.  It does not appear that this appointment was made  Echocardiogram August 2018 with severely depressed ejection fraction 20-25%  Reports he has been compliant with his medications including Entresto, carvedilol Works full-time  Lab work done through primary care,. HANDE Total cholesterol 100, LDL 40, TSH 1.8, normal renal function  EKG personally reviewed by myself on todays visit Shows sinus bradycardia 75 bpm left bundle branch block  Other records reviewed with him Stress test: 09/05/15 Exercise myocardial perfusion imaging study with no significant  ischemia Fixed defect mid to apical anterior wall, apical septal region and apical region Anteroseptal and septal wall hypokinesis, EF estimated at 33%  Abnormal wall motion possibly secondary to left bundle branch block  Prior to the bypass surgery his legs felt heavy, had significant fatigue He had abnormal EKG  leading to echocardiogram and stress test which led to catheterization and then bypass surgery  Outside Echo 2016: EF 30 Severe MR?, moderate TR, RVSP 47+ RA  Outside echo, Ejection fraction 25%  On 03/14/15 Moderate MR  History of Hodgkin's lymphoma, hypertension, left bundle branch block Quit smoking 21 yrs back ( 1/2 ppd x 5-6 yrs)  Catheterization report reviewed showing 85% left main disease, 60% RCA disease, 50% circumflex disease   PMH:   has a past medical history of Coronary artery disease, Family history of coronary artery disease (10/23/2011), Hodgkin's lymphoma ([place]) (1994), Hyperlipidemia (10/23/2011), Hypertension, Left main coronary artery disease (10/22/2011), S/P CABG x 3 (10/24/2011), S/P radiation therapy (10/21/1992), and Unstable angina ([place]) (10/22/2011).  PSH:    Past Surgical History:  Procedure Laterality Date  . CARDIAC CATHETERIZATION    . CARPAL TUNNEL RELEASE    . CORONARY ARTERY BYPASS GRAFTING (CABG) N/A 10/24/2011   Performed by Rexene Alberts, MD at [place]   left neck  . RADIAL ARTERY HARVEST Left 10/24/2011   Performed by Rexene Alberts, MD at [facility] OR    Current Outpatient Medications  Medication Sig Dispense Refill  . allopurinol (ZYLOPRIM) 300 MG tablet Take 300 mg by mouth daily.    Marland Kitchen aspirin EC 81 MG tablet Take 81 mg by mouth daily.    . carvedilol (COREG) 6.25 MG tablet TAKE ONE TABLET BY MOUTH TWICE DAILY 180 tablet 3  . ezetimibe (ZETIA) 10 MG tablet Take 1 tablet (10 mg total) by mouth daily. 30 tablet 1  . rosuvastatin (CRESTOR)  40 MG tablet Take 1 tablet (40 mg total) by mouth daily. 30 tablet 1  . sacubitril-valsartan (ENTRESTO) 49-51 MG Take 1 tablet by mouth 2 (two) times daily. 120 tablet 0  . sennosides-docusate sodium (SENOKOT-S) 8.6-50 MG tablet Take 4 tablets by mouth daily.    . vitamin B-12 (CYANOCOBALAMIN) 1000 MCG tablet Take 1 tablet (1,000 mcg total) by mouth daily. 30 tablet 1   No current  facility-administered medications for this visit.      Allergies:   Patient has no known allergies.   Social History:  The patient  reports that he quit smoking about 20 years ago. He smoked 0.50 packs per day. His smokeless tobacco use includes snuff. He reports that he does not drink alcohol or use drugs.   Family History:   family history includes Coronary artery disease in his unknown relative; Heart attack in his father; Heart disease in his mother.    Review of Systems: Review of Systems  Constitutional: Negative.   Respiratory: Positive for shortness of breath.   Cardiovascular: Negative.   Gastrointestinal: Negative.   Musculoskeletal: Negative.   Psychiatric/Behavioral: Negative.   All other systems reviewed and are negative.    PHYSICAL EXAM: VS:  BP 122/70 (BP Location: Left Arm, Patient Position: Sitting, Cuff Size: Normal)   Pulse 75   Ht 5\' 8"  (1.727 m)   Wt 219 lb 4 oz (99.5 kg)   BMI 33.34 kg/m  , BMI Body mass index is 33.34 kg/m. GEN: Well nourished, well developed, in no acute distress  HEENT: normal  Neck: no JVD, carotid bruits, or masses Cardiac: RRR; no murmurs, rubs, or gallops,no edema  Respiratory:  clear to auscultation bilaterally, normal work of breathing GI: soft, nontender, nondistended, + BS MS: no deformity or atrophy  Skin: warm and dry, no rash Neuro:  Strength and sensation are intact Psych: euthymic mood, full affect    Recent Labs: No results found for requested labs within last 8760 hours.    Lipid Panel Lab Results  Component Value Date   CHOL 245 (H) 10/23/2011   HDL 26 (L) 10/23/2011   LDLCALC 166 (H) 10/23/2011   TRIG 265 (H) 10/23/2011      Wt Readings from Last 3 Encounters:  11/21/16 219 lb 4 oz (99.5 kg)  05/01/16 220 lb (99.8 kg)  10/10/15 216 lb 12 oz (98.3 kg)       ASSESSMENT AND PLAN:   Left main coronary artery disease - Denies any symptoms concerning for angina No further testing at this  time Stress test in 2017 with no ischemia  Stable angina Reports that he feels well, shortness of breath resolved  Shortness of breath Underlying cardiomyopathy, weight gain, deconditioning Reports he is doing better with regular exertion, deconditioning We'll increase the entresto up to 49/51 mg twice a day  S/P CABG x 3 Stress test last year with no ischemia  S/P radiation therapy  previous Hodgkin's lymphoma, radiation likely contributing to underlying coronary disease   Hodgkin lymphoma, unspecified Hodgkin lymphoma type, unspecified body region [facility])  remote history in 1994   Congestive dilated cardiomyopathy ([place]) Ischemic cardia myopathy ejection fraction 20-25% We will refer him to Dr. Caryl Comes to see if he is a candidate for ICD  Chronic systolic CHF (congestive heart failure) ([place]) Recommended he take Lasix as needed for shortness of breath, abdominal bloating Added Aldactone 25 mg daily   Total encounter time more than 25 minutes  Greater than 50% was spent in counseling  and coordination of care with the patient  Disposition:   F/U  6 month   Total encounter time more than 25 minutes  Greater than 50% was spent in counseling and coordination of care with the patient    Orders Placed This Encounter  Procedures  . EKG 12-Lead     Signed, Esmond Plants, M.D., Ph.D. 11/21/2016  [place], [place]

## 2016-11-21 NOTE — Patient Instructions (Signed)
We will schedule an appt with Dr. Caryl Comes to discuss benefits of ICD  Medication Instructions:   Please start spironolactone one a day  Take lasix/furosemide as needed with potassium for bloating/shortness ofbreath  Labwork:  No new labs needed  Testing/Procedures:  No further testing at this time   Follow-Up: It was a pleasure seeing you in the office today. Please call us if you have new issues that need to be addressed before your next appt.  [phone]  Your physician wants you to follow-up in: 6 months.  You will receive a reminder letter in the mail two months in advance. If you don't receive a letter, please call our office to schedule the follow-up appointment.  If you need a refill on your cardiac medications before your next appointment, please call your pharmacy.

## 2016-11-24 ENCOUNTER — Encounter: Payer: Self-pay | Admitting: Cardiovascular Disease

## 2016-12-04 ENCOUNTER — Ambulatory Visit: Payer: 59 | Admitting: Internal Medicine

## 2016-12-04 ENCOUNTER — Encounter: Payer: Self-pay | Admitting: Internal Medicine

## 2016-12-04 VITALS — BP 120/76 | HR 69 | Ht 68.0 in | Wt 217.5 lb

## 2016-12-04 DIAGNOSIS — I509 Heart failure, unspecified: Secondary | ICD-10-CM

## 2016-12-04 DIAGNOSIS — I255 Ischemic cardiomyopathy: Secondary | ICD-10-CM | POA: Diagnosis not present

## 2016-12-04 NOTE — Patient Instructions (Addendum)
Medication Instructions: Your physician has recommended you make the following change in your medication:  -1) RESUME Aldactone (spironolactone) 25 mg -  For the first week take 0.5 tablet (12.5 mg) by mouth daily, then increase to 1 tablet (25 mg) by mouth daily  Labwork: Your physician recommends that you return for lab work in: [place]. Please report to the [place] at [facility] for labs. You do not need an appointment  Procedures/Testing: Your physician has requested that you have a cardiac MRI - In 2 months. Cardiac MRI uses a computer to create images of your heart as its beating, producing both still and moving pictures of your heart and major blood vessels. For further information please visit [email or web address] Please follow the instruction sheet given to you today for more information.  Follow-Up: Your physician recommends that you schedule a follow-up appointment in: [place] with Dr. Caryl Comes.   If you need a refill on your cardiac medications before your next appointment, please call your pharmacy.

## 2016-12-04 NOTE — Progress Notes (Signed)
ELECTROPHYSIOLOGY CONSULT NOTE  Patient ID: Willie [name], MRN: [id number], DOB/AGE: 07-23-1965 51 y.o. Admit date: (Not on file) Date of Consult: 12/04/2016  Primary Physician: Tracie Harrier, MD Primary Cardiologist: Claybon Jabs is a 51 y.o. male who is being seen today for the evaluation of ICD at the request of TG.    HPI Willie [name] is a 51 y.o. male  Referred for consideration of an ICD  He has CAD and is s/p CABG 2013.  Post operatively developed LBBB.  He has had progressive left ventricular dysfunction and is managed with a combination of Entresto carvedilol  He was having complaints of exercise associated shortness of breath; the introduction of a diuretic has ameliorated this.  He denies edema.  No exertional chest discomfort.  No palpitations or syncope.  DATE TEST    10/13 Echo    EF 30-35 %   3/17 Echo  EF 25 %  Moderate MR  8/17 Myoview EF33% Fixed defects without ischemia  8/18 Echo   EF 20-25 % MR mild        Date Cr K Hgb LDL  8/18 1.2 4.5 15 40             Past Medical History:  Diagnosis Date  . Coronary artery disease   . Family history of coronary artery disease 10/23/2011  . Hodgkin's lymphoma ([place]) 1994   neck/midchest area with radiation  . Hyperlipidemia 10/23/2011  . Hypertension   . Left main coronary artery disease 10/22/2011  . S/P CABG x 3 10/24/2011   LIMA to LAD, RIMA to RCA, LRA to OM2  . S/P radiation therapy 10/21/1992   Radiation therapy to chest and neck for Hodgkin's lymphoma  . Unstable angina ([place]) 10/22/2011      Surgical History:  Past Surgical History:  Procedure Laterality Date  . CARDIAC CATHETERIZATION    . CARPAL TUNNEL RELEASE    . CORONARY ARTERY BYPASS GRAFT  10/24/2011   Procedure: CORONARY ARTERY BYPASS GRAFTING (CABG);  Surgeon: Rexene Alberts, MD;  Location: [facility];  Service: Open Heart Surgery;  Laterality: N/A;  Coronary Artery Bypass Grafting X 3 Using Left Internal  Mammary  Artery, Right Internal Mammary Artery and Right Radial Artery  . LYMPH NODE BIOPSY  1994   left neck  . RADIAL ARTERY HARVEST  10/24/2011   Procedure: RADIAL ARTERY HARVEST;  Surgeon: Rexene Alberts, MD;  Location: [facility];  Service: Vascular;  Laterality: Left;     Home Meds: Prior to Admission medications   Medication Sig Start Date End Date Taking? Authorizing Provider  allopurinol (ZYLOPRIM) 300 MG tablet Take 300 mg by mouth daily.   Yes [provider]  aspirin EC 81 MG tablet Take 81 mg by mouth daily.   Yes [provider]  carvedilol (COREG) 6.25 MG tablet TAKE ONE TABLET BY MOUTH TWICE DAILY 08/18/16  Yes Gollan, Kathlene November, MD  ezetimibe (ZETIA) 10 MG tablet Take 1 tablet (10 mg total) by mouth daily. 10/16/16  Yes Minna Merritts, MD  furosemide (LASIX) 40 MG tablet Take 1 tablet (40 mg total) daily as needed by mouth. 11/21/16  Yes Gollan, Kathlene November, MD  potassium chloride SA (K-DUR,KLOR-CON) 10 MEQ tablet Take 1 tablet (10 mEq total) daily as needed by mouth. 11/21/16  Yes Gollan, Kathlene November, MD  rosuvastatin (CRESTOR) 40 MG tablet Take 1 tablet (40 mg total) by mouth daily. 10/16/16  Yes Gollan, Kathlene November, MD  sacubitril-valsartan (ENTRESTO) 49-51 MG Take 1 tablet by mouth 2 (two) times daily. 10/16/16  Yes Gollan, Kathlene November, MD  sennosides-docusate sodium (SENOKOT-S) 8.6-50 MG tablet Take 4 tablets by mouth daily.   Yes [provider]  vitamin B-12 (CYANOCOBALAMIN) 1000 MCG tablet Take 1 tablet (1,000 mcg total) by mouth daily. 10/16/16  Yes Minna Merritts, MD    Allergies: No Known Allergies  Social History   Socioeconomic History  . Marital status: Married    Spouse name: Not on file  . Number of children: Not on file  . Years of education: Not on file  . Highest education level: Not on file  Social Needs  . Financial resource strain: Not on file  . Food insecurity - worry: Not on file  . Food insecurity - inability: Not on file  .  Transportation needs - medical: Not on file  . Transportation needs - non-medical: Not on file  Occupational History  . Not on file  Tobacco Use  . Smoking status: Former Smoker    Packs/day: 0.50    Last attempt to quit: 10/21/1996    Years since quitting: 20.1  . Smokeless tobacco: Current User    Types: Snuff  Substance and Sexual Activity  . Alcohol use: No  . Drug use: No  . Sexual activity: Yes  Other Topics Concern  . Not on file  Social History Narrative  . Not on file     Family History  Problem Relation Age of Onset  . Coronary artery disease Unknown   . Heart disease Mother   . Heart attack Father      ROS:  Please see the history of present illness.  All other systems reviewed and negative.    Physical Exam:  Blood pressure 120/76, pulse 69, height 5\' 8"  (1.727 m), weight 217 lb 8 oz (98.7 kg). General: Well developed, well nourished male in no acute distress. Head: Normocephalic, atraumatic, sclera non-icteric, no xanthomas, nares are without discharge. EENT: normal  Lymph Nodes:  none Neck: Negative for carotid bruits. JVD 8-10 Back:without scoliosis kyphosis  Lungs: Clear bilaterally to auscultation without wheezes, rales, or rhonchi. Breathing is unlabored. Heart: RRR with S1 soft S2. 2  /6 systolic murmur . No rubs, or gallops appreciated. Abdomen: Soft, non-tender, non-distended with normoactive bowel sounds. No hepatomegaly. No rebound/guarding. No obvious abdominal masses. Msk:  Strength and tone appear normal for age. Extremities: No clubbing or cyanosis. No edema.  Distal pedal pulses are 2+ and equal bilaterally. Skin: Warm and Dry Neuro: Alert and oriented X 3. CN III-XII intact Grossly normal sensory and motor function . Psych:  Responds to questions appropriately with a normal affect.      Labs: Cardiac Enzymes No results for input(s): CKTOTAL, CKMB, TROPONINI in the last 72 hours. CBC Lab Results  Component Value Date   WBC 9.8  06/13/2013   HGB 15.4 06/13/2013   HCT 46.8 06/13/2013   MCV 91 06/13/2013   PLT 211 06/13/2013   PROTIME: No results for input(s): LABPROT, INR in the last 72 hours. Chemistry No results for input(s): NA, K, CL, CO2, BUN, CREATININE, CALCIUM, PROT, BILITOT, ALKPHOS, ALT, AST, GLUCOSE in the last 168 hours.  Invalid input(s): LABALBU Lipids Lab Results  Component Value Date   CHOL 245 (H) 10/23/2011   HDL 26 (L) 10/23/2011   LDLCALC 166 (H) 10/23/2011   TRIG 265 (H) 10/23/2011   BNP No results found for: PROBNP  Thyroid Function Tests: No results for input(s): TSH, T4TOTAL, T3FREE, THYROIDAB in the last 72 hours.  Invalid input(s): FREET3 Miscellaneous No results found for: DDIMER  Radiology/Studies:  No results found.  EKG: Sinus rhythm at 66 20/16/45 Left bundle branch block   Assessment and Plan:  Ischemic cardiomyopathy status post CABG  Radiation associated coronary disease  Left bundle branch block  Congestive heart failure-chronic-systolic-class II  Hyperlipidemia   He is currently managed with carvedilol and Entresto.  Initial efforts to initiate Aldactone were challenging.  We have discussed side effects and the relationship to potassium.  We will resume it.  We will arrange for metabolic profile in 2 weeks.  In the event that there is no significant interval improvement in LV function, he would be appropriately considered for CRT-D.  We have reviewed the data for ICD total mortality risk reduction as well as the benefits of resynchronization in the context of left bundle branch block reviewing data from [facility] CRT  This case required high level of decision making given complexity and nuance and multiple interacting factors  In about 2 months, we will arrange for cardiac MR given the variability in measurements from the noninvasive studies   All questions and concerns were addressed.    Willie [name]

## 2016-12-05 ENCOUNTER — Encounter: Payer: Self-pay | Admitting: Internal Medicine

## 2016-12-11 ENCOUNTER — Encounter: Payer: Self-pay | Admitting: Internal Medicine

## 2016-12-17 ENCOUNTER — Other Ambulatory Visit: Payer: Self-pay | Admitting: Cardiovascular Disease

## 2016-12-18 ENCOUNTER — Other Ambulatory Visit
Admission: RE | Admit: 2016-12-18 | Discharge: 2016-12-18 | Disposition: A | Discharge status: Home / Self Care | Payer: 59 | Source: Ambulatory Visit | Attending: Internal Medicine | Admitting: Internal Medicine

## 2016-12-18 DIAGNOSIS — I255 Ischemic cardiomyopathy: Secondary | ICD-10-CM | POA: Diagnosis not present

## 2016-12-18 DIAGNOSIS — I509 Heart failure, unspecified: Secondary | ICD-10-CM | POA: Insufficient documentation

## 2016-12-18 LAB — BASIC METABOLIC PANEL
Anion gap: 7 (ref 5–15)
BUN: 17 mg/dL (ref 6–20)
CALCIUM: 9.4 mg/dL (ref 8.9–10.3)
CO2: 23 mmol/L (ref 22–32)
CREATININE: 1.3 mg/dL — AB (ref 0.61–1.24)
Chloride: 107 mmol/L (ref 101–111)
Glucose, Bld: 96 mg/dL (ref 65–99)
Potassium: 3.9 mmol/L (ref 3.5–5.1)
SODIUM: 137 mmol/L (ref 135–145)

## 2016-12-19 ENCOUNTER — Encounter: Payer: Self-pay | Admitting: Internal Medicine

## 2016-12-22 ENCOUNTER — Telehealth: Payer: Self-pay | Admitting: Cardiovascular Disease

## 2016-12-22 ENCOUNTER — Encounter: Payer: Self-pay | Admitting: Cardiovascular Disease

## 2016-12-22 NOTE — Telephone Encounter (Signed)
-----   Message from [place], Generic sent at 12/22/2016 5:36 AM EST -----    Was looking at my meds list is not right it is missing the Crestor 40 mg 1x day, also missing from list is Spironoactone (aidactone) 25mg  1x day    Crestor is currently on the patient's medications list. Spironolactone was added.

## 2016-12-26 ENCOUNTER — Ambulatory Visit (HOSPITAL_COMMUNITY): Payer: 59

## 2016-12-26 ENCOUNTER — Encounter: Payer: Self-pay | Admitting: Internal Medicine

## 2017-01-09 ENCOUNTER — Other Ambulatory Visit (HOSPITAL_COMMUNITY): Payer: Self-pay

## 2017-01-19 ENCOUNTER — Encounter: Payer: Self-pay | Admitting: Internal Medicine

## 2017-01-23 ENCOUNTER — Telehealth: Payer: Self-pay | Admitting: Internal Medicine

## 2017-01-23 NOTE — Telephone Encounter (Signed)
Patient wife dropped off mri denial letter from [place] to see.

## 2017-01-30 ENCOUNTER — Encounter: Payer: Self-pay | Admitting: Internal Medicine

## 2017-02-02 ENCOUNTER — Encounter: Payer: Self-pay | Admitting: Internal Medicine

## 2017-02-06 ENCOUNTER — Ambulatory Visit (HOSPITAL_COMMUNITY): Payer: 59

## 2017-02-11 ENCOUNTER — Encounter: Payer: Self-pay | Admitting: Internal Medicine

## 2017-02-13 ENCOUNTER — Telehealth: Payer: Self-pay | Admitting: Cardiovascular Disease

## 2017-02-13 ENCOUNTER — Other Ambulatory Visit: Payer: Self-pay

## 2017-02-13 ENCOUNTER — Ambulatory Visit (HOSPITAL_COMMUNITY)
Admission: RE | Admit: 2017-02-13 | Discharge: 2017-02-13 | Disposition: A | Discharge status: Home / Self Care | Payer: 59 | Source: Ambulatory Visit | Attending: Internal Medicine | Admitting: Internal Medicine

## 2017-02-13 ENCOUNTER — Other Ambulatory Visit: Payer: Self-pay | Admitting: Internal Medicine

## 2017-02-13 DIAGNOSIS — I429 Cardiomyopathy, unspecified: Secondary | ICD-10-CM | POA: Diagnosis not present

## 2017-02-13 DIAGNOSIS — I340 Nonrheumatic mitral (valve) insufficiency: Secondary | ICD-10-CM | POA: Insufficient documentation

## 2017-02-13 DIAGNOSIS — I255 Ischemic cardiomyopathy: Secondary | ICD-10-CM | POA: Diagnosis not present

## 2017-02-13 DIAGNOSIS — I509 Heart failure, unspecified: Secondary | ICD-10-CM | POA: Insufficient documentation

## 2017-02-13 LAB — CREATININE, SERUM
Creatinine, Ser: 1 mg/dL (ref 0.61–1.24)
GFR calc Af Amer: >60 mL/min (ref >60)
GFR calc non Af Amer: >60 mL/min (ref >60)

## 2017-02-13 MED ORDER — ROSUVASTATIN CALCIUM 40 MG PO TABS: 40.0000 mg | ORAL_TABLET | Freq: Every day | ORAL | 0 refills | Status: DC

## 2017-02-13 MED ORDER — EZETIMIBE 10 MG PO TABS: 10.0000 mg | ORAL_TABLET | Freq: Every day | ORAL | 0 refills | Status: DC

## 2017-02-13 MED ORDER — GADOBENATE DIMEGLUMINE 529 MG/ML IV SOLN
33.0000 mL | Freq: Once | INTRAVENOUS | Status: AC | PRN
  Administered 2017-02-13: 33 mL via INTRAVENOUS

## 2017-02-13 MED ORDER — SACUBITRIL-VALSARTAN 49-51 MG PO TABS: 1.0000 | ORAL_TABLET | Freq: Two times a day (BID) | ORAL | 0 refills | Status: DC

## 2017-02-13 NOTE — Telephone Encounter (Signed)
°*  STAT* If patient is at the pharmacy, call can be transferred to refill team.   1. Which medications need to be refilled? (please list name of each medication and dose if known)  Crestor  Entresto  Zetia (generic)    2. Which pharmacy/location (including street and city if local pharmacy) is medication to be sent to? [place] [facility]   3. Do they need a 30 day or 90 day supply? 90 day

## 2017-02-19 ENCOUNTER — Ambulatory Visit: Payer: 59 | Admitting: Internal Medicine

## 2017-02-19 ENCOUNTER — Encounter: Payer: Self-pay | Admitting: Internal Medicine

## 2017-02-19 ENCOUNTER — Ambulatory Visit: Payer: Self-pay | Admitting: Internal Medicine

## 2017-02-19 VITALS — BP 110/70 | HR 64 | Ht 68.0 in | Wt 217.0 lb

## 2017-02-19 DIAGNOSIS — I447 Left bundle-branch block, unspecified: Secondary | ICD-10-CM | POA: Diagnosis not present

## 2017-02-19 DIAGNOSIS — I5022 Chronic systolic (congestive) heart failure: Secondary | ICD-10-CM | POA: Diagnosis not present

## 2017-02-19 DIAGNOSIS — I255 Ischemic cardiomyopathy: Secondary | ICD-10-CM

## 2017-02-19 NOTE — Progress Notes (Signed)
Patient Care Team: Tracie Harrier, MD as PCP - General (Internal Medicine) Minna Merritts, MD as Consulting Physician (Cardiology)   HPI  Willie [name] is a 52 y.o. male Seen for reconsideration of an ICD.  His ischemic heart disease with prior bypass surgery 2013.  He was thought to have Hodgkin's disease associated radiation aggravated coronary disease.  He had postoperative left bundle branch block.     DATE TEST    10/13 Echo    EF 30-35 %   3/17 Echo  EF 25 %  Moderate MR  8/17 Myoview EF33% Fixed defects without ischemia  8/18 Echo   EF 20-25 % MR mild  2/19 cMRI EF 43%    Date Cr K Hgb LDL  8/18 1.2 4.5 15 40   2/19 1.2 4.4 14.6 44   No complaints of sob or chest pain  Records and Results Reviewed As above   Past Medical History:  Diagnosis Date  . Coronary artery disease   . Family history of coronary artery disease 10/23/2011  . Hodgkin's lymphoma ([place]) 1994   neck/midchest area with radiation  . Hyperlipidemia 10/23/2011  . Hypertension   . Left main coronary artery disease 10/22/2011  . S/P CABG x 3 10/24/2011   LIMA to LAD, RIMA to RCA, LRA to OM2  . S/P radiation therapy 10/21/1992   Radiation therapy to chest and neck for Hodgkin's lymphoma  . Unstable angina ([place]) 10/22/2011    Past Surgical History:  Procedure Laterality Date  . CARDIAC CATHETERIZATION    . CARPAL TUNNEL RELEASE    . CORONARY ARTERY BYPASS GRAFT  10/24/2011   Procedure: CORONARY ARTERY BYPASS GRAFTING (CABG);  Surgeon: Rexene Alberts, MD;  Location: [facility];  Service: Open Heart Surgery;  Laterality: N/A;  Coronary Artery Bypass Grafting X 3 Using Left Internal  Mammary Artery, Right Internal Mammary Artery and Right Radial Artery  . LYMPH NODE BIOPSY  1994   left neck  . RADIAL ARTERY HARVEST  10/24/2011   Procedure: RADIAL ARTERY HARVEST;  Surgeon: Rexene Alberts, MD;  [facility];  Service: Vascular;  Laterality: Left;    Current Outpatient  Medications  Medication Sig Dispense Refill  . allopurinol (ZYLOPRIM) 300 MG tablet Take 300 mg by mouth daily.    Marland Kitchen aspirin EC 81 MG tablet Take 81 mg by mouth daily.    . carvedilol (COREG) 6.25 MG tablet TAKE ONE TABLET BY MOUTH TWICE DAILY 180 tablet 3  . ezetimibe (ZETIA) 10 MG tablet Take 1 tablet (10 mg total) by mouth daily. 90 tablet 0  . furosemide (LASIX) 40 MG tablet Take 1 tablet (40 mg total) daily as needed by mouth. 30 tablet 6  . potassium chloride SA (K-DUR,KLOR-CON) 10 MEQ tablet Take 1 tablet (10 mEq total) daily as needed by mouth. 30 tablet 6  . rosuvastatin (CRESTOR) 40 MG tablet Take 1 tablet (40 mg total) by mouth daily. 90 tablet 0  . sacubitril-valsartan (ENTRESTO) 49-51 MG Take 1 tablet by mouth 2 (two) times daily. 180 tablet 0  . sennosides-docusate sodium (SENOKOT-S) 8.6-50 MG tablet Take 4 tablets by mouth daily.    Marland Kitchen spironolactone (ALDACTONE) 25 MG tablet Take 25 mg by mouth daily.    . vitamin B-12 (CYANOCOBALAMIN) 1000 MCG tablet Take 1 tablet (1,000 mcg total) by mouth daily. 30 tablet 1   No current facility-administered medications for this visit.     No Known Allergies  Review of Systems negative except from HPI and PMH  Physical Exam BP 110/70 (BP Location: Left Arm, Patient Position: Sitting, Cuff Size: Normal)   Pulse 64   Ht 5\' 8"  (1.727 m)   Wt 217 lb (98.4 kg)   BMI 32.99 kg/m  Well developed and nourished in no acute distress HENT normal Neck supple with JVP-flat Clear Regular rate and rhythm, no murmurs or gallops Abd-soft with active BS No Clubbing cyanosis edema Skin-warm and dry A & Oriented  Grossly normal sensory and motor function    Assessment and  Plan  Ischemic cardiomyopathy status post CABG  Radiation associated coronary disease  Left bundle branch block  Congestive heart failure-chronic-systolic-class II  Hyperlipidemia  His LVEF has improved significantly.  There is no current indication for  device implantation.  We will continue him on his current medications.  He will follow-up with Dr. Deidre Ala.  We will see him as needed.    Current medicines are reviewed at length with the patient today .  The patient does not  have concerns regarding medicines.

## 2017-02-19 NOTE — Patient Instructions (Signed)
Medication Instructions: - Your physician recommends that you continue on your current medications as directed. Please refer to the Current Medication list given to you today.  Labwork: - none ordered  Procedures/Testing: - none ordered  Follow-Up: - Your physician recommends that you schedule a follow-up appointment in: May 2019 with Dr. Rockey Situ (per recall).  - Dr. Caryl Comes will see you back on an as needed basis.   Any Additional Special Instructions Will Be Listed Below (If Applicable).     If you need a refill on your cardiac medications before your next appointment, please call your pharmacy.

## 2017-05-15 ENCOUNTER — Telehealth: Payer: Self-pay | Admitting: Cardiovascular Disease

## 2017-05-15 ENCOUNTER — Other Ambulatory Visit: Payer: Self-pay

## 2017-05-15 ENCOUNTER — Other Ambulatory Visit: Payer: Self-pay | Admitting: Cardiovascular Disease

## 2017-05-15 MED ORDER — EZETIMIBE 10 MG PO TABS: 10.0000 mg | ORAL_TABLET | Freq: Every day | ORAL | 0 refills | Status: DC

## 2017-05-15 MED ORDER — CARVEDILOL 6.25 MG PO TABS: 6.2500 mg | ORAL_TABLET | Freq: Two times a day (BID) | ORAL | 0 refills | Status: DC

## 2017-05-15 MED ORDER — ROSUVASTATIN CALCIUM 40 MG PO TABS: 40.0000 mg | ORAL_TABLET | Freq: Every day | ORAL | 0 refills | Status: DC

## 2017-05-15 MED ORDER — FUROSEMIDE 40 MG PO TABS: 40.0000 mg | ORAL_TABLET | Freq: Every day | ORAL | 0 refills | Status: DC | PRN

## 2017-05-15 MED ORDER — SACUBITRIL-VALSARTAN 49-51 MG PO TABS: 1.0000 | ORAL_TABLET | Freq: Two times a day (BID) | ORAL | 0 refills | Status: DC

## 2017-05-15 NOTE — Telephone Encounter (Signed)
Requested Prescriptions   Signed Prescriptions Disp Refills  . sacubitril-valsartan (ENTRESTO) 49-51 MG 60 tablet 0    Sig: Take 1 tablet by mouth 2 (two) times daily.    Authorizing Provider: Minna Merritts    Ordering User: Eugenio Hoes, Hettie Roselli C  . carvedilol (COREG) 6.25 MG tablet 180 tablet 0    Sig: Take 1 tablet (6.25 mg total) by mouth 2 (two) times daily.    Authorizing Provider: Minna Merritts    Ordering User: Eugenio Hoes, Jiovanni Heeter C  . ezetimibe (ZETIA) 10 MG tablet 30 tablet 0    Sig: Take 1 tablet (10 mg total) by mouth daily.    Authorizing Provider: Minna Merritts    Ordering User: Eugenio Hoes, Riggs Dineen C  . furosemide (LASIX) 40 MG tablet 30 tablet 0    Sig: Take 1 tablet (40 mg total) by mouth daily as needed.    Authorizing Provider: Minna Merritts    Ordering User: Britt Bottom

## 2017-05-15 NOTE — Telephone Encounter (Signed)
Refilled until patient can come in to see provider.

## 2017-05-15 NOTE — Telephone Encounter (Signed)
Requested Prescriptions   Signed Prescriptions Disp Refills  . sacubitril-valsartan (ENTRESTO) 49-51 MG 60 tablet 0    Sig: Take 1 tablet by mouth 2 (two) times daily.    Authorizing Provider: Minna Merritts    Ordering User: Eugenio Hoes, MARINA C  . carvedilol (COREG) 6.25 MG tablet 180 tablet 0    Sig: Take 1 tablet (6.25 mg total) by mouth 2 (two) times daily.    Authorizing Provider: Minna Merritts    Ordering User: Eugenio Hoes, MARINA C  . ezetimibe (ZETIA) 10 MG tablet 30 tablet 0    Sig: Take 1 tablet (10 mg total) by mouth daily.    Authorizing Provider: Minna Merritts    Ordering User: Eugenio Hoes, MARINA C  . furosemide (LASIX) 40 MG tablet 30 tablet 0    Sig: Take 1 tablet (40 mg total) by mouth daily as needed.    Authorizing Provider: Minna Merritts    Ordering User: Britt Bottom

## 2017-05-15 NOTE — Telephone Encounter (Signed)
°*  STAT* If patient is at the pharmacy, call can be transferred to refill team.   1. Which medications need to be refilled? (please list name of each medication and dose if known)     entresto  49-51 mg po BID     zetia 10 mg po q d     crestor 40 mg po q d   2. Which pharmacy/location (including street and city if local pharmacy) is medication to be sent to? [facility]   3. Do they need a 30 day or 90 day supply? [place]

## 2017-05-20 ENCOUNTER — Encounter: Payer: Self-pay | Admitting: Cardiovascular Disease

## 2017-05-20 ENCOUNTER — Telehealth: Payer: Self-pay | Admitting: Cardiovascular Disease

## 2017-05-20 ENCOUNTER — Other Ambulatory Visit: Payer: Self-pay | Admitting: Cardiovascular Disease

## 2017-05-20 NOTE — Telephone Encounter (Signed)
Please review for refills- His entresto was already sent in and we are waiting on a prior auth approval.  Thanks!

## 2017-05-20 NOTE — Progress Notes (Signed)
Cardiology Office Note  Date:  05/21/2017   ID:  Willie [name], DOB 1965/02/21, MRN [id number]  PCP:  Tracie Harrier, MD   Chief Complaint  Patient presents with  . OTHER    6 month f/u discuss cardia MRI 2/19. Pt requesting Samples Entresto pt has been having trouble with getting medications. Meds reviewed verbally with pt.    HPI:   52 year old gentleman with history of  Hodgkin's lymphoma with treatment in 1994, 1995 including chemotherapy and radiation , smoking history for 20 years,  coronary artery disease, bypass surgery 3 with  LIMA,left radial, RIMA  October 2013, cardiomyopathy with ejection fraction 30% noted prior to bypass surgery  33% by stress test August 2017, no ischemia  hyperlipidemia  chronic systolic CHF, on entresto Left bundle branch block noted prior to bypass surgery Mild bilateral carotid disease in 2013 who presents  for management of his ischemia cardiomyopathy.  Cardiac MRI 1. Normal LV size with septal-lateral dyssynchrony and mild diffuse hypokinesis, EF 43%. 2.  Normal RV size and systolic function. 3.  Moderate mitral regurgitation. 4.  Minimal coronary pattern LGE.  Long discussion concerning MRI results above He is on carvedilol entresto Aldactone and Lasix every other day Denies significant shortness of breath, no leg edema, no PND orthopnea Works full-time  He has seen Dr. Caryl Comes, no indication for ICD based on MRI results  Echocardiogram August 2018 with severely depressed ejection fraction 20-25%  Total cholesterol 100, LDL 40, TSH 1.8, normal renal function  EKG personally reviewed by myself on todays visit Shows sinus bradycardia 61 bpm left bundle branch block  Other records reviewed with him Stress test: 09/05/15 Exercise myocardial perfusion imaging study with no significant  ischemia Fixed defect mid to apical anterior wall, apical septal region and apical region Anteroseptal and septal wall hypokinesis, EF estimated at  33%  Abnormal wall motion possibly secondary to left bundle branch block  Prior to the bypass surgery his legs felt heavy, had significant fatigue He had abnormal EKG leading to echocardiogram and stress test which led to catheterization and then bypass surgery  Outside Echo 2016: EF 30 Severe MR?, moderate TR, RVSP 47+ RA  Outside echo, Ejection fraction 25%  On 03/14/15 Moderate MR  History of Hodgkin's lymphoma, hypertension, left bundle branch block Quit smoking 21 yrs back ( 1/2 ppd x 5-6 yrs)  Catheterization report reviewed showing 85% left main disease, 60% RCA disease, 50% circumflex disease   PMH:   has a past medical history of Coronary artery disease, Family history of coronary artery disease (10/23/2011), Hodgkin's lymphoma ([place]) (1994), Hyperlipidemia (10/23/2011), Hypertension, Left main coronary artery disease (10/22/2011), 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  PSH:    Past Surgical History:  Procedure Laterality Date  . CARDIAC CATHETERIZATION    . CARPAL TUNNEL RELEASE    . CORONARY ARTERY BYPASS GRAFT  10/24/2011   Procedure: CORONARY ARTERY BYPASS GRAFTING (CABG);  Surgeon: Rexene Alberts, MD;  Location: [place];  Service: Open Heart Surgery;  Laterality: N/A;  Coronary Artery Bypass Grafting X 3 Using Left Internal  Mammary Artery, Right Internal Mammary Artery and Right Radial Artery  . LYMPH NODE BIOPSY  1994   left neck  . RADIAL ARTERY HARVEST  10/24/2011   Procedure: RADIAL ARTERY HARVEST;  Surgeon: Rexene Alberts, MD;  Location: [facility];  Service: Vascular;  Laterality: Left;    Current Outpatient Medications  Medication Sig Dispense Refill  .  allopurinol (ZYLOPRIM) 300 MG tablet Take 300 mg by mouth daily.    Marland Kitchen aspirin EC 81 MG tablet Take 81 mg by mouth daily.    . carvedilol (COREG) 6.25 MG tablet Take 1 tablet (6.25 mg total) by mouth 2 (two) times daily. 180 tablet 0  . ezetimibe  (ZETIA) 10 MG tablet Take 1 tablet (10 mg total) by mouth daily. 30 tablet 0  . furosemide (LASIX) 40 MG tablet Take 1 tablet (40 mg total) by mouth daily as needed. 30 tablet 0  . potassium chloride SA (K-DUR,KLOR-CON) 10 MEQ tablet Take 1 tablet (10 mEq total) daily as needed by mouth. 30 tablet 6  . rosuvastatin (CRESTOR) 40 MG tablet Take 1 tablet (40 mg total) by mouth daily. 30 tablet 0  . sacubitril-valsartan (ENTRESTO) 49-51 MG Take 1 tablet by mouth 2 (two) times daily. 60 tablet 0  . sennosides-docusate sodium (SENOKOT-S) 8.6-50 MG tablet Take 4 tablets by mouth daily.    Marland Kitchen spironolactone (ALDACTONE) 25 MG tablet Take 25 mg by mouth daily.    . vitamin B-12 (CYANOCOBALAMIN) 1000 MCG tablet Take 1 tablet (1,000 mcg total) by mouth daily. 30 tablet 1   No current facility-administered medications for this visit.      Allergies:   Patient has no known allergies.   Social History:  The patient  reports that he quit smoking about 20 years ago. He smoked 0.50 packs per day. His smokeless tobacco use includes snuff. He reports that he does not drink alcohol or use drugs.   Family History:   family history includes Coronary artery disease in his unknown relative; Heart attack in his father; Heart disease in his mother.    Review of Systems: Review of Systems  Constitutional: Negative.   Respiratory: Negative.   Cardiovascular: Negative.   Gastrointestinal: Negative.   Musculoskeletal: Negative.   Neurological: Negative.   Psychiatric/Behavioral: Negative.   All other systems reviewed and are negative.    PHYSICAL EXAM: VS:  BP 90/60 (BP Location: Left Arm, Patient Position: Sitting, Cuff Size: Normal)   Pulse 61   Ht 5\' 8"  (1.727 m)   Wt 220 lb 8 oz (100 kg)   BMI 33.53 kg/m  , BMI Body mass index is 33.53 kg/m. Constitutional:  oriented to person, place, and time. No distress.  HENT:  Head: Normocephalic and atraumatic.  Eyes:  no discharge. No scleral icterus.  Neck:  Normal range of motion. Neck supple. No JVD present.  Cardiovascular: Normal rate, regular rhythm, normal heart sounds and intact distal pulses. Exam reveals no gallop and no friction rub. No edema No murmur heard. Pulmonary/Chest: Effort normal and breath sounds normal. No stridor. No respiratory distress.  no wheezes.  no rales.  no tenderness.  Abdominal: Soft.  no distension.  no tenderness.  Musculoskeletal: Normal range of motion.  no  tenderness or deformity.  Neurological:  normal muscle tone. Coordination normal. No atrophy Skin: Skin is warm and dry. No rash noted. not diaphoretic.  Psychiatric:  normal mood and affect. behavior is normal. Thought content normal.    Recent Labs: 12/18/2016: BUN 17; Potassium 3.9; Sodium 137 02/13/2017: Creatinine, Ser 1.00    Lipid Panel Lab Results  Component Value Date   CHOL 245 (H) 10/23/2011   HDL 26 (L) 10/23/2011   LDLCALC 166 (H) 10/23/2011   TRIG 265 (H) 10/23/2011      Wt Readings from Last 3 Encounters:  05/21/17 220 lb 8 oz (100 kg)  02/19/17 217  lb (98.4 kg)  12/04/16 217 lb 8 oz (98.7 kg)       ASSESSMENT AND PLAN:   Left main coronary artery disease - Denies any symptoms concerning for angina No further testing at this time Stress test in 2017 with no ischemia Recent MRI scan done ejection fraction 42 percent  Stable angina Recommended regular exercise walking program Decrease carvedilol secondary to low blood pressure and orthostasis  Shortness of breath Breathing has improved on current medication Recommended regular walking program  S/P CABG x 3 Stress test last year with no ischemia Recent MRI  S/P radiation therapy  previous Hodgkin's lymphoma, radiation likely contributing to underlying coronary disease   Hodgkin lymphoma, unspecified Hodgkin lymphoma type, unspecified body region [facility])  remote history in 1994   Congestive dilated cardiomyopathy ([place]) Ischemic cardia myopathy ejection fraction  20-25% on echo On his medications ejection fraction 6 months later up to 42% on MRI Having orthostasis symptoms we'll decrease carvedilol down to 3.125 mill grams twice a day  Chronic systolic CHF (congestive heart failure) ([place]) Continue current medications with change as detailed above  Disposition:   F/U  6 month   Total encounter time more than 25 minutes  Greater than 50% was spent in counseling and coordination of care with the patient    Orders Placed This Encounter  Procedures  . EKG 12-Lead     Signed, Esmond Plants, M.D., Ph.D. 05/21/2017  [place], [place]

## 2017-05-20 NOTE — Telephone Encounter (Signed)
All cardiac medications were refilled on 05/15/2017 by Othelia Pulling, [place].  See phone note from 05/15/2017.

## 2017-05-20 NOTE — Telephone Encounter (Signed)
PA request sent by the pharmacy for the patient's entresto 49/51 mg tablets. PA has been submitted via CoverMyMeds. We are waiting for a decision from the patient's insurance company.

## 2017-05-21 ENCOUNTER — Encounter: Payer: Self-pay | Admitting: Cardiovascular Disease

## 2017-05-21 ENCOUNTER — Ambulatory Visit: Payer: 59 | Admitting: Cardiovascular Disease

## 2017-05-21 VITALS — BP 90/60 | HR 61 | Ht 68.0 in | Wt 220.5 lb

## 2017-05-21 DIAGNOSIS — I25118 Atherosclerotic heart disease of native coronary artery with other forms of angina pectoris: Secondary | ICD-10-CM | POA: Diagnosis not present

## 2017-05-21 DIAGNOSIS — I5022 Chronic systolic (congestive) heart failure: Secondary | ICD-10-CM

## 2017-05-21 DIAGNOSIS — Z951 Presence of aortocoronary bypass graft: Secondary | ICD-10-CM | POA: Diagnosis not present

## 2017-05-21 DIAGNOSIS — R0602 Shortness of breath: Secondary | ICD-10-CM | POA: Diagnosis not present

## 2017-05-21 DIAGNOSIS — I255 Ischemic cardiomyopathy: Secondary | ICD-10-CM

## 2017-05-21 DIAGNOSIS — E7800 Pure hypercholesterolemia, unspecified: Secondary | ICD-10-CM | POA: Diagnosis not present

## 2017-05-21 MED ORDER — CARVEDILOL 3.125 MG PO TABS: 3.1250 mg | ORAL_TABLET | Freq: Two times a day (BID) | ORAL | 3 refills | Status: DC

## 2017-05-21 MED ORDER — SACUBITRIL-VALSARTAN 49-51 MG PO TABS: 1.0000 | ORAL_TABLET | Freq: Two times a day (BID) | ORAL | 3 refills | Status: DC

## 2017-05-21 NOTE — Telephone Encounter (Signed)
Prior Authorization approved through [email or web address]   Your PA request has been approved. Additional information will be provided in the approval communication. (Message 1145)

## 2017-05-21 NOTE — Patient Instructions (Addendum)
Medication Samples have been provided to the patient.  Drug name: Delene Loll       Strength: 49/51 mg        Qty: 1 box  LOT: [id number]  Exp.Date: Jan 2021  Medication Instructions:   Decrease the coreg down 3.125 mg twice a day  If blood pressure runs low, Cut the lasix in 1/2 every other day   Labwork:  No new labs needed  Testing/Procedures:  No further testing at this time   Follow-Up: It was a pleasure seeing you in the office today. Please call us if you have new issues that need to be addressed before your next appt.  [phone]  Your physician wants you to follow-up in: 6 months.  You will receive a reminder letter in the mail two months in advance. If you don't receive a letter, please call our office to schedule the follow-up appointment.  If you need a refill on your cardiac medications before your next appointment, please call your pharmacy.  For educational health videos Log in to : [email or web address] Or : [email or web address], password : [email or web address]

## 2017-05-21 NOTE — Telephone Encounter (Signed)
Fax received from [employer] that the patient's entresto has been approved from 05/20/17-05/20/18.  [employer] notified. They did run this and got a paid claim.

## 2017-06-02 ENCOUNTER — Other Ambulatory Visit: Payer: Self-pay | Admitting: Cardiovascular Disease

## 2017-06-03 MED ORDER — ROSUVASTATIN CALCIUM 40 MG PO TABS: 40.0000 mg | ORAL_TABLET | Freq: Every day | ORAL | 2 refills | Status: DC

## 2017-06-03 MED ORDER — EZETIMIBE 10 MG PO TABS: 10.0000 mg | ORAL_TABLET | Freq: Every day | ORAL | 2 refills | Status: DC

## 2017-07-05 ENCOUNTER — Encounter: Payer: Self-pay | Admitting: Cardiovascular Disease

## 2017-08-10 ENCOUNTER — Encounter: Payer: Self-pay | Admitting: Cardiovascular Disease

## 2017-08-10 NOTE — Telephone Encounter (Signed)
Defer to Dr. Rockey Situ. I have never seen this patient.

## 2017-09-14 NOTE — Progress Notes (Signed)
Cardiology Office Note  Date:  09/15/2017   ID:  RHET RORKE, DOB 01/11/1965, MRN [id number]  PCP:  Tracie Harrier, MD   Chief Complaint  Patient presents with  . OTHER    C/o Hypotension. Meds reviewed verbally with pt.    HPI:  52 year old gentleman with history of  Hodgkin's lymphoma with treatment in 1994, 1995 including chemotherapy and radiation , smoking history for 20 years,  coronary artery disease, bypass surgery 3 with  LIMA,left radial, RIMA  October 2013, cardiomyopathy with ejection fraction 30% noted prior to bypass surgery  33% by stress test August 2017, no ischemia  hyperlipidemia  chronic systolic CHF, on entresto Left bundle branch block noted prior to bypass surgery Mild bilateral carotid disease in 2013 who presents  for management of his ischemia cardiomyopathy.  In follow-up today he reports that he is stable Continues to have shortness of breath with stairs and with walking long distances Weight is up 6 pounds over the past several months No regular exercise program Still working Poor diet Sparingly takes Lasix  Prior testing reviewed with him in detail Echocardiogram August 2018 Ejection fraction 20-25% diffuse hypokinesis severe hypokinesis anterior wall anteroseptal and apical region Mildly dilated aortic root  Cardiac MRI [name] 2019 1. Normal LV size with septal-lateral dyssynchrony and mild diffuse hypokinesis, EF 43%. 2.  Normal RV size and systolic function. 3.  Moderate mitral regurgitation. 4.  Minimal coronary pattern LGE.  Seen by Dr. Caryl Comes and felt not be ICD candidate Chronic left bundle branch block unable to tolerate Aldactone had breast tenderness  Lab work reviewed with him Total cholesterol 100, LDL 40, TSH 1.8, normal renal function  EKG personally reviewed by myself on todays visit Shows sinus bradycardia 69 bpm left bundle branch block  Other records reviewed with him Stress test: 09/05/15 Exercise myocardial  perfusion imaging study with no significant  ischemia Fixed defect mid to apical anterior wall, apical septal region and apical region Anteroseptal and septal wall hypokinesis, EF estimated at 33%  Abnormal wall motion possibly secondary to left bundle branch block  Prior to the bypass surgery his legs felt heavy, had significant fatigue He had abnormal EKG leading to echocardiogram and stress test which led to catheterization and then bypass surgery  Outside Echo 2016: EF 30 Severe MR?, moderate TR, RVSP 47+ RA  Outside echo, Ejection fraction 25%  On 03/14/15 Moderate MR  History of Hodgkin's lymphoma, hypertension, left bundle branch block Quit smoking 21 yrs back ( 1/2 ppd x 5-6 yrs)  Catheterization report reviewed showing 85% left main disease, 60% RCA disease, 50% circumflex disease   PMH:   has a past medical history of Coronary artery disease, Family history of coronary artery disease (10/23/2011), Hodgkin's lymphoma ([place]) (1994), Hyperlipidemia (10/23/2011), Hypertension, Left main coronary artery disease (10/22/2011), 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  PSH:    Past Surgical History:  Procedure Laterality Date  . CARDIAC CATHETERIZATION    . CARPAL TUNNEL RELEASE    . CORONARY ARTERY BYPASS GRAFT  10/24/2011   Procedure: CORONARY ARTERY BYPASS GRAFTING (CABG);  Surgeon: Rexene Alberts, MD;  Location: [place];  Service: Open Heart Surgery;  Laterality: N/A;  Coronary Artery Bypass Grafting X 3 Using Left Internal  Mammary Artery, Right Internal Mammary Artery and Right Radial Artery  . LYMPH NODE BIOPSY  1994   left neck  . RADIAL ARTERY HARVEST  10/24/2011   Procedure: RADIAL ARTERY  HARVEST;  Surgeon: Rexene Alberts, MD;  Location: [facility] OR;  Service: Vascular;  Laterality: Left;    Current Outpatient Medications  Medication Sig Dispense Refill  . allopurinol (ZYLOPRIM) 300 MG tablet Take 300 mg by mouth  daily.    Marland Kitchen aspirin EC 81 MG tablet Take 81 mg by mouth daily.    . carvedilol (COREG) 3.125 MG tablet Take 1 tablet (3.125 mg total) by mouth 2 (two) times daily. 180 tablet 3  . ezetimibe (ZETIA) 10 MG tablet Take 1 tablet (10 mg total) by mouth daily. 90 tablet 2  . furosemide (LASIX) 40 MG tablet Take 1 tablet (40 mg total) by mouth daily as needed. 30 tablet 0  . potassium chloride SA (K-DUR,KLOR-CON) 10 MEQ tablet Take 1 tablet (10 mEq total) daily as needed by mouth. 30 tablet 6  . rosuvastatin (CRESTOR) 40 MG tablet Take 1 tablet (40 mg total) by mouth daily. 90 tablet 2  . sacubitril-valsartan (ENTRESTO) 49-51 MG Take 1 tablet by mouth 2 (two) times daily. 180 tablet 3  . sennosides-docusate sodium (SENOKOT-S) 8.6-50 MG tablet Take 4 tablets by mouth daily.    . vitamin B-12 (CYANOCOBALAMIN) 1000 MCG tablet Take 1 tablet (1,000 mcg total) by mouth daily. 30 tablet 1   No current facility-administered medications for this visit.      Allergies:   Spironolactone  Knots in breasts  Social History:  The patient  reports that he quit smoking about 20 years ago. He smoked 0.50 packs per day. His smokeless tobacco use includes snuff. He reports that he does not drink alcohol or use drugs.   Family History:   family history includes Coronary artery disease in his unknown relative; Heart attack in his father; Heart disease in his mother.    Review of Systems: Review of Systems  Constitutional: Negative.        Weight gain  Respiratory: Positive for shortness of breath.   Cardiovascular: Negative.   Gastrointestinal: Negative.   Musculoskeletal: Negative.   Neurological: Negative.   Psychiatric/Behavioral: Negative.   All other systems reviewed and are negative.   PHYSICAL EXAM: VS:  BP 98/60 (BP Location: Left Arm, Patient Position: Sitting, Cuff Size: Normal)   Pulse 69   Ht 5\' 8"  (1.727 m)   Wt 223 lb 12 oz (101.5 kg)   BMI 34.02 kg/m  , BMI Body mass index is 34.02 kg/m.   No significant change in exam Constitutional:  oriented to person, place, and time. No distress.  HENT:  Head: Normocephalic and atraumatic.  Eyes:  no discharge. No scleral icterus.  Neck: Normal range of motion. Neck supple. No JVD present.  Cardiovascular: Normal rate, regular rhythm, normal heart sounds and intact distal pulses. Exam reveals no gallop and no friction rub. No edema No murmur heard. Pulmonary/Chest: Effort normal and breath sounds normal. No stridor. No respiratory distress.  no wheezes.  no rales.  no tenderness.  Abdominal: Soft.  no distension.  no tenderness.  Musculoskeletal: Normal range of motion.  no  tenderness or deformity.  Neurological:  normal muscle tone. Coordination normal. No atrophy Skin: Skin is warm and dry. No rash noted. not diaphoretic.  Psychiatric:  normal mood and affect. behavior is normal. Thought content normal.    Recent Labs: 12/18/2016: BUN 17; Potassium 3.9; Sodium 137 02/13/2017: Creatinine, Ser 1.00    Lipid Panel Lab Results  Component Value Date   CHOL 245 (H) 10/23/2011   HDL 26 (L) 10/23/2011   LDLCALC  166 (H) 10/23/2011   TRIG 265 (H) 10/23/2011      Wt Readings from Last 3 Encounters:  09/15/17 223 lb 12 oz (101.5 kg)  05/21/17 220 lb 8 oz (100 kg)  02/19/17 217 lb (98.4 kg)       ASSESSMENT AND PLAN:  Left main coronary artery disease - Denies any symptoms concerning for angina No further testing at this time Stress test in 2017 with no ischemia  MRI scan done ejection fraction 43 percent No changes to his medications Stressed the importance of a walkin/ exercise program  Stable angina Recommended regular exercise walking program Takes carvedilol only once a day secondary to hypotension Cholesterol at goal  Shortness of breath weight is higher, chronic shortness of breath on exertion possibly exacerbated by conditioning Recommended weight loss and walking program  S/P CABG x 3 Stress test last year  with no ischemia  MRI ejection fraction 43% Stable symptoms  S/P radiation therapy  previous Hodgkin's lymphoma, radiation likely contributing to underlying coronary disease   Hodgkin lymphoma, unspecified Hodgkin lymphoma type, unspecified body region [facility])  remote history in 1994   Congestive dilated cardiomyopathy ([place]) Ischemic cardia myopathy ejection fraction 20-25% on echo On his medications ejection fraction 6 months later up to 42% on MRI Overall feels his symptoms are stable Reckoned weight loss and walking program  Chronic systolic CHF (congestive heart failure) ([facility]) Continue current medications with change as detailed above  Disposition:   F/U  12 month   Total encounter time more than 25 minutes  Greater than 50% was spent in counseling and coordination of care with the patient    Orders Placed This Encounter  Procedures  . EKG 12-Lead     Signed, Esmond Plants, M.D., Ph.D. 09/15/2017  [place], [place]

## 2017-09-15 ENCOUNTER — Encounter: Payer: Self-pay | Admitting: Cardiovascular Disease

## 2017-09-15 ENCOUNTER — Ambulatory Visit: Payer: 59 | Admitting: Cardiovascular Disease

## 2017-09-15 VITALS — BP 98/60 | HR 69 | Ht 68.0 in | Wt 223.8 lb

## 2017-09-15 DIAGNOSIS — I2510 Atherosclerotic heart disease of native coronary artery without angina pectoris: Secondary | ICD-10-CM

## 2017-09-15 DIAGNOSIS — R0602 Shortness of breath: Secondary | ICD-10-CM

## 2017-09-15 DIAGNOSIS — I200 Unstable angina: Secondary | ICD-10-CM | POA: Diagnosis not present

## 2017-09-15 DIAGNOSIS — Z951 Presence of aortocoronary bypass graft: Secondary | ICD-10-CM | POA: Diagnosis not present

## 2017-09-15 DIAGNOSIS — I25118 Atherosclerotic heart disease of native coronary artery with other forms of angina pectoris: Secondary | ICD-10-CM

## 2017-09-15 DIAGNOSIS — E7800 Pure hypercholesterolemia, unspecified: Secondary | ICD-10-CM

## 2017-09-15 DIAGNOSIS — I255 Ischemic cardiomyopathy: Secondary | ICD-10-CM

## 2017-09-15 DIAGNOSIS — I5022 Chronic systolic (congestive) heart failure: Secondary | ICD-10-CM

## 2017-09-15 NOTE — Patient Instructions (Signed)

## 2018-02-10 ENCOUNTER — Encounter: Payer: Self-pay | Admitting: Cardiovascular Disease

## 2018-02-10 NOTE — Progress Notes (Signed)
Per patient My Chart message- he is asking for entresto 49/51 mg samples.  Samples provided (placed the front desk) Entresto 49/51 mg Lot: [id number] Exp: 4/22 # 1 bottle (28 tablets)

## 2018-03-18 ENCOUNTER — Telehealth: Payer: Self-pay

## 2018-03-18 MED ORDER — ROSUVASTATIN CALCIUM 40 MG PO TABS: 40.0000 mg | ORAL_TABLET | Freq: Every day | ORAL | 1 refills | Status: DC

## 2018-03-18 NOTE — Telephone Encounter (Signed)
RX sent to [facility]

## 2018-05-11 MED ORDER — EZETIMIBE 10 MG PO TABS: 10.0000 mg | ORAL_TABLET | Freq: Every day | ORAL | 3 refills | Status: DC

## 2018-05-25 ENCOUNTER — Telehealth: Payer: Self-pay

## 2018-05-25 NOTE — Telephone Encounter (Signed)
Called patient.  No answer. LMOV.  Need to verify patients insurance and Member ID.  PA for Delene Loll keeps saying No Eligibility found when entering the information we have on file.

## 2018-05-25 NOTE — Telephone Encounter (Signed)
Patient returned call.  Made him aware we have a PA that we have to complete for his Delene Loll but it keeps giving me a message that there is no Eligibility found on the insurance information that we have listed. He stated that he has went with his company insurance and has not received his card yet. He stated he would call HR and get this information and call us back.

## 2018-06-01 ENCOUNTER — Telehealth: Payer: Self-pay | Admitting: *Deleted

## 2018-06-01 NOTE — Telephone Encounter (Signed)
Medication Samples have been provided to the patient.  Drug name: Delene Loll       Strength: 49/51,g        Qty: 1 box  LOT: [id number]  Exp.Date: 01/2019  Patient made aware.

## 2018-06-18 ENCOUNTER — Telehealth: Payer: Self-pay

## 2018-06-18 MED ORDER — CARVEDILOL 3.125 MG PO TABS: 3.1250 mg | ORAL_TABLET | Freq: Two times a day (BID) | ORAL | 0 refills | Status: DC

## 2018-06-18 NOTE — Telephone Encounter (Signed)
Requested Prescriptions   Signed Prescriptions Disp Refills  . carvedilol (COREG) 3.125 MG tablet 180 tablet 0    Sig: Take 1 tablet (3.125 mg total) by mouth 2 (two) times daily.    Authorizing Provider: Minna Merritts    Ordering User: Raelene Bott, Ryin Ambrosius L

## 2018-07-07 ENCOUNTER — Other Ambulatory Visit: Payer: Self-pay | Admitting: Cardiovascular Disease

## 2018-07-07 MED ORDER — ENTRESTO 49-51 MG PO TABS: 1.0000 | ORAL_TABLET | Freq: Two times a day (BID) | ORAL | 3 refills | Status: DC

## 2018-07-07 NOTE — Telephone Encounter (Signed)
Pharmacy calling . Medication sent

## 2018-09-02 ENCOUNTER — Other Ambulatory Visit: Payer: Self-pay | Admitting: *Deleted

## 2018-09-02 ENCOUNTER — Telehealth: Payer: Self-pay | Admitting: Cardiovascular Disease

## 2018-09-02 MED ORDER — CARVEDILOL 3.125 MG PO TABS: 3.1250 mg | ORAL_TABLET | Freq: Two times a day (BID) | ORAL | 0 refills | Status: DC

## 2018-09-02 MED ORDER — ROSUVASTATIN CALCIUM 40 MG PO TABS: 40.0000 mg | ORAL_TABLET | Freq: Every day | ORAL | 0 refills | Status: DC

## 2018-09-02 NOTE — Telephone Encounter (Signed)
°*  STAT* If patient is at the pharmacy, call can be transferred to refill team.   1. Which medications need to be refilled? (please list name of each medication and dose if known)  rosuvastatin 40 MG Carvedilol 3.125 MG   2. Which pharmacy/location (including street and city if local pharmacy) is medication to be sent to? [facility]  3. Do they need a 30 day or 90 day supply? 30 day

## 2018-09-02 NOTE — Telephone Encounter (Signed)
Requested Prescriptions   Signed Prescriptions Disp Refills  . carvedilol (COREG) 3.125 MG tablet 60 tablet 0    Sig: Take 1 tablet (3.125 mg total) by mouth 2 (two) times daily.    Authorizing Provider: Minna Merritts    Ordering User: Eugenio Hoes, Simi Briel C  . rosuvastatin (CRESTOR) 40 MG tablet 30 tablet 0    Sig: Take 1 tablet (40 mg total) by mouth daily.    Authorizing Provider: Minna Merritts    Ordering User: Britt Bottom

## 2018-09-02 NOTE — Telephone Encounter (Signed)
Requested Prescriptions   Signed Prescriptions Disp Refills  . carvedilol (COREG) 3.125 MG tablet 60 tablet 0    Sig: Take 1 tablet (3.125 mg total) by mouth 2 (two) times daily.    Authorizing Provider: Minna Merritts    Ordering User: Eugenio Hoes, Almalik Weissberg C  . rosuvastatin (CRESTOR) 40 MG tablet 30 tablet 0    Sig: Take 1 tablet (40 mg total) by mouth daily.    Authorizing Provider: Minna Merritts    Ordering User: Britt Bottom

## 2018-09-19 NOTE — Progress Notes (Signed)
Cardiology Office Note  Date:  09/21/2018   ID:  Willie [name], DOB 1965-05-13, MRN [id number]  PCP:  Tracie Harrier, MD   Chief Complaint  Patient presents with  . other    55 month f/u no complaints today. Meds reviewed verbally with pt.    HPI:  53 year old gentleman with history of  Hodgkin's lymphoma with treatment in 1994, 1995 including chemotherapy and radiation , smoking history for 20 years,  coronary artery disease, bypass surgery 3 with  LIMA,left radial, RIMA  October 2013, cardiomyopathy with ejection fraction 30% noted prior to bypass surgery  33% by stress test August 2017, no ischemia  hyperlipidemia  chronic systolic CHF, on entresto Left bundle branch block noted prior to bypass surgery Mild bilateral carotid disease in 2013 unable to tolerate Aldactone had breast tenderness who presents  for management of his ischemia cardiomyopathy.  Feels ok, Little dizzy when standing up Denies significant shortness of breath or chest pain on exertion  Rarely has ankle swelling On those occasions would take Lasix as needed with potassium  Some fatigue Working for [employer] Feels his weight is stable No regular exercise  Previous echo and MRI reviewed  Lab work reviewed with him Total cholesterol 100, LDL 40, TSH 1.8, normal renal function  EKG personally reviewed by myself on todays visit Shows sinus bradycardia 66 bpm left bundle branch block  Echocardiogram August 2018 Ejection fraction 20-25% diffuse hypokinesis severe hypokinesis anterior wall anteroseptal and apical region Mildly dilated aortic root  Cardiac MRI [name] 2019 1. Normal LV size with septal-lateral dyssynchrony and mild diffuse hypokinesis, EF 43%. 2.  Normal RV size and systolic function. 3.  Moderate mitral regurgitation. 4.  Minimal coronary pattern LGE.  Seen by Dr. Caryl Comes and felt not be ICD candidate Chronic left bundle branch block  Other records reviewed  with him Stress test: 09/05/15 Exercise myocardial perfusion imaging study with no significant  ischemia Fixed defect mid to apical anterior wall, apical septal region and apical region Anteroseptal and septal wall hypokinesis, EF estimated at 33%  Abnormal wall motion possibly secondary to left bundle branch block  Prior to the bypass surgery his legs felt heavy, had significant fatigue He had abnormal EKG leading to echocardiogram and stress test which led to catheterization and then bypass surgery  Outside Echo 2016: EF 30 Severe MR?, moderate TR, RVSP 47+ RA  Outside echo, Ejection fraction 25%  On 03/14/15 Moderate MR  History of Hodgkin's lymphoma, hypertension, left bundle branch block Quit smoking 21 yrs back ( 1/2 ppd x 5-6 yrs)  Catheterization report reviewed showing 85% left main disease, 60% RCA disease, 50% circumflex disease   PMH:   has a past medical history of Coronary artery disease, Family history of coronary artery disease (10/23/2011), Hodgkin's lymphoma ([place]) (1994), Hyperlipidemia (10/23/2011), Hypertension, Left main coronary artery disease (10/22/2011), S/P CABG x 3 (10/24/2011), S/P radiation therapy (10/21/1992), and Unstable angina ([place]) (10/22/2011).  PSH:    Past Surgical History:  Procedure Laterality Date  . CARDIAC CATHETERIZATION    . CARPAL TUNNEL RELEASE    . CORONARY ARTERY BYPASS GRAFT  10/24/2011   Procedure: CORONARY ARTERY BYPASS GRAFTING (CABG);  Surgeon: Rexene Alberts, MD;  Location: [place];  Service: Open Heart Surgery;  Laterality: N/A;  Coronary Artery Bypass Grafting X 3 Using Left Internal  Mammary Artery, Right Internal Mammary Artery and Right Radial Artery  . LYMPH NODE BIOPSY  1994   left neck  . RADIAL ARTERY  HARVEST  10/24/2011   Procedure: RADIAL ARTERY HARVEST;  Surgeon: Rexene Alberts, MD;  [facility];  Service: Vascular;  Laterality: Left;    Current Outpatient Medications  Medication Sig Dispense Refill  .  allopurinol (ZYLOPRIM) 300 MG tablet Take 300 mg by mouth daily.    Marland Kitchen aspirin EC 81 MG tablet Take 81 mg by mouth daily.    . carvedilol (COREG) 3.125 MG tablet Take 1 tablet (3.125 mg total) by mouth 2 (two) times daily. 180 tablet 3  . colchicine (COLCRYS) 0.6 MG tablet Take by mouth daily as needed.     . ezetimibe (ZETIA) 10 MG tablet Take 1 tablet (10 mg total) by mouth daily. 90 tablet 3  . furosemide (LASIX) 40 MG tablet Take 1 tablet (40 mg total) by mouth daily as needed. 90 tablet 3  . pantoprazole (PROTONIX) 40 MG tablet Take by mouth as needed.    . potassium chloride (K-DUR) 10 MEQ tablet Take 1 tablet (10 mEq total) by mouth daily as needed. 90 tablet 3  . rosuvastatin (CRESTOR) 40 MG tablet Take 1 tablet (40 mg total) by mouth daily. 90 tablet 3  . sacubitril-valsartan (ENTRESTO) 49-51 MG Take 1 tablet by mouth 2 (two) times daily. 180 tablet 3  . sennosides-docusate sodium (SENOKOT-S) 8.6-50 MG tablet Take 4 tablets by mouth daily.    . vitamin B-12 (CYANOCOBALAMIN) 1000 MCG tablet Take 1 tablet (1,000 mcg total) by mouth daily. 30 tablet 1   No current facility-administered medications for this visit.     Allergies:   Spironolactone  Knots in breasts  Social History:  The patient  reports that he quit smoking about 21 years ago. He smoked 0.50 packs per day. His smokeless tobacco use includes snuff. He reports that he does not drink alcohol or use drugs.   Family History:   family history includes Coronary artery disease in his unknown relative; Heart attack in his father; Heart disease in his mother.    Review of Systems: Review of Systems  Constitutional: Negative.        Weight gain  HENT: Negative.   Respiratory: Negative.   Cardiovascular: Negative.   Gastrointestinal: Negative.   Musculoskeletal: Negative.   Neurological: Negative.   Psychiatric/Behavioral: Negative.   All other systems reviewed and are negative.   PHYSICAL EXAM: VS:  BP 138/90 (BP  Location: Left Arm, Patient Position: Sitting, Cuff Size: Normal)   Pulse 66   Ht 5\' 8"  (1.727 m)   Wt 223 lb (101.2 kg)   SpO2 98%   BMI 33.91 kg/m  , BMI Body mass index is 33.91 kg/m.  Constitutional:  oriented to person, place, and time. No distress.  HENT:  Head: Grossly normal Eyes:  no discharge. No scleral icterus.  Neck: No JVD, no carotid bruits  Cardiovascular: Regular rate and rhythm, no murmurs appreciated Pulmonary/Chest: Clear to auscultation bilaterally, no wheezes or rails Abdominal: Soft.  no distension.  no tenderness.  Musculoskeletal: Normal range of motion Neurological:  normal muscle tone. Coordination normal. No atrophy Skin: Skin warm and dry Psychiatric: normal affect, pleasant   Recent Labs: No results found for requested labs within last 8760 hours.    Lipid Panel Lab Results  Component Value Date   CHOL 245 (H) 10/23/2011   HDL 26 (L) 10/23/2011   LDLCALC 166 (H) 10/23/2011   TRIG 265 (H) 10/23/2011    Wt Readings from Last 3 Encounters:  09/21/18 223 lb (101.2 kg)  09/15/17 223  lb 12 oz (101.5 kg)  05/21/17 220 lb 8 oz (100 kg)     ASSESSMENT AND PLAN:  Left main coronary artery disease - Currently with no symptoms of angina.  Continue current medication regimen.  CAD with Stable angina Compliant with his medications, cholesterol at goal Denies anginal symptoms No further testing at this time  Shortness of breath Weight is stable Recommended regular exercise program He is interested in repeating echocardiogram in 6 months time, order placed to evaluate ejection fraction  S/P CABG x 3  MRI ejection fraction 43% Prior stress test no ischemia Echocardiogram ordered for 6 months at his request  S/P radiation therapy  previous Hodgkin's lymphoma, radiation likely contributing to underlying coronary disease  No recurrence  Hodgkin lymphoma, unspecified Hodgkin lymphoma type, unspecified body region [facility])  remote history in 1994    Congestive dilated cardiomyopathy ([place]) Ischemic cardia myopathy ejection fraction 20-25% on echo On his medications ejection fraction 6 months later up to 42% on MRI He is interested in repeating echocardiogram in 6 months time on the medications Order placed  Chronic systolic CHF (congestive heart failure) ([place]) Continue current medications with change as detailed above Discussed various medication changes available, does not want spironolactone given side effects, already has dizziness and does not want to increase his Entresto  Disposition:   F/U  12 month   Total encounter time more than 25 minutes  Greater than 50% was spent in counseling and coordination of care with the patient    Orders Placed This Encounter  Procedures  . EKG 12-Lead  . ECHOCARDIOGRAM COMPLETE     Signed, Esmond Plants, M.D., Ph.D. 09/21/2018  [place], [place]

## 2018-09-21 ENCOUNTER — Ambulatory Visit (INDEPENDENT_AMBULATORY_CARE_PROVIDER_SITE_OTHER): Payer: 59 | Admitting: Cardiovascular Disease

## 2018-09-21 ENCOUNTER — Encounter: Payer: Self-pay | Admitting: Cardiovascular Disease

## 2018-09-21 ENCOUNTER — Other Ambulatory Visit: Payer: Self-pay

## 2018-09-21 VITALS — BP 138/90 | HR 66 | Ht 68.0 in | Wt 223.0 lb

## 2018-09-21 DIAGNOSIS — I5022 Chronic systolic (congestive) heart failure: Secondary | ICD-10-CM

## 2018-09-21 DIAGNOSIS — Z951 Presence of aortocoronary bypass graft: Secondary | ICD-10-CM

## 2018-09-21 DIAGNOSIS — R0602 Shortness of breath: Secondary | ICD-10-CM

## 2018-09-21 DIAGNOSIS — I255 Ischemic cardiomyopathy: Secondary | ICD-10-CM

## 2018-09-21 DIAGNOSIS — I25118 Atherosclerotic heart disease of native coronary artery with other forms of angina pectoris: Secondary | ICD-10-CM

## 2018-09-21 DIAGNOSIS — I2510 Atherosclerotic heart disease of native coronary artery without angina pectoris: Secondary | ICD-10-CM

## 2018-09-21 DIAGNOSIS — E7800 Pure hypercholesterolemia, unspecified: Secondary | ICD-10-CM | POA: Diagnosis not present

## 2018-09-21 MED ORDER — ROSUVASTATIN CALCIUM 40 MG PO TABS: 40.0000 mg | ORAL_TABLET | Freq: Every day | ORAL | 3 refills | Status: DC

## 2018-09-21 MED ORDER — FUROSEMIDE 40 MG PO TABS: 40.0000 mg | ORAL_TABLET | Freq: Every day | ORAL | 3 refills | Status: DC | PRN

## 2018-09-21 MED ORDER — CARVEDILOL 3.125 MG PO TABS: 3.1250 mg | ORAL_TABLET | Freq: Two times a day (BID) | ORAL | 3 refills | Status: DC

## 2018-09-21 MED ORDER — POTASSIUM CHLORIDE CRYS ER 10 MEQ PO TBCR: 10.0000 meq | EXTENDED_RELEASE_TABLET | Freq: Every day | ORAL | 3 refills | Status: DC | PRN

## 2018-09-21 MED ORDER — ENTRESTO 49-51 MG PO TABS: 1.0000 | ORAL_TABLET | Freq: Two times a day (BID) | ORAL | 3 refills | Status: DC

## 2018-09-21 MED ORDER — EZETIMIBE 10 MG PO TABS: 10.0000 mg | ORAL_TABLET | Freq: Every day | ORAL | 3 refills | Status: DC

## 2018-09-21 NOTE — Patient Instructions (Addendum)
Medication Instructions:  No changes  If you need a refill on your cardiac medications before your next appointment, please call your pharmacy.    Lab work: No new labs needed   If you have labs (blood work) drawn today and your tests are completely normal, you will receive your results only by: Marland Kitchen MyChart Message (if you have MyChart) OR . A paper copy in the mail If you have any lab test that is abnormal or we need to change your treatment, we will call you to review the results.   Testing/Procedures: Echo in 6 months for cardiomyopathy  Your physician has requested that you have an echocardiogram. Echocardiography is a painless test that uses sound waves to create images of your heart. It provides your doctor with information about the size and shape of your heart and how well your heart's chambers and valves are working. This procedure takes approximately one hour. There are no restrictions for this procedure.     Follow-Up: At [facility], you and your health needs are our priority.  As part of our continuing mission to provide you with exceptional heart care, we have created designated Provider Care Teams.  These Care Teams include your primary Cardiologist (physician) and Advanced Practice Providers (APPs -  Physician Assistants and Nurse Practitioners) who all work together to provide you with the care you need, when you need it.  . You will need a follow up appointment in 12 months .   Please call our office 2 months in advance to schedule this appointment.    . Providers on your designated Care Team:   . Murray Hodgkins, NP . Christell Faith, PA-C . Marrianne Mood, PA-C  Any Other Special Instructions Will Be Listed Below (If Applicable).  For educational health videos Log in to : [email or web address] Or : [email or web address], password : [email or web address]

## 2019-03-10 DIAGNOSIS — I340 Nonrheumatic mitral (valve) insufficiency: Secondary | ICD-10-CM | POA: Insufficient documentation

## 2019-04-07 ENCOUNTER — Ambulatory Visit (INDEPENDENT_AMBULATORY_CARE_PROVIDER_SITE_OTHER): Payer: 59

## 2019-04-07 ENCOUNTER — Other Ambulatory Visit: Payer: Self-pay

## 2019-04-07 DIAGNOSIS — I255 Ischemic cardiomyopathy: Secondary | ICD-10-CM

## 2019-05-01 NOTE — Telephone Encounter (Signed)
Echo in 2017, 2018, 2021 All with EF 20 to 30%  The MRI was 43% Unclear if that measurement is accurate. Seems higher than the echos in general Last echo this past month 25-30%

## 2019-07-01 DIAGNOSIS — I255 Ischemic cardiomyopathy: Secondary | ICD-10-CM

## 2019-07-01 MED ORDER — ENTRESTO 24-26 MG PO TABS: 1.0000 | ORAL_TABLET | Freq: Two times a day (BID) | ORAL | 3 refills | Status: DC

## 2019-07-01 NOTE — Telephone Encounter (Signed)
Pt reports that BP has been dropping after morning medications for about 2 weeks now. Pt denies any recent changes to medications or lifestyle. No weight loss.   Getting out of breath at work after climbing steps. Improves w rest. Denies chest pain. Denies palpitations.   Denies any GI bleeding.   BP sys in 80s in ams. Today 88/65.  HR in 70s.  Dizziness and feeling like room is spinning. No falls.   Last used fluid pill 1 months ago. Uses very rarely, every couple weeks.   Takes medications as ordered.   Verbal from Ignacia Bayley, NP 1- reduce entresto to 24/26 mg BID 2- [facility].  3- f/u appt  Returned call to patient.  He verbalized understanding and is in agreement with POC.   Made appt for next Wednesday.  He will bring vs readings to his appt.   Advised pt to call for any further questions or concerns.

## 2019-07-04 NOTE — Addendum Note (Signed)
Addended by: Verlon Au on: 07/04/2019 03:00 PM   Modules accepted: Orders

## 2019-07-05 ENCOUNTER — Other Ambulatory Visit
Admission: RE | Admit: 2019-07-05 | Discharge: 2019-07-05 | Disposition: A | Discharge status: Home / Self Care | Payer: 59 | Attending: Cardiovascular Disease | Admitting: Cardiovascular Disease

## 2019-07-05 ENCOUNTER — Other Ambulatory Visit: Payer: Self-pay

## 2019-07-05 DIAGNOSIS — I255 Ischemic cardiomyopathy: Secondary | ICD-10-CM

## 2019-07-05 LAB — BASIC METABOLIC PANEL
Anion gap: 9 (ref 5–15)
BUN: 12 mg/dL (ref 6–20)
CO2: 25 mmol/L (ref 22–32)
Calcium: 9 mg/dL (ref 8.9–10.3)
Chloride: 105 mmol/L (ref 98–111)
Creatinine, Ser: 1.19 mg/dL (ref 0.61–1.24)
GFR calc Af Amer: >60 mL/min (ref >60)
GFR calc non Af Amer: >60 mL/min (ref >60)
Glucose, Bld: 118 mg/dL — ABNORMAL HIGH (ref 70–99)
Potassium: 4.7 mmol/L (ref 3.5–5.1)
Sodium: 139 mmol/L (ref 135–145)

## 2019-07-05 LAB — CBC
HCT: 44.1 % (ref 39.0–52.0)
Hemoglobin: 14.9 g/dL (ref 13.0–17.0)
MCH: 29.7 pg (ref 26.0–34.0)
MCHC: 33.8 g/dL (ref 30.0–36.0)
MCV: 88 fL (ref 80.0–100.0)
Platelets: 184 10*3/uL (ref 150–400)
RBC: 5.01 MIL/uL (ref 4.22–5.81)
RDW: 13.2 % (ref 11.5–15.5)
WBC: 6 10*3/uL (ref 4.0–10.5)
nRBC: 0 % (ref 0.0–0.2)

## 2019-07-06 ENCOUNTER — Ambulatory Visit: Payer: 59 | Admitting: Nurse Practitioner

## 2019-07-06 ENCOUNTER — Encounter: Payer: Self-pay | Admitting: Nurse Practitioner

## 2019-07-06 VITALS — BP 142/80 | HR 64 | Ht 68.0 in | Wt 223.0 lb

## 2019-07-06 DIAGNOSIS — I2510 Atherosclerotic heart disease of native coronary artery without angina pectoris: Secondary | ICD-10-CM

## 2019-07-06 DIAGNOSIS — I5020 Unspecified systolic (congestive) heart failure: Secondary | ICD-10-CM | POA: Diagnosis not present

## 2019-07-06 DIAGNOSIS — I10 Essential (primary) hypertension: Secondary | ICD-10-CM

## 2019-07-06 DIAGNOSIS — E785 Hyperlipidemia, unspecified: Secondary | ICD-10-CM

## 2019-07-06 DIAGNOSIS — I255 Ischemic cardiomyopathy: Secondary | ICD-10-CM | POA: Diagnosis not present

## 2019-07-06 DIAGNOSIS — I951 Orthostatic hypotension: Secondary | ICD-10-CM

## 2019-07-06 NOTE — Patient Instructions (Signed)
Medication Instructions:  Your physician recommends that you continue on your current medications as directed. Please refer to the Current Medication list given to you today.  *If you need a refill on your cardiac medications before your next appointment, please call your pharmacy*   Lab Work: None ordered.  If you have labs (blood work) drawn today and your tests are completely normal, you will receive your results only by: MyChart Message (if you have MyChart) OR A paper copy in the mail If you have any lab test that is abnormal or we need to change your treatment, we will call you to review the results.   Testing/Procedures: None ordered.    Follow-Up: At [facility], you and your health needs are our priority.  As part of our continuing mission to provide you with exceptional heart care, we have created designated Provider Care Teams.  These Care Teams include your primary Cardiologist (physician) and Advanced Practice Providers (APPs -  Physician Assistants and Nurse Practitioners) who all work together to provide you with the care you need, when you need it.  We recommend signing up for the patient portal called "MyChart".  Sign up information is provided on this After Visit Summary.  MyChart is used to connect with patients for Virtual Visits (Telemedicine).  Patients are able to view lab/test results, encounter notes, upcoming appointments, etc.  Non-urgent messages can be sent to your provider as well.   To learn more about what you can do with MyChart, go to [email or web address].    Your next appointment:   12 month(s)  The format for your next appointment:   In Person  Provider:   Dr Klein 

## 2019-07-06 NOTE — Progress Notes (Signed)
Office Visit    Patient Name: Willie [name] Date of Encounter: 07/06/2019  Primary Care Provider:  Tracie Harrier, MD Primary Cardiologist:  Willie Rogue, MD  Chief Complaint    54 year old male with a history of Hodgkin's lymphoma with treatment in 1994/95 (chemo and radiation), remote tobacco abuse, CAD status post three-vessel bypass in October 2013, ischemic cardiomyopathy with an EF of 25 to 30% (April 2021), HFrEF, left bundle branch block, mild carotid arterial disease, hypertension, and hyperlipidemia, who presents for follow-up related to low blood pressures.  Past Medical History    Past Medical History:  Diagnosis Date  . Coronary artery disease    a. 10/2011 Cath: LM 85, LAD 20p, D1 20, LCX 20/75m, OM1 85, RCA 60ost, 73m, 50d; b. 10/2011 CABG x 3: LIMA->LAD, RIMA->dRCA, L Radial->OM2; c. 08/2015 MV: EF 33%, apical ant, apical septal, apical infarct, no ischemia.  . Family history of coronary artery disease 10/23/2011  . HFrEF (heart failure with reduced ejection fraction) ([place])    a. 02/2017 cMRI: EF 43%, mod MR, minimal LGE, nl RV size/fxn; b. 04/2019 Echo: EF 25-30%, glob HK, gr2 DD, mod reduced RV fxn, triv MR/MR, Ao root 26mm.  . Hodgkin's lymphoma ([place]) 1994   neck/midchest area with radiation  . Hyperlipidemia 10/23/2011  . Hypertension   . Ischemic cardiomyopathy    a. 08/2016 Echo: EF 20-25%; b. 02/2017 cMRI: EF 43% (no indication for ICD based on this finding); c. 04/2019 Echo: EF 25-30%.  . Left main coronary artery disease 10/22/2011  . S/P CABG x 3 10/24/2011   LIMA to LAD, RIMA to RCA, LRA to OM2  . S/P radiation therapy 10/21/1992   Radiation therapy to chest and neck for Hodgkin's lymphoma  . Unstable angina ([place]) 10/22/2011   Past Surgical History:  Procedure Laterality Date  . CARDIAC CATHETERIZATION    . CARPAL TUNNEL RELEASE    . CORONARY ARTERY BYPASS GRAFT  10/24/2011   Procedure: CORONARY ARTERY BYPASS GRAFTING (CABG);  Surgeon: Willie Alberts, MD;  Location: [place];  Service: Open Heart Surgery;  Laterality: N/A;  Coronary Artery Bypass Grafting X 3 Using Left Internal  Mammary Artery, Right Internal Mammary Artery and Right Radial Artery  . LYMPH NODE BIOPSY  1994   left neck  . RADIAL ARTERY HARVEST  10/24/2011   Procedure: RADIAL ARTERY HARVEST;  Surgeon: Willie Alberts, MD;  Location: [place];  Service: Vascular;  Laterality: Left;    Allergies  Allergies  Allergen Reactions  . Spironolactone     Caused Chest swelling/knots around breast area.     History of Present Illness    54 year old male with the above complex past medical history including Hodgkin's lymphoma with chemotherapy and radiation in 1994/95, remote tobacco abuse, CAD status post three-vessel bypass in October 2013, ischemic cardiomyopathy, HFrEF, left bundle branch block, mild carotid arterial disease, hypertension, and hyperlipidemia. As noted above, following bypass he had persistent LV dysfunction with an EF of 20 to 25% in August 2018. He was seen by electrophysiology and underwent cardiac MRI in early 2019 showing an EF of 43% with moderate mitral gravitation, minimal LGE, and normal RV size and function. In this setting, ICD was not indicated. He was last seen in cardiology clinic in September 2020, at which time he reported intermittent lightheadedness with standing. No dose adjustments were made on medications and plan was for follow-up in a year. An echocardiogram was performed this past April showing persistent LV dysfunction  with an EF of 25-30% with global hypokinesis and grade 1 diastolic dysfunction. He was advised to continue medical therapy.  [name] contacted our office last week due to blood pressures running in the 80s with associated lightheadedness. We had him reduce his Entresto to 24/26 mg twice daily. He had lab work earlier this week which showed stable renal function, electrolytes, and blood counts.  Since reducing his Entresto  dose, he has been feeling much better.  Blood pressures at home are trending in the 1 teens.  He has not noticed any increase in swelling since reducing Entresto and has not needed a Lasix dose in several months.  He denies chest pain, dyspnea, palpitations, PND, orthopnea, dizziness, syncope, edema, or early satiety.  Home Medications    Prior to Admission medications   Medication Sig Start Date End Date Taking? Authorizing Provider  allopurinol (ZYLOPRIM) 300 MG tablet Take 300 mg by mouth daily.    [provider]  aspirin EC 81 MG tablet Take 81 mg by mouth daily.    [provider]  carvedilol (COREG) 3.125 MG tablet Take 1 tablet (3.125 mg total) by mouth 2 (two) times daily. 09/21/18 12/20/18  Willie Merritts, MD  colchicine (COLCRYS) 0.6 MG tablet Take by mouth daily as needed.  12/28/17   [provider]  ezetimibe (ZETIA) 10 MG tablet Take 1 tablet (10 mg total) by mouth daily. 09/21/18   Willie Merritts, MD  furosemide (LASIX) 40 MG tablet Take 1 tablet (40 mg total) by mouth daily as needed. 09/21/18   Willie Merritts, MD  potassium chloride (K-DUR) 10 MEQ tablet Take 1 tablet (10 mEq total) by mouth daily as needed. 09/21/18   Willie Merritts, MD  rosuvastatin (CRESTOR) 40 MG tablet Take 1 tablet (40 mg total) by mouth daily. 09/21/18   Willie Merritts, MD  sacubitril-valsartan (ENTRESTO) 24-26 MG Take 1 tablet by mouth 2 (two) times daily. 07/01/19   Willie Gianotti, NP  sennosides-docusate sodium (SENOKOT-S) 8.6-50 MG tablet Take 4 tablets by mouth daily.    [provider]  vitamin B-12 (CYANOCOBALAMIN) 1000 MCG tablet Take 1 tablet (1,000 mcg total) by mouth daily. 10/16/16   Willie Merritts, MD    Review of Systems    He denies chest pain, palpitations, dyspnea, pnd, orthopnea, n, v, dizziness, syncope, edema, weight gain, or early satiety.  All other systems reviewed and are otherwise negative except as noted  above.  Physical Exam    VS:  BP (!) 142/80 (BP Location: Left Arm, Patient Position: Sitting, Cuff Size: Normal)   Pulse 64   Ht 5\' 8"  (1.727 m)   Wt 223 lb (101.2 kg)   SpO2 98%   BMI 33.91 kg/m  , BMI Body mass index is 33.91 kg/m.  Orthostatic VS for the past 24 hrs:  BP- Lying Pulse- Lying BP- Sitting Pulse- Sitting BP- Standing at 0 minutes Pulse- Standing at 0 minutes  07/06/19 0917 145/80 60 148/78 71 143/83 74    GEN: Well nourished, well developed, in no acute distress. HEENT: normal. Neck: Supple, no JVD, carotid bruits, or masses. Cardiac: RRR, no murmurs, rubs, or gallops. No clubbing, cyanosis, edema.  Radials/PT 2+ and equal bilaterally.  Respiratory:  Respirations regular and unlabored, clear to auscultation bilaterally. GI: Soft, nontender, nondistended, BS + x 4. MS: no deformity or atrophy. Skin: warm and dry, no rash. Neuro:  Strength and sensation are intact. Psych: Normal affect.  Accessory Clinical  Findings    ECG personally reviewed by me today - RSR, 64, left bundle branch block- no acute changes.  Lab Results  Component Value Date   WBC 6.0 07/05/2019   HGB 14.9 07/05/2019   HCT 44.1 07/05/2019   MCV 88.0 07/05/2019   PLT 184 07/05/2019   Lab Results  Component Value Date   CREATININE 1.19 07/05/2019   BUN 12 07/05/2019   NA 139 07/05/2019   K 4.7 07/05/2019   CL 105 07/05/2019   CO2 25 07/05/2019   Lab Results  Component Value Date   ALT 33 06/13/2013   AST 28 06/13/2013   ALKPHOS 96 06/13/2013   BILITOT 0.5 06/13/2013   Labs drawn through primary care February 2021: Total cholesterol 98, triglycerides 131, HDL 30.5, LDL 41 Total bilirubin 0.6, total protein 6.8, albumin 4.3, alkaline phosphatase 83, AST 21, ALT 27   Assessment & Plan    1.  Orthostatic hypotension: Patient called last week due to orthostasis and blood pressures in the 80s associated with lightheadedness.  We reduced his Entresto to 24/26 mg twice daily and since  then, he has been feeling much better.  Blood pressures today is elevated however, his list of blood pressures at home over the past week show that he has been trending in the 1 teens.  Continue current doses of Entresto and carvedilol.  2.  Ischemic cardiomyopathy/HFrEF: Euvolemic on examination.  We discussed that he will need to monitor volume status closely in the setting of Entresto dose reduction as he may see an increased need for diuretic therapy, though he notes he has not required a Lasix in several months.  Most recent echo in April showed an EF of 25 to 30%.  This is similar to what was seen in 2018, when he was 20 to 25%.  In between those 2 echocardiograms, he had a cardiac MRI which showed an EF of 43% and based on that, decision was made in not pursue ICD therapy.  Patient is confused on how to interpret his most recent echo results in the context of all prior EF recordings.  He has been feeling well and is euvolemic on examination but we discussed that persistent LV dysfunction continues to increase his risk for arrhythmias.  In that setting, I am going to refer him back to Dr. Caryl Comes to rediscuss path forward and whether or not repeat cardiac MRI is appropriate.  He remains on beta-blocker and Entresto therapy.  Historically, blood pressures have been too soft for spironolactone.  3.  Coronary artery disease: Status post CABG x3 in October 2013 with nonischemic stress test in August 2017.  He has not been having any chest pain or dyspnea.  He remains on aspirin, beta-blocker, Zetia, and statin therapy.  4.  Essential hypertension: See above.  Continue current regimen.  Pressure is elevated today however he has been trending in the 1 teens at home and was orthostatic on higher doses of Entresto previously.  5.  Hyperlipidemia: Well-controlled on rosuvastatin and Zetia therapy with an LDL of 41 and normal LFTs in February of this year.  6.  Disposition: Continue current regimen.  Follow-up  with Dr. Caryl Comes as outlined above.  Murray Hodgkins, NP 07/06/2019, 9:43 AM

## 2019-08-25 ENCOUNTER — Ambulatory Visit: Payer: 59 | Admitting: Internal Medicine

## 2019-08-26 ENCOUNTER — Other Ambulatory Visit: Payer: Self-pay

## 2019-09-23 NOTE — Progress Notes (Signed)
Cardiology Office Note  Date:  09/26/2019   ID:  Willie [name], DOB 08/31/1965, MRN [id number]  PCP:  Tracie Harrier, MD   Chief Complaint  Patient presents with   other    1-2 month f/u no complaints today. Meds reviewed verbally with pt.    HPI:  54 year old gentleman with history of  Hodgkin's lymphoma with treatment in 1994, 1995 including chemotherapy and radiation , smoking history for 20 years,  coronary artery disease, bypass surgery 3 with  LIMA,left radial, RIMA  October 2013, cardiomyopathy with ejection fraction 30% noted prior to bypass surgery  33% by stress test August 2017, no ischemia  hyperlipidemia  chronic systolic CHF, on entresto Left bundle branch block noted prior to bypass surgery Mild bilateral carotid disease in 2013 unable to tolerate Aldactone had breast tenderness who presents  for management of his ischemia cardiomyopathy.  Last seen in clinic September 2020 Was seen by one of our providers July 06, 2019 for orthostatic hypotension Blood pressures were running in the 71G systolic, he reduced his Entresto Felt better  Denies episodes of chest pain No shortness of breath on exertion Denies orthostasis symptoms Works in [employer], supervisory position Does not use Lasix, does not have refill, Denies excessive fluid intake  Lab work reviewed HBA1c 5.8 Total chol 98 Stable BMP 4 months ago  Weight stable  EKG personally reviewed by myself on todays visit Shows normal sinus rhythm interventricular conduction delay no other significant ST-T wave changes  Past medical history reviewed Echocardiogram August 2018 Ejection fraction 20-25% diffuse hypokinesis severe hypokinesis anterior wall anteroseptal and apical region Mildly dilated aortic root  Cardiac MRI 2019 1. Normal LV size with septal-lateral dyssynchrony and mild diffuse hypokinesis, EF 43%. 2.  Normal RV size and systolic function. 3.  Moderate mitral regurgitation. 4.   Minimal coronary pattern LGE.  Seen by Dr. Caryl Comes and felt not be ICD candidate Chronic left bundle branch block  Other records reviewed with him Stress test: 09/05/15 Exercise myocardial perfusion imaging study with no significant  ischemia Fixed defect mid to apical anterior wall, apical septal region and apical region Anteroseptal and septal wall hypokinesis, EF estimated at 33%  Abnormal wall motion possibly secondary to left bundle branch block  Prior to the bypass surgery his legs felt heavy, had significant fatigue He had abnormal EKG leading to echocardiogram and stress test which led to catheterization and then bypass surgery  Outside Echo 2016: EF 30 Severe MR?, moderate TR, RVSP 47+ RA  Outside echo, Ejection fraction 25%  On 03/14/15 Moderate MR  History of Hodgkin's lymphoma, hypertension, left bundle branch block Quit smoking 21 yrs back ( 1/2 ppd x 5-6 yrs)  Catheterization report reviewed showing 85% left main disease, 60% RCA disease, 50% circumflex disease   PMH:   has a past medical history of Coronary artery disease, Family history of coronary artery disease (10/23/2011), HFrEF (heart failure with reduced ejection fraction) ([place]), Hodgkin's lymphoma ([place]) (1994), Hyperlipidemia (10/23/2011), Hypertension, Ischemic cardiomyopathy, Left main coronary artery disease (10/22/2011), 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  PSH:    Past Surgical History:  Procedure Laterality Date   CARDIAC CATHETERIZATION     CARPAL TUNNEL RELEASE     CORONARY ARTERY BYPASS GRAFT  10/24/2011   Procedure: CORONARY ARTERY BYPASS GRAFTING (CABG);  Surgeon: Rexene Alberts, MD;  Location: [facility];  Service: Open Heart Surgery;  Laterality: N/A;  Coronary Artery Bypass Grafting X 3  Using Left Internal  Mammary Artery, Right Internal Mammary Artery and Right Radial Artery   LYMPH NODE BIOPSY  1994   left neck   RADIAL ARTERY  HARVEST  10/24/2011   Procedure: RADIAL ARTERY HARVEST;  Surgeon: Rexene Alberts, MD;  Location[facility];  Service: Vascular;  Laterality: Left;    Current Outpatient Medications  Medication Sig Dispense Refill   allopurinol (ZYLOPRIM) 300 MG tablet Take 300 mg by mouth daily.     aspirin EC 81 MG tablet Take 81 mg by mouth daily.     carvedilol (COREG) 3.125 MG tablet Take 3.125 mg by mouth 2 (two) times daily.     colchicine (COLCRYS) 0.6 MG tablet Take by mouth daily as needed.      ezetimibe (ZETIA) 10 MG tablet Take 1 tablet (10 mg total) by mouth daily. 90 tablet 3   furosemide (LASIX) 40 MG tablet Take 1 tablet (40 mg total) by mouth daily as needed. 90 tablet 3   pantoprazole (PROTONIX) 40 MG tablet Take 40 mg by mouth daily.     potassium chloride (K-DUR) 10 MEQ tablet Take 1 tablet (10 mEq total) by mouth daily as needed. 90 tablet 3   rosuvastatin (CRESTOR) 40 MG tablet Take 1 tablet (40 mg total) by mouth daily. 90 tablet 3   sacubitril-valsartan (ENTRESTO) 24-26 MG Take 1 tablet by mouth 2 (two) times daily. 60 tablet 3   sennosides-docusate sodium (SENOKOT-S) 8.6-50 MG tablet Take 4 tablets by mouth daily.     vitamin B-12 (CYANOCOBALAMIN) 1000 MCG tablet Take 1 tablet (1,000 mcg total) by mouth daily. 30 tablet 1   No current facility-administered medications for this visit.    Allergies:   Spironolactone  Knots in breasts  Social History:  The patient  reports that he quit smoking about 22 years ago. He smoked 0.50 packs per day. He quit smokeless tobacco use about 5 months ago.  His smokeless tobacco use included snuff. He reports that he does not drink alcohol and does not use drugs.   Family History:   family history includes Coronary artery disease in an other family member; Heart attack in his father; Heart disease in his mother.    Review of Systems: Review of Systems  Constitutional: Negative.   HENT: Negative.   Respiratory: Negative.    Cardiovascular: Negative.   Gastrointestinal: Negative.   Musculoskeletal: Negative.   Neurological: Negative.   Psychiatric/Behavioral: Negative.   All other systems reviewed and are negative.   PHYSICAL EXAM: VS:  BP 122/80 (BP Location: Left Arm, Patient Position: Sitting, Cuff Size: Normal)    Pulse 78    Ht 5\' 8"  (1.727 m)    Wt 222 lb (100.7 kg)    SpO2 98%    BMI 33.75 kg/m  , BMI Body mass index is 33.75 kg/m.  Constitutional:  oriented to person, place, and time. No distress.  HENT:  Head: Grossly normal Eyes:  no discharge. No scleral icterus.  Neck: No JVD, no carotid bruits  Cardiovascular: Regular rate and rhythm, no murmurs appreciated Pulmonary/Chest: Clear to auscultation bilaterally, no wheezes or rails Abdominal: Soft.  no distension.  no tenderness.  Musculoskeletal: Normal range of motion Neurological:  normal muscle tone. Coordination normal. No atrophy Skin: Skin warm and dry Psychiatric: normal affect, pleasant   Recent Labs: 07/05/2019: BUN 12; Creatinine, Ser 1.19; Hemoglobin 14.9; Platelets 184; Potassium 4.7; Sodium 139    Lipid Panel Lab Results  Component Value Date   CHOL  245 (H) 10/23/2011   HDL 26 (L) 10/23/2011   LDLCALC 166 (H) 10/23/2011   TRIG 265 (H) 10/23/2011    Wt Readings from Last 3 Encounters:  09/26/19 222 lb (100.7 kg)  07/06/19 223 lb (101.2 kg)  09/21/18 223 lb (101.2 kg)     ASSESSMENT AND PLAN:  Left main coronary artery disease -   Continue current medication regimen. Denies anginal symptoms No further testing at this time  CAD with Stable angina Non-smoker, diabetes not an issue, cholesterol at goal  Shortness of breath Weight is stable Recommended regular exercise program He is interested in repeating echocardiogram in 6 months time, order placed to evaluate ejection fraction  S/P CABG x 3  MRI ejection fraction 43% Prior stress test no ischemia EF still low  S/P radiation therapy  previous  Hodgkin's lymphoma, radiation likely contributing to underlying coronary disease  Discussed this relationship again on today's visit  Hodgkin lymphoma, unspecified Hodgkin lymphoma type, unspecified body region [facility])  remote history in 1994   Congestive dilated cardiomyopathy ([place]) Ischemic cardiomyopathy ejection fraction 20-25% on echo Unable to tolerate spironolactone secondary to skin manifestations Unable to tolerate higher dose Entresto, stay on a 02/40  Chronic systolic CHF (congestive heart failure) ([facility]) Ejection fraction remains low, appears euvolemic Lasix renewed     Total encounter time more than 25 minutes  Greater than 50% was spent in counseling and coordination of care with the patient    Orders Placed This Encounter  Procedures   EKG 12-Lead     Signed, Esmond Plants, M.D., Ph.D. 09/26/2019  [place], [place]

## 2019-09-26 ENCOUNTER — Other Ambulatory Visit: Payer: Self-pay

## 2019-09-26 ENCOUNTER — Ambulatory Visit: Payer: 59 | Admitting: Cardiovascular Disease

## 2019-09-26 ENCOUNTER — Encounter: Payer: Self-pay | Admitting: Cardiovascular Disease

## 2019-09-26 VITALS — BP 122/80 | HR 78 | Ht 68.0 in | Wt 222.0 lb

## 2019-09-26 DIAGNOSIS — I951 Orthostatic hypotension: Secondary | ICD-10-CM

## 2019-09-26 DIAGNOSIS — I10 Essential (primary) hypertension: Secondary | ICD-10-CM

## 2019-09-26 DIAGNOSIS — E785 Hyperlipidemia, unspecified: Secondary | ICD-10-CM

## 2019-09-26 DIAGNOSIS — I5020 Unspecified systolic (congestive) heart failure: Secondary | ICD-10-CM

## 2019-09-26 DIAGNOSIS — I2510 Atherosclerotic heart disease of native coronary artery without angina pectoris: Secondary | ICD-10-CM

## 2019-09-26 DIAGNOSIS — Z951 Presence of aortocoronary bypass graft: Secondary | ICD-10-CM

## 2019-09-26 DIAGNOSIS — I255 Ischemic cardiomyopathy: Secondary | ICD-10-CM | POA: Diagnosis not present

## 2019-09-26 MED ORDER — CARVEDILOL 3.125 MG PO TABS
3.1250 mg | ORAL_TABLET | Freq: Two times a day (BID) | ORAL | 3 refills | Status: DC
Start: 2019-09-26 — End: 2020-06-27

## 2019-09-26 MED ORDER — ROSUVASTATIN CALCIUM 40 MG PO TABS
40.0000 mg | ORAL_TABLET | Freq: Every day | ORAL | 3 refills | Status: DC
Start: 2019-09-26 — End: 2021-04-12

## 2019-09-26 MED ORDER — FUROSEMIDE 40 MG PO TABS: 40.0000 mg | ORAL_TABLET | Freq: Every day | ORAL | 3 refills | Status: DC | PRN

## 2019-09-26 MED ORDER — POTASSIUM CHLORIDE CRYS ER 10 MEQ PO TBCR: 10.0000 meq | EXTENDED_RELEASE_TABLET | Freq: Every day | ORAL | 3 refills | Status: DC | PRN

## 2019-09-26 MED ORDER — ENTRESTO 24-26 MG PO TABS: 1.0000 | ORAL_TABLET | Freq: Two times a day (BID) | ORAL | 11 refills | Status: DC

## 2019-09-26 MED ORDER — EZETIMIBE 10 MG PO TABS
10.0000 mg | ORAL_TABLET | Freq: Every day | ORAL | 3 refills | Status: DC
Start: 2019-09-26 — End: 2020-06-27

## 2019-09-26 NOTE — Addendum Note (Signed)
Addended by: Valora Corporal on: 09/26/2019 10:54 AM   Modules accepted: Orders

## 2019-09-26 NOTE — Patient Instructions (Addendum)
Medication Instructions:  No changes  If you need a refill on your cardiac medications before your next appointment, please call your pharmacy.    Lab work: No new labs needed   If you have labs (blood work) drawn today and your tests are completely normal, you will receive your results only by: . MyChart Message (if you have MyChart) OR . A paper copy in the mail If you have any lab test that is abnormal or we need to change your treatment, we will call you to review the results.   Testing/Procedures: No new testing needed   Follow-Up: At [facility], you and your health needs are our priority.  As part of our continuing mission to provide you with exceptional heart care, we have created designated Provider Care Teams.  These Care Teams include your primary Cardiologist (physician) and Advanced Practice Providers (APPs -  Physician Assistants and Nurse Practitioners) who all work together to provide you with the care you need, when you need it.  . You will need a follow up appointment in 6 months  . Providers on your designated Care Team:   . Christopher Berge, NP . Ryan Dunn, PA-C . Jacquelyn Visser, PA-C  Any Other Special Instructions Will Be Listed Below (If Applicable).  COVID-19 Vaccine Information can be found at: [email or web address] For questions related to vaccine distribution or appointments, please email [email or web address] or call [phone].     

## 2019-12-27 ENCOUNTER — Other Ambulatory Visit (HOSPITAL_COMMUNITY): Payer: Self-pay | Admitting: Physician Assistant

## 2019-12-27 ENCOUNTER — Ambulatory Visit
Admission: RE | Admit: 2019-12-27 | Discharge: 2019-12-27 | Disposition: A | Discharge status: Home / Self Care | Payer: 59 | Source: Ambulatory Visit | Attending: Physician Assistant | Admitting: Physician Assistant

## 2019-12-27 ENCOUNTER — Other Ambulatory Visit: Payer: Self-pay | Admitting: Physician Assistant

## 2019-12-27 ENCOUNTER — Other Ambulatory Visit: Payer: Self-pay

## 2019-12-27 DIAGNOSIS — C8190 Hodgkin lymphoma, unspecified, unspecified site: Secondary | ICD-10-CM | POA: Insufficient documentation

## 2019-12-27 DIAGNOSIS — M549 Dorsalgia, unspecified: Secondary | ICD-10-CM | POA: Diagnosis present

## 2019-12-27 DIAGNOSIS — Z87442 Personal history of urinary calculi: Secondary | ICD-10-CM

## 2019-12-27 DIAGNOSIS — M545 Low back pain, unspecified: Secondary | ICD-10-CM

## 2019-12-27 DIAGNOSIS — Z789 Other specified health status: Secondary | ICD-10-CM | POA: Insufficient documentation

## 2019-12-27 DIAGNOSIS — R1032 Left lower quadrant pain: Secondary | ICD-10-CM | POA: Insufficient documentation

## 2020-02-24 ENCOUNTER — Telehealth: Payer: Self-pay | Admitting: *Deleted

## 2020-02-24 NOTE — Telephone Encounter (Signed)
PA Case: [id number], Status: Approved, Coverage Starts on: 02/24/2020 12:00:00 AM, Coverage Ends on: 02/23/2021 12:00:00 AM.

## 2020-03-23 NOTE — Progress Notes (Signed)
Cardiology Office Note  Date:  03/26/2020   ID:  Willie [name], DOB 1965-10-27, MRN [id number]  PCP:  Tracie Harrier, MD   Chief Complaint  Patient presents with  . Follow-up    6 month F/U-No new concerns    HPI:  55 year old gentleman with history of  Hodgkin's lymphoma with treatment in 1994, 1995 including chemotherapy and radiation , likely exacerbating coronary disease as detailed below smoking history for 20 years,  coronary artery disease, bypass surgery 3 with  LIMA,left radial, RIMA  October 2013, cardiomyopathy with ejection fraction 30% noted prior to bypass surgery  33% by stress test August 2017, no ischemia  hyperlipidemia  chronic systolic CHF Left bundle branch block noted prior to bypass surgery Mild bilateral carotid disease in 2013 who presents  for management of his ischemia cardiomyopathy.  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 Normal LV size with septal-lateral dyssynchrony and mild diffuse hypokinesis, EF 43%. 2.  Normal RV size and systolic function. 3.  Moderate mitral regurgitation. 4.  Minimal coronary pattern LGE.  Seen by Dr. Caryl Comes and felt not be ICD candidate  Other records reviewed with  him Stress test: 09/05/15 Exercise myocardial perfusion imaging study with no significant  ischemia Fixed defect mid to apical anterior wall, apical septal region and apical region Anteroseptal and septal wall hypokinesis, EF estimated at 33%  Abnormal wall motion possibly secondary to left bundle branch block  Prior to the bypass surgery his legs felt heavy, had significant fatigue He had abnormal EKG leading to echocardiogram and stress test which led to catheterization and then bypass surgery  Outside Echo 2016: EF 30 Severe MR?, moderate TR, RVSP 47+ RA  Outside echo, Ejection fraction 25%  On 03/14/15 Moderate MR  History of Hodgkin's lymphoma, hypertension, left bundle branch block Quit smoking 21 yrs back ( 1/2 ppd x 5-6 yrs)  Catheterization report reviewed showing 85% left main disease, 60% RCA disease, 50% circumflex disease   PMH:   has a past medical history of Coronary artery disease, Family history of coronary artery disease (10/23/2011), HFrEF (heart failure with reduced ejection fraction) ([place]), Hodgkin's lymphoma ([place]) (1994), Hyperlipidemia (10/23/2011), Hypertension, Ischemic cardiomyopathy, Left main coronary artery disease (10/22/2011), 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  PSH:    Past Surgical History:  Procedure Laterality Date  . CARDIAC CATHETERIZATION    . CARPAL TUNNEL RELEASE    . CORONARY ARTERY BYPASS GRAFT  10/24/2011   Procedure: CORONARY ARTERY BYPASS GRAFTING (CABG);  Surgeon: Rexene Alberts, MD;  Location: [facility];  Service: Open Heart Surgery;  Laterality: N/A;  Coronary Artery Bypass Grafting X 3 Using Left Internal  Mammary Artery,  Right Internal Mammary Artery and Right Radial Artery  . LYMPH NODE BIOPSY  1994   left neck  . RADIAL ARTERY HARVEST  10/24/2011   Procedure: RADIAL ARTERY HARVEST;  Surgeon: Rexene Alberts, MD;  Location: [facility];  Service: Vascular;  Laterality: Left;     Current Outpatient Medications  Medication Sig Dispense Refill  . allopurinol (ZYLOPRIM) 300 MG tablet Take 300 mg by mouth daily.    Marland Kitchen aspirin EC 81 MG tablet Take 81 mg by mouth daily.    . carvedilol (COREG) 3.125 MG tablet Take 1 tablet (3.125 mg total) by mouth 2 (two) times daily. 180 tablet 3  . colchicine 0.6 MG tablet Take by mouth daily as needed.     . ezetimibe (ZETIA) 10 MG tablet Take 1 tablet (10 mg total) by mouth daily. 90 tablet 3  . furosemide (LASIX) 40 MG tablet Take 1 tablet (40 mg total) by mouth daily as needed. 90 tablet 3  . pantoprazole (PROTONIX) 40 MG tablet Take 40 mg by mouth daily.    . potassium chloride (KLOR-CON) 10 MEQ tablet Take 1 tablet (10 mEq total) by mouth daily as needed (As needed with fluid pill Furosemide (Lasix)). 90 tablet 3  . rosuvastatin (CRESTOR) 40 MG tablet Take 1 tablet (40 mg total) by mouth daily. 90 tablet 3  . sacubitril-valsartan (ENTRESTO) 24-26 MG Take 1 tablet by mouth 2 (two) times daily. 60 tablet 11  . sennosides-docusate sodium (SENOKOT-S) 8.6-50 MG tablet Take 4 tablets by mouth daily.    . vitamin B-12 (CYANOCOBALAMIN) 1000 MCG tablet Take 1 tablet (1,000 mcg total) by mouth daily. 30 tablet 1   No current facility-administered medications for this visit.    Allergies:   Spironolactone  Knots in breasts  Social History:  The patient  reports that he quit smoking about 23 years ago. He smoked 0.50 packs per day. He quit smokeless tobacco use about a year ago.  His smokeless tobacco use included snuff. He reports that he does not drink alcohol and does not use drugs.   Family History:   family history includes Coronary artery disease in an other family member; Heart attack in his father; Heart disease in his mother.    Review of Systems: Review of Systems  Constitutional: Negative.   HENT: Negative.   Respiratory: Negative.   Cardiovascular: Negative.   Gastrointestinal: Negative.   Musculoskeletal: Negative.    Neurological: Negative.   Psychiatric/Behavioral: Negative.   All other systems reviewed and are negative.   PHYSICAL EXAM: VS:  BP 120/80 (BP Location: Left Arm, Patient Position: Sitting, Cuff Size: Large)   Pulse 66   Ht 5\' 8"  (1.727 m)   Wt 223 lb (101.2 kg)   SpO2 97%   BMI 33.91 kg/m  , BMI Body mass index is 33.91 kg/m.  Constitutional:  oriented to person, place, and time. No distress.  HENT:  Head: Grossly normal Eyes:  no discharge. No scleral icterus.  Neck: No JVD, no carotid bruits  Cardiovascular: Regular rate and rhythm, no murmurs appreciated Pulmonary/Chest: Clear to auscultation bilaterally, no wheezes or rails Abdominal: Soft.  no distension.  no tenderness.  Musculoskeletal: Normal range of motion Neurological:  normal muscle tone. Coordination normal. No atrophy Skin: Skin warm and dry Psychiatric: normal affect, pleasant  Recent Labs: 07/05/2019: BUN 12; Creatinine, Ser 1.19; Hemoglobin 14.9; Platelets 184; Potassium 4.7; Sodium 139    Lipid Panel Lab Results  Component Value Date   CHOL 245 (H)  10/23/2011   HDL 26 (L) 10/23/2011   LDLCALC 166 (H) 10/23/2011   TRIG 265 (H) 10/23/2011    Wt Readings from Last 3 Encounters:  03/26/20 223 lb (101.2 kg)  09/26/19 222 lb (100.7 kg)  07/06/19 223 lb (101.2 kg)     ASSESSMENT AND PLAN:  Left main coronary artery disease  Currently with no symptoms of angina. No further workup at this time. Continue current medication regimen. Stable shortness of breath on ladders on exertion  CAD with Stable angina Non-smoker, diabetes not an issue, cholesterol at goal Currently with no symptoms of angina. No further workup at this time. Continue current medication regimen.  Shortness of breath Weight is stable Recommended regular exercise program He is interested in repeating echocardiogram in 6 months time, order placed to evaluate ejection fraction  S/P CABG x 3  MRI ejection fraction 43% Prior stress  test no ischemia EF still low  S/P radiation therapy  previous Hodgkin's lymphoma, radiation likely contributing to underlying coronary disease   Hodgkin lymphoma, unspecified Hodgkin lymphoma type, unspecified body region [facility])  remote history in 1994   Congestive dilated cardiomyopathy ([place]) Ischemic cardiomyopathy ejection fraction 20-25% on echo Unable to tolerate spironolactone secondary to skin manifestations, breast tenderness Unable to tolerate higher dose Entresto, we will continue 24/26 We discussed Farxiga, he will read about it and call us back after he has had a chance to talk about it with his wife and research it, coupon card provided  Chronic systolic CHF (congestive heart failure) ([place]) Ejection fraction remains low,  Euvolemic, not using much Lasix     Total encounter time more than 25 minutes  Greater than 50% was spent in counseling and coordination of care with the patient    Orders Placed This Encounter  Procedures  . EKG 12-Lead     Signed, Esmond Plants, M.D., Ph.D. 03/26/2020  [place], [place]

## 2020-03-26 ENCOUNTER — Encounter: Payer: Self-pay | Admitting: Cardiovascular Disease

## 2020-03-26 ENCOUNTER — Ambulatory Visit (INDEPENDENT_AMBULATORY_CARE_PROVIDER_SITE_OTHER): Payer: BC Managed Care – PPO | Admitting: Cardiovascular Disease

## 2020-03-26 ENCOUNTER — Other Ambulatory Visit: Payer: Self-pay

## 2020-03-26 VITALS — BP 120/80 | HR 66 | Ht 68.0 in | Wt 223.0 lb

## 2020-03-26 DIAGNOSIS — E785 Hyperlipidemia, unspecified: Secondary | ICD-10-CM

## 2020-03-26 DIAGNOSIS — I10 Essential (primary) hypertension: Secondary | ICD-10-CM

## 2020-03-26 DIAGNOSIS — I2510 Atherosclerotic heart disease of native coronary artery without angina pectoris: Secondary | ICD-10-CM

## 2020-03-26 DIAGNOSIS — I255 Ischemic cardiomyopathy: Secondary | ICD-10-CM

## 2020-03-26 DIAGNOSIS — I5022 Chronic systolic (congestive) heart failure: Secondary | ICD-10-CM

## 2020-03-26 DIAGNOSIS — I5020 Unspecified systolic (congestive) heart failure: Secondary | ICD-10-CM

## 2020-03-26 DIAGNOSIS — I951 Orthostatic hypotension: Secondary | ICD-10-CM

## 2020-03-26 NOTE — Patient Instructions (Addendum)
Read about Willie [name] and Jardiance  Medication Instructions:  No changes  If you need a refill on your cardiac medications before your next appointment, please call your pharmacy.    Lab work: No new labs needed   If you have labs (blood work) drawn today and your tests are completely normal, you will receive your results only by: Marland Kitchen MyChart Message (if you have MyChart) OR . A paper copy in the mail If you have any lab test that is abnormal or we need to change your treatment, we will call you to review the results.   Testing/Procedures: No new testing needed   Follow-Up: At [facility], you and your health needs are our priority.  As part of our continuing mission to provide you with exceptional heart care, we have created designated Provider Care Teams.  These Care Teams include your primary Cardiologist (physician) and Advanced Practice Providers (APPs -  Physician Assistants and Nurse Practitioners) who all work together to provide you with the care you need, when you need it.  . You will need a follow up appointment in 12 months  . Providers on your designated Care Team:   . Murray Hodgkins, NP . Christell Faith, PA-C . Marrianne Mood, PA-C  Any Other Special Instructions Will Be Listed Below (If Applicable).  COVID-19 Vaccine Information can be found at: [email or web address].uk For questions related to vaccine distribution or appointments, please email [email or web address] or call [phone].

## 2020-03-28 ENCOUNTER — Other Ambulatory Visit: Payer: Self-pay

## 2020-03-28 MED ORDER — DAPAGLIFLOZIN PROPANEDIOL 10 MG PO TABS: 10.0000 mg | ORAL_TABLET | Freq: Every day | ORAL | 1 refills | Status: DC

## 2020-03-28 NOTE — Telephone Encounter (Signed)
Farxiga 10 mg daily

## 2020-03-30 NOTE — Telephone Encounter (Signed)
Can we make sure we have a prescription in for farxiga, There may be a coupon card free 30 days if we can check

## 2020-04-03 ENCOUNTER — Telehealth: Payer: Self-pay

## 2020-04-03 NOTE — Telephone Encounter (Signed)
Per fax received from [email or web address], Farxiga 10mg  tablets requires prior authorization. KEY: [id number] Clinical questions completed and sent to [employer].   PA Case ID: [id number] has been approved starting 04/03/20 through 04/03/2021.

## 2020-06-27 MED ORDER — EZETIMIBE 10 MG PO TABS: 10.0000 mg | ORAL_TABLET | Freq: Every day | ORAL | 1 refills | Status: DC

## 2020-06-27 MED ORDER — CARVEDILOL 3.125 MG PO TABS: 3.1250 mg | ORAL_TABLET | Freq: Two times a day (BID) | ORAL | 1 refills | Status: DC

## 2020-06-27 MED ORDER — ENTRESTO 24-26 MG PO TABS: 1.0000 | ORAL_TABLET | Freq: Two times a day (BID) | ORAL | 1 refills | Status: DC

## 2020-09-29 ENCOUNTER — Other Ambulatory Visit: Payer: Self-pay | Admitting: Cardiovascular Disease

## 2020-12-31 ENCOUNTER — Other Ambulatory Visit: Payer: Self-pay | Admitting: Cardiovascular Disease

## 2021-01-13 ENCOUNTER — Encounter: Payer: Self-pay | Admitting: Cardiovascular Disease

## 2021-02-01 ENCOUNTER — Ambulatory Visit (INDEPENDENT_AMBULATORY_CARE_PROVIDER_SITE_OTHER): Payer: Managed Care, Other (non HMO) | Admitting: Cardiovascular Disease

## 2021-02-01 ENCOUNTER — Encounter: Payer: Self-pay | Admitting: Cardiovascular Disease

## 2021-02-01 ENCOUNTER — Other Ambulatory Visit: Payer: Self-pay

## 2021-02-01 VITALS — BP 140/88 | HR 80 | Ht 68.0 in | Wt 221.2 lb

## 2021-02-01 DIAGNOSIS — R0602 Shortness of breath: Secondary | ICD-10-CM | POA: Diagnosis not present

## 2021-02-01 DIAGNOSIS — I5020 Unspecified systolic (congestive) heart failure: Secondary | ICD-10-CM

## 2021-02-01 DIAGNOSIS — I255 Ischemic cardiomyopathy: Secondary | ICD-10-CM | POA: Diagnosis not present

## 2021-02-01 DIAGNOSIS — I25118 Atherosclerotic heart disease of native coronary artery with other forms of angina pectoris: Secondary | ICD-10-CM | POA: Diagnosis not present

## 2021-02-01 DIAGNOSIS — U071 COVID-19: Secondary | ICD-10-CM

## 2021-02-01 DIAGNOSIS — I2510 Atherosclerotic heart disease of native coronary artery without angina pectoris: Secondary | ICD-10-CM

## 2021-02-01 NOTE — Progress Notes (Signed)
Cardiology Office Note  Date:  02/01/2021   ID:  Willie [name], DOB [birth year], MRN [id number]  PCP:  Tracie Harrier, MD   Chief Complaint  Patient presents with   Chest pain     Patient c/o while running around with his dogs today, had some chest pain/pressure, mid back pain and shortness of breath as well as having Covid on Jan. 6, 2023. Medications reviewed by the patient verbally.     HPI:  56 year old gentleman with history of  Hodgkin's lymphoma with treatment in 1994, 1995 including chemotherapy and radiation , likely exacerbating coronary disease  smoking history for 20 years,  coronary artery disease, bypass surgery 3 with  LIMA,left radial, RIMA  October 2013, cardiomyopathy with ejection fraction 30% noted prior to bypass surgery  33% by stress test August 2017, no ischemia  hyperlipidemia  chronic systolic CHF Left bundle branch block noted prior to bypass surgery Mild bilateral carotid disease in 2013 Ejection fraction 25 to 30% on echo April 2021 who presents  for management of his ischemia cardiomyopathy.  Last seen in clinic September 2021 On last clinic visit felt better on reduced dose of Entresto, on higher dose had orthostasis  COVID + January 04, 2020 In follow-up today reports getting Covid 01/11/2021 (probably started on 01/08/21) Chest congestion, slowly recovering  Chasing the dog several days ago, felt SOB, tachycardia He is uncertain if symptoms from [place] recovery or cardiac  BP at home on average 120/70 Denies chest pain on exertion, No lower extremity edema Reports he is no longer working, wonders if he should apply for disability Previously reported having shortness of breath when climbing up ladders and with oexertion Not on Lasix  Lab work reviewed Normal CBC HBA1c 5.8 Total chol 98, from  February 2021 Reports he ran out of his cholesterol medication for 6 months, lipid panel 2 months ago total cholesterol 205 LDL 143 Now back on his  statin  EKG personally reviewed by myself on todays visit Shows normal sinus rhythm left bundle branch block rate 80 bpm no other significant ST-T wave changes  Past medical history reviewed Echocardiogram 2021 Ejection fraction 20-25%   Cardiac MRI 2019 1. Normal LV size with septal-lateral dyssynchrony and mild diffuse hypokinesis, EF 43%. 2.  Normal RV size and systolic function. 3.  Moderate mitral regurgitation. 4.  Minimal coronary pattern LGE.  Seen by Dr. Caryl Comes and felt not be ICD candidate  Stress test: 09/05/15 Exercise myocardial perfusion imaging study with no significant  ischemia Fixed defect mid to apical anterior wall, apical septal region and apical region Anteroseptal and septal wall hypokinesis, EF estimated at 33%  Abnormal wall motion possibly secondary to left bundle branch block  Prior to the bypass surgery his legs felt heavy, had significant fatigue He had abnormal EKG leading to echocardiogram and stress test which led to catheterization and then bypass surgery  Outside Echo 2016: EF 30 Severe MR?, moderate TR, RVSP 47+ RA  Outside echo, Ejection fraction 25%  On 03/14/15 Moderate MR  History of Hodgkin's lymphoma, hypertension, left bundle branch block Quit smoking 21 yrs back ( 1/2 ppd x 5-6 yrs)  Catheterization report reviewed showing 85% left main disease, 60% RCA disease, 50% circumflex disease   PMH:   has a past medical history of Coronary artery disease, Family history of coronary artery disease (10/23/2011), HFrEF (heart failure with reduced ejection fraction) ([place]), Hodgkin's lymphoma ([place]) (1994), Hyperlipidemia (10/23/2011), Hypertension, Ischemic cardiomyopathy, Left main coronary artery disease (10/22/2011), 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  PSH:    Past Surgical History:  Procedure Laterality Date   CARDIAC CATHETERIZATION     CARPAL TUNNEL RELEASE     CORONARY  ARTERY BYPASS GRAFT  10/24/2011   Procedure: CORONARY ARTERY BYPASS GRAFTING (CABG);  Surgeon: Rexene Alberts, MD;  Location: [facility];  Service: Open Heart Surgery;  Laterality: N/A;  Coronary Artery Bypass Grafting X 3 Using Left Internal  Mammary Artery, Right Internal Mammary Artery and Right Radial Artery   LYMPH NODE BIOPSY  1994   left neck   RADIAL ARTERY HARVEST  10/24/2011   Procedure: RADIAL ARTERY HARVEST;  Surgeon: Rexene Alberts, MD;  Location: [facility];  Service: Vascular;  Laterality: Left;    Current Outpatient Medications  Medication Sig Dispense Refill   allopurinol (ZYLOPRIM) 300 MG tablet Take 300 mg by mouth daily.     aspirin EC 81 MG tablet Take 81 mg by mouth daily.     carvedilol (COREG) 3.125 MG tablet Take 1 tablet (3.125 mg total) by mouth 2 (two) times daily. 180 tablet 1   colchicine 0.6 MG tablet Take by mouth daily as needed.      ENTRESTO 24-26 MG TAKE ONE TABLET BY MOUTH TWICE A DAY 180 tablet 0   ezetimibe (ZETIA) 10 MG tablet TAKE ONE TABLET BY MOUTH ONE TIME DAILY 90 tablet 0   FARXIGA 10 MG TABS tablet TAKE ONE TABLET BY MOUTH EVERY MORNING BEFORE BREAKFAST 90 tablet 0   furosemide (LASIX) 40 MG tablet Take 1 tablet (40 mg total) by mouth daily as needed. 90 tablet 3   pantoprazole (PROTONIX) 40 MG tablet Take 40 mg by mouth daily.     potassium chloride (KLOR-CON) 10 MEQ tablet Take 1 tablet (10 mEq total) by mouth daily as needed (As needed with fluid pill Furosemide (Lasix)). 90 tablet 3   rosuvastatin (CRESTOR) 40 MG tablet Take 1 tablet (40 mg total) by mouth daily. 90 tablet 3   sennosides-docusate sodium (SENOKOT-S) 8.6-50 MG tablet Take 4 tablets by mouth daily.     vitamin B-12 (CYANOCOBALAMIN) 1000 MCG tablet Take 1 tablet (1,000 mcg total) by mouth daily. 30 tablet 1   No current facility-administered medications for this visit.    Allergies:   Spironolactone  Knots in breasts  Social History:  The patient  reports that he quit smoking about  24 years ago. His smoking use included cigarettes. He smoked an average of .5 packs per day. He quit smokeless tobacco use about 21 months ago.  His smokeless tobacco use included snuff. He reports that he does not drink alcohol and does not use drugs.   Family History:   family history includes Coronary artery disease in an other family member; Heart attack in his father; Heart disease in his mother.    Review of Systems: Review of Systems  Constitutional: Negative.   HENT: Negative.    Respiratory: Negative.    Cardiovascular: Negative.   Gastrointestinal: Negative.   Musculoskeletal: Negative.   Neurological: Negative.   Psychiatric/Behavioral: Negative.    All other systems reviewed and are negative.  PHYSICAL EXAM: VS:  BP 140/88 (BP Location: Left Arm, Patient Position: Sitting, Cuff Size: Normal)    Pulse 80    Ht 5\' 8"  (1.727 m)    Wt 221 lb 4 oz (100.4 kg)    SpO2 99%    BMI 33.64 kg/m  , BMI Body mass  index is 33.64 kg/m.  Constitutional:  oriented to person, place, and time. No distress.  HENT:  Head: Grossly normal Eyes:  no discharge. No scleral icterus.  Neck: No JVD, no carotid bruits  Cardiovascular: Regular rate and rhythm, no murmurs appreciated Pulmonary/Chest: Clear to auscultation bilaterally, no wheezes or rails Abdominal: Soft.  no distension.  no tenderness.  Musculoskeletal: Normal range of motion Neurological:  normal muscle tone. Coordination normal. No atrophy Skin: Skin warm and dry Psychiatric: normal affect, pleasant   Recent Labs: No results found for requested labs within last 8760 hours.    Lipid Panel Lab Results  Component Value Date   CHOL 245 (H) 10/23/2011   HDL 26 (L) 10/23/2011   LDLCALC 166 (H) 10/23/2011   TRIG 265 (H) 10/23/2011    Wt Readings from Last 3 Encounters:  02/01/21 221 lb 4 oz (100.4 kg)  03/26/20 223 lb (101.2 kg)  09/26/19 222 lb (100.7 kg)     ASSESSMENT AND PLAN:  Left main coronary artery disease  /coronary disease with stable angina Recent symptoms of shortness of breath and chest pain on exertion, chasing his dog Unable to exclude anginal symptoms or COVID recovery After discussion, we have ordered echocardiogram to update ejection fraction For any recurrent symptoms on exertion, recommend he call us, may need ischemic work-up Prior ejection fraction 25 to 30% 2 years ago  CAD with Stable angina Non-smoker, diabetes controlled, recently off his statin for 6 months when prescription ran out now back on his statin Plan as above, may need further work-up for any further chest pain on exertion  Shortness of breath/chronic systolic and diastolic CHF Shortness of breath symptoms on exertion, likely cardiac, also recovering from COVID  3 weeks ago Echo ordered as above, Recommend regular walking program as tolerated Reports no longer working.  Previously reported shortness of breath climbing ladders, work related  exertion causing symptoms May need to use Lasix as needed for any worsening shortness of breath  S/P CABG x 3 Echo to update ejection fraction as above  S/P radiation therapy/Hodgkin's lymphoma  previous Hodgkin's lymphoma, radiation likely contributing to underlying coronary disease   remote history in 1994   Congestive dilated cardiomyopathy ([place]) Ischemic cardiomyopathy ejection fraction 20-25% on echo 2 years ago, repeat echo to update Unable to tolerate spironolactone secondary to skin manifestations, breast tenderness Unable to tolerate higher dose Entresto, secondary to orthostasis He prefers not to increase carvedilol given concern for orthostasis Previously discussed SGLT2 inhibitors such as Farxiga/Jardiance, concerned about adding medications and potential intolerance    Total encounter time more than 25 minutes  Greater than 50% was spent in counseling and coordination of care with the patient    No orders of the defined types were placed in this  encounter.    Signed, Esmond Plants, M.D., Ph.D. 02/01/2021  [place], [place]

## 2021-02-01 NOTE — Patient Instructions (Addendum)
Echocardiogram for cardiomyopathy, shortness of breath, tachycardia  Medication Instructions:  No changes  If you need a refill on your cardiac medications before your next appointment, please call your pharmacy.   Lab work: No new labs needed  Testing/Procedures: -Your physician has requested that you have an echocardiogram. Echocardiography is a painless test that uses sound waves to create images of your heart. It provides your doctor with information about the size and shape of your heart and how well your hearts chambers and valves are working. This procedure takes approximately one hour. There are no restrictions for this procedure. There is a possibility that an IV may need to be started during your test to inject an image enhancing agent. This is done to obtain more optimal pictures of your heart. Therefore we ask that you do at least drink some water prior to coming in to hydrate your veins.    Follow-Up: At [facility], you and your health needs are our priority.  As part of our continuing mission to provide you with exceptional heart care, we have created designated Provider Care Teams.  These Care Teams include your primary Cardiologist (physician) and Advanced Practice Providers (APPs -  Physician Assistants and Nurse Practitioners) who all work together to provide you with the care you need, when you need it.  You will need a follow up appointment in 12 months  Providers on your designated Care Team:   Murray Hodgkins, NP Christell Faith, PA-C Cadence Kathlen Mody, [place]  COVID-19 Vaccine Information can be found at: ShippingScam.co.uk For questions related to vaccine distribution or appointments, please email vaccine@[email or web address] .com or call [phone].    Echocardiogram An echocardiogram is a test that uses sound waves (ultrasound) to produce images of the heart. Images from an echocardiogram can provide important  information about: Heart size and shape. The size and thickness and movement of your heart's walls. Heart muscle function and strength. Heart valve function or if you have stenosis. Stenosis is when the heart valves are too narrow. If blood is flowing backward through the heart valves (regurgitation). A tumor or infectious growth around the heart valves. Areas of heart muscle that are not working well because of poor blood flow or injury from a heart attack. Aneurysm detection. An aneurysm is a weak or damaged part of an artery wall. The wall bulges out from the normal force of blood pumping through the body. Tell a health care provider about: Any allergies you have. All medicines you are taking, including vitamins, herbs, eye drops, creams, and over-the-counter medicines. Any blood disorders you have. Any surgeries you have had. Any medical conditions you have. Whether you are pregnant or may be pregnant. What are the risks? Generally, this is a safe test. However, problems may occur, including an allergic reaction to dye (contrast) that may be used during the test. What happens before the test? No specific preparation is needed. You may eat and drink normally. What happens during the test?  You will take off your clothes from the waist up and put on a hospital gown. Electrodes or electrocardiogram (ECG)patches may be placed on your chest. The electrodes or patches are then connected to a device that monitors your heart rate and rhythm. You will lie down on a table for an ultrasound exam. A gel will be applied to your chest to help sound waves pass through your skin. A handheld device, called a transducer, will be pressed against your chest and moved over your heart. The transducer produces sound waves  that travel to your heart and bounce back (or "echo" back) to the transducer. These sound waves will be captured in real-time and changed into images of your heart that can be viewed on a  video monitor. The images will be recorded on a computer and reviewed by your health care provider. You may be asked to change positions or hold your breath for a short time. This makes it easier to get different views or better views of your heart. In some cases, you may receive contrast through an IV in one of your veins. This can improve the quality of the pictures from your heart. The procedure may vary among health care providers and hospitals. What can I expect after the test? You may return to your normal, everyday life, including diet, activities, and medicines, unless your health care provider tells you not to do that. Follow these instructions at home: It is up to you to get the results of your test. Ask your health care provider, or the department that is doing the test, when your results will be ready. Keep all follow-up visits. This is important. Summary An echocardiogram is a test that uses sound waves (ultrasound) to produce images of the heart. Images from an echocardiogram can provide important information about the size and shape of your heart, heart muscle function, heart valve function, and other possible heart problems. You do not need to do anything to prepare before this test. You may eat and drink normally. After the echocardiogram is completed, you may return to your normal, everyday life, unless your health care provider tells you not to do that. This information is not intended to replace advice given to you by your health care provider. Make sure you discuss any questions you have with your health care provider. Document Revised: 09/05/2020 Document Reviewed: 08/16/2019 Elsevier Patient Education  2022 [employer].

## 2021-02-08 ENCOUNTER — Encounter: Payer: Self-pay | Admitting: Cardiovascular Disease

## 2021-02-08 ENCOUNTER — Ambulatory Visit (INDEPENDENT_AMBULATORY_CARE_PROVIDER_SITE_OTHER): Payer: Managed Care, Other (non HMO)

## 2021-02-08 ENCOUNTER — Other Ambulatory Visit: Payer: Self-pay

## 2021-02-08 DIAGNOSIS — R0602 Shortness of breath: Secondary | ICD-10-CM | POA: Diagnosis not present

## 2021-02-08 DIAGNOSIS — I255 Ischemic cardiomyopathy: Secondary | ICD-10-CM

## 2021-02-08 LAB — ECHOCARDIOGRAM COMPLETE
AR max vel: 3.1 cm2
AV Area VTI: 3.13 cm2
AV Area mean vel: 2.86 cm2
AV Mean grad: 2 mmHg
AV Peak grad: 3.8 mmHg
Ao pk vel: 0.97 m/s
Area-P 1/2: 3.6 cm2
Calc EF: 29.5 %
S' Lateral: 4.7 cm
Single Plane A2C EF: 35.8 %
Single Plane A4C EF: 22.7 %

## 2021-02-11 ENCOUNTER — Ambulatory Visit (INDEPENDENT_AMBULATORY_CARE_PROVIDER_SITE_OTHER): Payer: Managed Care, Other (non HMO) | Admitting: Nurse Practitioner

## 2021-02-11 ENCOUNTER — Telehealth: Payer: Self-pay

## 2021-02-11 ENCOUNTER — Encounter: Payer: Self-pay | Admitting: Physician Assistant

## 2021-02-11 ENCOUNTER — Other Ambulatory Visit: Payer: Self-pay

## 2021-02-11 VITALS — BP 110/80 | HR 113 | Ht 68.0 in | Wt 220.4 lb

## 2021-02-11 DIAGNOSIS — I255 Ischemic cardiomyopathy: Secondary | ICD-10-CM

## 2021-02-11 DIAGNOSIS — I5020 Unspecified systolic (congestive) heart failure: Secondary | ICD-10-CM

## 2021-02-11 DIAGNOSIS — R0602 Shortness of breath: Secondary | ICD-10-CM

## 2021-02-11 DIAGNOSIS — I25118 Atherosclerotic heart disease of native coronary artery with other forms of angina pectoris: Secondary | ICD-10-CM | POA: Diagnosis not present

## 2021-02-11 NOTE — Progress Notes (Signed)
Cardiology Office Note:    Date:  02/11/2021   ID:  HAN VEJAR, DOB [birth year], MRN [id number]  PCP:  Tracie Harrier, MD   [facility] Providers Cardiologist:  Ida Rogue, MD     Referring MD: Tracie Harrier, MD   Chief Complaint: follow-up CHF, recent echo revealing worsening LVEF 20-25%  History of Present Illness:    Willie [name] is a 56 y.o. male with a hx of CAD s/p CABG x 3 10/13, cardiomyopathy with EF 30% noted prior to bypass surgery, chronic HFrEF, left BBB, carotid artery disease, Hodgkin's lymphoma treated in 1994-1995 with chemotherapy and radiation, and hyperlipidemia. He had no significant improvement in EF s/p revascularization.   Last cardiac cath 2013 revealed 85% stenosis left main, 20% stenosis LAD, 20% stenosis 1st diagonal, proximal LCx 20% stenosis, mid circumflex 50% stenosis, 1st obtuse marginal 85% stenosis, Ostial RCA 60%, proximal RCA 20%, mid RCA 20%, distal RCA 50%, Right dominant. Recommendation for consultation for CABG. He underwent CABG x 3 at [facility] 10/13, LIMA to LAD, RIMA to distal RCA, left radial artery to 2nd obtuse marginal branch of LCx.   Unable to tolerate spironolactone due to breast tenderness. He became orthostatic on higher dose Entresto. At time of evaluation with EP for ICD, his EF improved on MRI 2019.   He was last seen in our office on 02/01/21 by Dr. Rockey Situ for recent increase in SOB on exertion. He had COVID infection 3 weeks prior, questionable whether symptoms worsened by recent infection.  Echocardiogram revealed LVEF 20 to 25% previously 25-30% in 2021.    Today, he is here with his wife. He reports he continues to have significant dyspnea on exertion with limited physical activity. Notes increased heart rate with activity. Reports weight is stable and rarely takes Lasix since starting Farxiga. Stable 2 pillow orthopnea. Has orthostasis with sudden movement, worse when getting up during the night. He denies chest pain,  palpitations, weakness, fatigue, lower extremity edema, bleeding concerns, melena, presyncope, syncope, and PND.    Past Medical History:  Diagnosis Date   Coronary artery disease    a. 10/2011 Cath: LM 85, LAD 20p, D1 20, LCX 20/20m, OM1 85, RCA 60ost, 29m, 50d; b. 10/2011 CABG x 3: LIMA->LAD, RIMA->dRCA, L Radial->OM2; c. 08/2015 MV: EF 33%, apical ant, apical septal, apical infarct, no ischemia.   Family history of coronary artery disease 10/23/2011   HFrEF (heart failure with reduced ejection fraction) ([place])    a. 02/2017 cMRI: EF 43%, mod MR, minimal LGE, nl RV size/fxn; b. 04/2019 Echo: EF 25-30%, glob HK, gr2 DD, mod reduced RV fxn, triv MR/MR, Ao root 45mm.   Hodgkin's lymphoma ([place]) 1994   neck/midchest area with radiation   Hyperlipidemia 10/23/2011   Hypertension    Ischemic cardiomyopathy    a. 08/2016 Echo: EF 20-25%; b. 02/2017 cMRI: EF 43% (no indication for ICD based on this finding); c. 04/2019 Echo: EF 25-30%.   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   LIMA to LAD, RIMA to RCA, LRA to OM2   S/P radiation therapy 10/21/1992   Radiation therapy to chest and neck for Hodgkin's lymphoma   Unstable angina ([place]) 10/22/2011    Past Surgical History:  Procedure Laterality Date   CARDIAC CATHETERIZATION     CARPAL TUNNEL RELEASE     CORONARY ARTERY BYPASS GRAFT  10/24/2011   Procedure: CORONARY ARTERY BYPASS GRAFTING (CABG);  Surgeon: Rexene Alberts, MD;  Location:  [facility] OR;  Service: Open Heart Surgery;  Laterality: N/A;  Coronary Artery Bypass Grafting X 3 Using Left Internal  Mammary Artery, Right Internal Mammary Artery and Right Radial Artery   LYMPH NODE BIOPSY  1994   left neck   RADIAL ARTERY HARVEST  10/24/2011   Procedure: RADIAL ARTERY HARVEST;  Surgeon: Rexene Alberts, MD;  Location: [facility] OR;  Service: Vascular;  Laterality: Left;    Current Medications: Current Meds  Medication Sig   allopurinol (ZYLOPRIM) 300 MG tablet Take 300 mg by  mouth daily.   aspirin EC 81 MG tablet Take 81 mg by mouth daily.   carvedilol (COREG) 3.125 MG tablet Take 1 tablet (3.125 mg total) by mouth 2 (two) times daily.   colchicine 0.6 MG tablet Take by mouth daily as needed.    ENTRESTO 24-26 MG TAKE ONE TABLET BY MOUTH TWICE A DAY   ezetimibe (ZETIA) 10 MG tablet TAKE ONE TABLET BY MOUTH ONE TIME DAILY   FARXIGA 10 MG TABS tablet TAKE ONE TABLET BY MOUTH EVERY MORNING BEFORE BREAKFAST   furosemide (LASIX) 40 MG tablet Take 1 tablet (40 mg total) by mouth daily as needed.   pantoprazole (PROTONIX) 40 MG tablet Take 40 mg by mouth daily.   potassium chloride (KLOR-CON) 10 MEQ tablet Take 1 tablet (10 mEq total) by mouth daily as needed (As needed with fluid pill Furosemide (Lasix)).   rosuvastatin (CRESTOR) 40 MG tablet Take 1 tablet (40 mg total) by mouth daily.   sennosides-docusate sodium (SENOKOT-S) 8.6-50 MG tablet Take 4 tablets by mouth daily.   vitamin B-12 (CYANOCOBALAMIN) 1000 MCG tablet Take 1 tablet (1,000 mcg total) by mouth daily.     Allergies:   Spironolactone   Social History   Socioeconomic History   Marital status: Married    Spouse name: Not on file   Number of children: Not on file   Years of education: Not on file   Highest education level: Not on file  Occupational History   Not on file  Tobacco Use   Smoking status: Former    Packs/day: 0.50    Types: Cigarettes    Quit date: 10/21/1996    Years since quitting: 24.3   Smokeless tobacco: Former    Types: Snuff    Quit date: 04/05/2019  Vaping Use   Vaping Use: Never used  Substance and Sexual Activity   Alcohol use: No   Drug use: No   Sexual activity: Yes  Other Topics Concern   Not on file  Social History Narrative   Not on file   Social Determinants of Health   Financial Resource Strain: Not on file  Food Insecurity: Not on file  Transportation Needs: Not on file  Physical Activity: Not on file  Stress: Not on file  Social Connections: Not on  file     Family History: The patient's family history includes Coronary artery disease in an other family member; Heart attack in his father; Heart disease in his mother.  ROS:   Please see the history of present illness.    + shortness of breath with exertion All other systems reviewed and are negative.  Labs/Other Studies Reviewed:    The following studies were reviewed today:  Echo 02/08/21  Left Ventricle: Left ventricular ejection fraction, by estimation, is 20  to 25%. The left ventricle has severely decreased function. The left  ventricle demonstrates global hypokinesis. The average left ventricular  global longitudinal strain is -9.2 %.  The  global longitudinal strain is abnormal. The left ventricular internal  cavity size was mildly dilated. There is no left ventricular hypertrophy.  Left ventricular diastolic parameters are consistent with Grade II  diastolic dysfunction (pseudonormalization).  Right Ventricle: The right ventricular size is normal. No increase in  right ventricular wall thickness. Right ventricular systolic function is  normal. Tricuspid regurgitation signal is inadequate for assessing PA  pressure.  Left Atrium: Left atrial size was moderately dilated.  Right Atrium: Right atrial size was normal in size.  Pericardium: There is no evidence of pericardial effusion.  Mitral Valve: The mitral valve is normal in structure. Mild to moderate  mitral valve regurgitation. No evidence of mitral valve stenosis.  Tricuspid Valve: The tricuspid valve is normal in structure. Tricuspid  valve regurgitation is not demonstrated. No evidence of tricuspid  stenosis.  Aortic Valve: The aortic valve is tricuspid. Aortic valve regurgitation is  not visualized. No aortic stenosis is present. Aortic valve mean gradient  measures 2.0 mmHg. Aortic valve peak gradient measures 3.8 mmHg. Aortic  valve area, by VTI measures 3.13 cm.  Pulmonic Valve: The pulmonic valve was  normal in structure. Pulmonic valve  regurgitation is not visualized. No evidence of pulmonic stenosis.  Aorta: The aortic root is normal in size and structure. There is  borderline dilatation of the aortic root, measuring 39 mm.  Venous: The inferior vena cava is normal in size with greater than 50%  respiratory variability, suggesting right atrial pressure of 3 mmHg.  IAS/Shunts: No atrial level shunt detected by color flow Doppler.   Echo 4/21  Left Ventricle: Left ventricular ejection fraction, by estimation, is 25  to 30%. The left ventricle has severely decreased function. The left  ventricle demonstrates global hypokinesis. The left ventricular internal  cavity size was normal in size. There is no left ventricular hypertrophy. Left ventricular diastolic parameters are consistent with Grade II diastolic dysfunction (pseudonormalization).  Right Ventricle: The right ventricular size is normal. No increase in  right ventricular wall thickness. Right ventricular systolic function is  moderately reduced. There is normal pulmonary artery systolic pressure.  The tricuspid regurgitant velocity is 1.83 m/s, and with an assumed right atrial pressure of 3 mmHg, the estimated right ventricular systolic pressure is 27.0 mmHg.  Left Atrium: Left atrial size was not assessed.  Right Atrium: Right atrial size was normal in size.  Pericardium: There is no evidence of pericardial effusion.  Mitral Valve: The mitral valve is normal in structure. Trivial mitral  valve regurgitation.  Tricuspid Valve: The tricuspid valve is normal in structure. Tricuspid  valve regurgitation is not demonstrated.  Aortic Valve: The aortic valve is grossly normal. Aortic valve  regurgitation is not visualized. Aortic valve mean gradient measures 3.0  mmHg. Aortic valve peak gradient measures 5.4 mmHg. Aortic valve area, by  VTI measures 2.48 cm.  Pulmonic Valve: The pulmonic valve was normal in structure. Pulmonic  valve  regurgitation is not visualized.  Aorta: Aortic dilatation noted. There is mild dilatation of the aortic  root measuring 39 mm.  Venous: The inferior vena cava is normal in size with greater than 50%  respiratory variability, suggesting right atrial pressure of 3 mmHg.  IAS/Shunts: No atrial level shunt detected by color flow Doppler.   Cardiac MRI 2/19  1. Normal LV size with septal-lateral dyssynchrony and mild diffuse hypokinesis, EF 43%. 2.  Normal RV size and systolic function. 3.  Moderate mitral regurgitation. 4.  Minimal coronary pattern LGE.    Exercise  myoview 8/17  Exercise myocardial perfusion imaging study with no significant  ischemia Fixed region of mildly decreased perfusion of mild to moderate intensity in the mid to apical anterior wall, apical septal region and apical region Anteroseptal and septal wall hypokinesis, EF estimated at 33%  Abnormal wall motion possibly secondary to left bundle branch block Equivocal EKG changes concerning for ischemia at peak stress or in recovery. Unable to interpret EKG in the setting of left bundle Rare PVCs noted in recovery Target heart rate achieved, 7 METS, exercised for 5 minutes Low risk scan   Recent Labs: Care Everywhere 11/28/20 hgb 17.3, plt 210, Na+ 138, K+ 4.4, creat 1.1, AST 18, ALT 16 A1C 5.8, LDL 143, trigs 138, HDL 34.7  Recent Lipid Panel    Component Value Date/Time   CHOL 245 (H) 10/23/2011 0550   TRIG 265 (H) 10/23/2011 0550   HDL 26 (L) 10/23/2011 0550   CHOLHDL 9.4 10/23/2011 0550   VLDL 53 (H) 10/23/2011 0550   LDLCALC 166 (H) 10/23/2011 0550     Risk Assessment/Calculations:      Physical Exam:    VS:  BP 110/80 (BP Location: Left Arm, Patient Position: Sitting, Cuff Size: Normal)    Pulse (!) 113    Ht 5\' 8"  (1.727 m)    Wt 220 lb 6 oz (100 kg)    BMI 33.51 kg/m     Wt Readings from Last 3 Encounters:  02/11/21 220 lb 6 oz (100 kg)  02/01/21 221 lb 4 oz (100.4 kg)  03/26/20 223  lb (101.2 kg)     GEN:  Well nourished, well developed in no acute distress HEENT: Normal NECK: No JVD; No carotid bruits CARDIAC: RRR, no murmurs, rubs, gallops RESPIRATORY:  Clear to auscultation without rales, wheezing or rhonchi  ABDOMEN: Soft, non-tender, non-distended MUSCULOSKELETAL:  No edema; No deformity. 2+ radial pulses equal bilaterally SKIN: Warm and dry NEUROLOGIC:  Alert and oriented x 3 PSYCHIATRIC:  Normal affect   EKG:  EKG is ordered today.  The ekg ordered today demonstrates sinus tachycardia at 113 bpm with left BBB, no acute change from previous  Diagnoses:    1. HFrEF (heart failure with reduced ejection fraction) ([place])   2. Coronary artery disease of native artery of native heart with stable angina pectoris ([place])   3. Cardiomyopathy, ischemic   4. Shortness of breath    Assessment and Plan:     Chronic combined CHF/Dyspnea on exertion/Cardiomyopathy: Echo 02/08/21 LVEF: 20-25%, G2DD, global hypokinesis, decreased from 4/21 LVEF 25-30%. Appears euvolemic on exam. Has worsening shortness of breath and tachycardia with exertion since early January 2023. Also tested positive for COVID at that time.  He is on maximally tolerated GDMT with intolerance to spironolactone, higher dose Entresto and concerns for orthostatic hypotension with higher dose carvedilol.  Reports weight is stable, rarely taking Lasix since starting Farxiga. Right and left heart cath later this week. If ischemia evaluation is negative, will refer to EP for consideration of device. Will refer to [facility] to maximize GDMT.    CAD s/p CABG x 3 2013: He denies chest pain. Has worsening DOE and decreased LVEF by echo 2/23 in comparison to previous echo 4/21, DOE may be angina equivalent. Recommendation by Dr. Rockey Situ for further ischemia evaluation. Will plan for right and left heart cath. If ischemia is contributory, will maximize medical therapy prior to consideration of referral to  EP. Continue aspirin, carvedilol, statin, ezetimibe.   Hyperlipidemia LDL goal <  70: LDL 143 11/22, had run out of statin for about 6 months, now back on Zetia and Crestor.  Plan to recheck at next visit.  Left bundle branch block: Stable on EKG today. As noted above, will refer to EP for consideration of device in the setting of depressed LV function.   Disposition: 2-3 weeks after cath with Dr. Rockey Situ or APP   Shared Decision Making/Informed Consent The risks [stroke (1 in 1000), death (1 in 31), kidney failure [usually temporary] (1 in 500), bleeding (1 in 200), allergic reaction [possibly serious] (1 in 200)], benefits (diagnostic support and management of coronary artery disease) and alternatives of a cardiac catheterization were discussed in detail with Mr. Wenrick and he is willing to proceed.    Medication Adjustments/Labs and Tests Ordered: Current medicines are reviewed at length with the patient today.  Concerns regarding medicines are outlined above.  Orders Placed This Encounter  Procedures   CBC   Basic Metabolic Panel (BMET)   AMB referral to CHF clinic   EKG 12-Lead   No orders of the defined types were placed in this encounter.   Patient Instructions  Medication Instructions:  No changes at this time.  *If you need a refill on your cardiac medications before your next appointment, please call your pharmacy*   Lab Work: [place] today  If you have labs (blood work) drawn today and your tests are completely normal, you will receive your results only by: [place] (if you have MyChart) OR A paper copy in the mail If you have any lab test that is abnormal or we need to change your treatment, we will call you to review the results.   Testing/Procedures: [facility] Cardiac Cath Instructions  You are scheduled for a Cardiac Cath on: Friday February 10 th with Dr. Harrell Gave End Please arrive at 06:30 am on the day of your procedure Please expect a call  from our [facility] to pre-register you Do not eat/drink anything after midnight Someone will need to drive you home It is recommended someone be with you for the first 24 hours after your procedure Wear clothes that are easy to get on/off and wear slip on shoes if possible   Medications bring a current list of all medications with you  _XX__ You may take all of your other medications the morning of your procedure with enough water to swallow safely  _XX__ Do not take these medications before your procedure: Furosemide & Potassium the morning of your procedure.    Day of your procedure: Arrive at the [facility] entrance.  Free valet service is available.  After entering the [facility] please check-in at the registration desk (1st desk on your right) to receive your armband. After receiving your armband someone will escort you to the cardiac cath/special procedures waiting area.  The usual length of stay after your procedure is about 2 to 3 hours.  This can vary.  If you have any questions, please call our office at [phone], or you may call the cardiac cath lab at [facility] directly at [phone]    Follow-Up: At [facility], you and your health needs are our priority.  As part of our continuing mission to provide you with exceptional heart care, we have created designated Provider Care Teams.  These Care Teams include your primary Cardiologist (physician) and Advanced Practice Providers (APPs -  Physician Assistants and Nurse Practitioners) who all work together to provide you with the care you  need, when you need it.   Your next appointment:   2 week(s) post cath procedure  The format for your next appointment:   In Person  Provider:   Ida Rogue, MD or Christell Faith, PA-C    Other Instructions Referral to Heart Failure clinic in [place]. Their number is [phone].     Signed, Emmaline Life, NP  02/11/2021 2:50 PM    [facility]

## 2021-02-11 NOTE — H&P (View-Only) (Signed)
Cardiology Office Note:    Date:  02/11/2021   ID:  Willie [name], DOB [birth year], MRN [id number]  PCP:  Tracie Harrier, MD   [facility] Providers Cardiologist:  Ida Rogue, MD     Referring MD: Tracie Harrier, MD   Chief Complaint: follow-up CHF, recent echo revealing worsening LVEF 20-25%  History of Present Illness:    Willie [name] is a 56 y.o. male with a hx of CAD s/p CABG x 3 10/13, cardiomyopathy with EF 30% noted prior to bypass surgery, chronic HFrEF, left BBB, carotid artery disease, Hodgkin's lymphoma treated in 1994-1995 with chemotherapy and radiation, and hyperlipidemia. He had no significant improvement in EF s/p revascularization.   Last cardiac cath 2013 revealed 85% stenosis left main, 20% stenosis LAD, 20% stenosis 1st diagonal, proximal LCx 20% stenosis, mid circumflex 50% stenosis, 1st obtuse marginal 85% stenosis, Ostial RCA 60%, proximal RCA 20%, mid RCA 20%, distal RCA 50%, Right dominant. Recommendation for consultation for CABG. He underwent CABG x 3 at [facility] 10/13, LIMA to LAD, RIMA to distal RCA, left radial artery to 2nd obtuse marginal branch of LCx.   Unable to tolerate spironolactone due to breast tenderness. He became orthostatic on higher dose Entresto. At time of evaluation with EP for ICD, his EF improved on MRI 2019.   He was last seen in our office on 02/01/21 by Dr. Rockey Situ for recent increase in SOB on exertion. He had COVID infection 3 weeks prior, questionable whether symptoms worsened by recent infection.  Echocardiogram revealed LVEF 20 to 25% previously 25-30% in 2021.    Today, he is here with his wife. He reports he continues to have significant dyspnea on exertion with limited physical activity. Notes increased heart rate with activity. Reports weight is stable and rarely takes Lasix since starting Farxiga. Stable 2 pillow orthopnea. Has orthostasis with sudden movement, worse when getting up during the night. He denies chest pain,  palpitations, weakness, fatigue, lower extremity edema, bleeding concerns, melena, presyncope, syncope, and PND.    Past Medical History:  Diagnosis Date   Coronary artery disease    a. 10/2011 Cath: LM 85, LAD 20p, D1 20, LCX 20/54m, OM1 85, RCA 60ost, 64m, 50d; b. 10/2011 CABG x 3: LIMA->LAD, RIMA->dRCA, L Radial->OM2; c. 08/2015 MV: EF 33%, apical ant, apical septal, apical infarct, no ischemia.   Family history of coronary artery disease 10/23/2011   HFrEF (heart failure with reduced ejection fraction) ([place])    a. 02/2017 cMRI: EF 43%, mod MR, minimal LGE, nl RV size/fxn; b. 04/2019 Echo: EF 25-30%, glob HK, gr2 DD, mod reduced RV fxn, triv MR/MR, Ao root 52mm.   Hodgkin's lymphoma ([place]) 1994   neck/midchest area with radiation   Hyperlipidemia 10/23/2011   Hypertension    Ischemic cardiomyopathy    a. 08/2016 Echo: EF 20-25%; b. 02/2017 cMRI: EF 43% (no indication for ICD based on this finding); c. 04/2019 Echo: EF 25-30%.   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   LIMA to LAD, RIMA to RCA, LRA to OM2   S/P radiation therapy 10/21/1992   Radiation therapy to chest and neck for Hodgkin's lymphoma   Unstable angina ([place]) 10/22/2011    Past Surgical History:  Procedure Laterality Date   CARDIAC CATHETERIZATION     CARPAL TUNNEL RELEASE     CORONARY ARTERY BYPASS GRAFT  10/24/2011   Procedure: CORONARY ARTERY BYPASS GRAFTING (CABG);  Surgeon: Rexene Alberts, MD;  Location:  [place] OR;  Service: Open Heart Surgery;  Laterality: N/A;  Coronary Artery Bypass Grafting X 3 Using Left Internal  Mammary Artery, Right Internal Mammary Artery and Right Radial Artery   LYMPH NODE BIOPSY  1994   left neck   RADIAL ARTERY HARVEST  10/24/2011   Procedure: RADIAL ARTERY HARVEST;  Surgeon: Rexene Alberts, MD;  Location: [facility] OR;  Service: Vascular;  Laterality: Left;    Current Medications: Current Meds  Medication Sig   allopurinol (ZYLOPRIM) 300 MG tablet Take 300 mg by  mouth daily.   aspirin EC 81 MG tablet Take 81 mg by mouth daily.   carvedilol (COREG) 3.125 MG tablet Take 1 tablet (3.125 mg total) by mouth 2 (two) times daily.   colchicine 0.6 MG tablet Take by mouth daily as needed.    ENTRESTO 24-26 MG TAKE ONE TABLET BY MOUTH TWICE A DAY   ezetimibe (ZETIA) 10 MG tablet TAKE ONE TABLET BY MOUTH ONE TIME DAILY   FARXIGA 10 MG TABS tablet TAKE ONE TABLET BY MOUTH EVERY MORNING BEFORE BREAKFAST   furosemide (LASIX) 40 MG tablet Take 1 tablet (40 mg total) by mouth daily as needed.   pantoprazole (PROTONIX) 40 MG tablet Take 40 mg by mouth daily.   potassium chloride (KLOR-CON) 10 MEQ tablet Take 1 tablet (10 mEq total) by mouth daily as needed (As needed with fluid pill Furosemide (Lasix)).   rosuvastatin (CRESTOR) 40 MG tablet Take 1 tablet (40 mg total) by mouth daily.   sennosides-docusate sodium (SENOKOT-S) 8.6-50 MG tablet Take 4 tablets by mouth daily.   vitamin B-12 (CYANOCOBALAMIN) 1000 MCG tablet Take 1 tablet (1,000 mcg total) by mouth daily.     Allergies:   Spironolactone   Social History   Socioeconomic History   Marital status: Married    Spouse name: Not on file   Number of children: Not on file   Years of education: Not on file   Highest education level: Not on file  Occupational History   Not on file  Tobacco Use   Smoking status: Former    Packs/day: 0.50    Types: Cigarettes    Quit date: 10/21/1996    Years since quitting: 24.3   Smokeless tobacco: Former    Types: Snuff    Quit date: 04/05/2019  Vaping Use   Vaping Use: Never used  Substance and Sexual Activity   Alcohol use: No   Drug use: No   Sexual activity: Yes  Other Topics Concern   Not on file  Social History Narrative   Not on file   Social Determinants of Health   Financial Resource Strain: Not on file  Food Insecurity: Not on file  Transportation Needs: Not on file  Physical Activity: Not on file  Stress: Not on file  Social Connections: Not on  file     Family History: The patient's family history includes Coronary artery disease in an other family member; Heart attack in his father; Heart disease in his mother.  ROS:   Please see the history of present illness.    + shortness of breath with exertion All other systems reviewed and are negative.  Labs/Other Studies Reviewed:    The following studies were reviewed today:  Echo 02/08/21  Left Ventricle: Left ventricular ejection fraction, by estimation, is 20  to 25%. The left ventricle has severely decreased function. The left  ventricle demonstrates global hypokinesis. The average left ventricular  global longitudinal strain is -9.2 %.  The  global longitudinal strain is abnormal. The left ventricular internal  cavity size was mildly dilated. There is no left ventricular hypertrophy.  Left ventricular diastolic parameters are consistent with Grade II  diastolic dysfunction (pseudonormalization).  Right Ventricle: The right ventricular size is normal. No increase in  right ventricular wall thickness. Right ventricular systolic function is  normal. Tricuspid regurgitation signal is inadequate for assessing PA  pressure.  Left Atrium: Left atrial size was moderately dilated.  Right Atrium: Right atrial size was normal in size.  Pericardium: There is no evidence of pericardial effusion.  Mitral Valve: The mitral valve is normal in structure. Mild to moderate  mitral valve regurgitation. No evidence of mitral valve stenosis.  Tricuspid Valve: The tricuspid valve is normal in structure. Tricuspid  valve regurgitation is not demonstrated. No evidence of tricuspid  stenosis.  Aortic Valve: The aortic valve is tricuspid. Aortic valve regurgitation is  not visualized. No aortic stenosis is present. Aortic valve mean gradient  measures 2.0 mmHg. Aortic valve peak gradient measures 3.8 mmHg. Aortic  valve area, by VTI measures 3.13 cm.  Pulmonic Valve: The pulmonic valve was  normal in structure. Pulmonic valve  regurgitation is not visualized. No evidence of pulmonic stenosis.  Aorta: The aortic root is normal in size and structure. There is  borderline dilatation of the aortic root, measuring 39 mm.  Venous: The inferior vena cava is normal in size with greater than 50%  respiratory variability, suggesting right atrial pressure of 3 mmHg.  IAS/Shunts: No atrial level shunt detected by color flow Doppler.   Echo 4/21  Left Ventricle: Left ventricular ejection fraction, by estimation, is 25  to 30%. The left ventricle has severely decreased function. The left  ventricle demonstrates global hypokinesis. The left ventricular internal  cavity size was normal in size. There is no left ventricular hypertrophy. Left ventricular diastolic parameters are consistent with Grade II diastolic dysfunction (pseudonormalization).  Right Ventricle: The right ventricular size is normal. No increase in  right ventricular wall thickness. Right ventricular systolic function is  moderately reduced. There is normal pulmonary artery systolic pressure.  The tricuspid regurgitant velocity is 1.83 m/s, and with an assumed right atrial pressure of 3 mmHg, the estimated right ventricular systolic pressure is 26.8 mmHg.  Left Atrium: Left atrial size was not assessed.  Right Atrium: Right atrial size was normal in size.  Pericardium: There is no evidence of pericardial effusion.  Mitral Valve: The mitral valve is normal in structure. Trivial mitral  valve regurgitation.  Tricuspid Valve: The tricuspid valve is normal in structure. Tricuspid  valve regurgitation is not demonstrated.  Aortic Valve: The aortic valve is grossly normal. Aortic valve  regurgitation is not visualized. Aortic valve mean gradient measures 3.0  mmHg. Aortic valve peak gradient measures 5.4 mmHg. Aortic valve area, by  VTI measures 2.48 cm.  Pulmonic Valve: The pulmonic valve was normal in structure. Pulmonic  valve  regurgitation is not visualized.  Aorta: Aortic dilatation noted. There is mild dilatation of the aortic  root measuring 39 mm.  Venous: The inferior vena cava is normal in size with greater than 50%  respiratory variability, suggesting right atrial pressure of 3 mmHg.  IAS/Shunts: No atrial level shunt detected by color flow Doppler.   Cardiac MRI 2/19  1. Normal LV size with septal-lateral dyssynchrony and mild diffuse hypokinesis, EF 43%. 2.  Normal RV size and systolic function. 3.  Moderate mitral regurgitation. 4.  Minimal coronary pattern LGE.    Exercise  myoview 8/17  Exercise myocardial perfusion imaging study with no significant  ischemia Fixed region of mildly decreased perfusion of mild to moderate intensity in the mid to apical anterior wall, apical septal region and apical region Anteroseptal and septal wall hypokinesis, EF estimated at 33%  Abnormal wall motion possibly secondary to left bundle branch block Equivocal EKG changes concerning for ischemia at peak stress or in recovery. Unable to interpret EKG in the setting of left bundle Rare PVCs noted in recovery Target heart rate achieved, 7 METS, exercised for 5 minutes Low risk scan   Recent Labs: Care Everywhere 11/28/20 hgb 17.3, plt 210, Na+ 138, K+ 4.4, creat 1.1, AST 18, ALT 16 A1C 5.8, LDL 143, trigs 138, HDL 34.7  Recent Lipid Panel    Component Value Date/Time   CHOL 245 (H) 10/23/2011 0550   TRIG 265 (H) 10/23/2011 0550   HDL 26 (L) 10/23/2011 0550   CHOLHDL 9.4 10/23/2011 0550   VLDL 53 (H) 10/23/2011 0550   LDLCALC 166 (H) 10/23/2011 0550     Risk Assessment/Calculations:      Physical Exam:    VS:  BP 110/80 (BP Location: Left Arm, Patient Position: Sitting, Cuff Size: Normal)    Pulse (!) 113    Ht 5\' 8"  (1.727 m)    Wt 220 lb 6 oz (100 kg)    BMI 33.51 kg/m     Wt Readings from Last 3 Encounters:  02/11/21 220 lb 6 oz (100 kg)  02/01/21 221 lb 4 oz (100.4 kg)  03/26/20 223  lb (101.2 kg)     GEN:  Well nourished, well developed in no acute distress HEENT: Normal NECK: No JVD; No carotid bruits CARDIAC: RRR, no murmurs, rubs, gallops RESPIRATORY:  Clear to auscultation without rales, wheezing or rhonchi  ABDOMEN: Soft, non-tender, non-distended MUSCULOSKELETAL:  No edema; No deformity. 2+ radial pulses equal bilaterally SKIN: Warm and dry NEUROLOGIC:  Alert and oriented x 3 PSYCHIATRIC:  Normal affect   EKG:  EKG is ordered today.  The ekg ordered today demonstrates sinus tachycardia at 113 bpm with left BBB, no acute change from previous  Diagnoses:    1. HFrEF (heart failure with reduced ejection fraction) ([place])   2. Coronary artery disease of native artery of native heart with stable angina pectoris ([place])   3. Cardiomyopathy, ischemic   4. Shortness of breath    Assessment and Plan:     Chronic combined CHF/Dyspnea on exertion/Cardiomyopathy: Echo 02/08/21 LVEF: 20-25%, G2DD, global hypokinesis, decreased from 4/21 LVEF 25-30%. Appears euvolemic on exam. Has worsening shortness of breath and tachycardia with exertion since early January 2023. Also tested positive for COVID at that time.  He is on maximally tolerated GDMT with intolerance to spironolactone, higher dose Entresto and concerns for orthostatic hypotension with higher dose carvedilol.  Reports weight is stable, rarely taking Lasix since starting Farxiga. Right and left heart cath later this week. If ischemia evaluation is negative, will refer to EP for consideration of device. Will refer to [facility] to maximize GDMT.    CAD s/p CABG x 3 2013: He denies chest pain. Has worsening DOE and decreased LVEF by echo 2/23 in comparison to previous echo 4/21, DOE may be angina equivalent. Recommendation by Dr. Rockey Situ for further ischemia evaluation. Will plan for right and left heart cath. If ischemia is contributory, will maximize medical therapy prior to consideration of referral to  EP. Continue aspirin, carvedilol, statin, ezetimibe.   Hyperlipidemia LDL goal <  70: LDL 143 11/22, had run out of statin for about 6 months, now back on Zetia and Crestor.  Plan to recheck at next visit.  Left bundle branch block: Stable on EKG today. As noted above, will refer to EP for consideration of device in the setting of depressed LV function.   Disposition: 2-3 weeks after cath with Dr. Rockey Situ or APP   Shared Decision Making/Informed Consent The risks [stroke (1 in 1000), death (1 in 25), kidney failure [usually temporary] (1 in 500), bleeding (1 in 200), allergic reaction [possibly serious] (1 in 200)], benefits (diagnostic support and management of coronary artery disease) and alternatives of a cardiac catheterization were discussed in detail with Mr. Kyllonen and he is willing to proceed.    Medication Adjustments/Labs and Tests Ordered: Current medicines are reviewed at length with the patient today.  Concerns regarding medicines are outlined above.  Orders Placed This Encounter  Procedures   CBC   Basic Metabolic Panel (BMET)   AMB referral to CHF clinic   EKG 12-Lead   No orders of the defined types were placed in this encounter.   Patient Instructions  Medication Instructions:  No changes at this time.  *If you need a refill on your cardiac medications before your next appointment, please call your pharmacy*   Lab Work: [place] today  If you have labs (blood work) drawn today and your tests are completely normal, you will receive your results only by: [place] (if you have MyChart) OR A paper copy in the mail If you have any lab test that is abnormal or we need to change your treatment, we will call you to review the results.   Testing/Procedures: [facility] Cardiac Cath Instructions  You are scheduled for a Cardiac Cath on: Friday February 10 th with Dr. Harrell Gave End Please arrive at 06:30 am on the day of your procedure Please expect a call  from our [place] to pre-register you Do not eat/drink anything after midnight Someone will need to drive you home It is recommended someone be with you for the first 24 hours after your procedure Wear clothes that are easy to get on/off and wear slip on shoes if possible   Medications bring a current list of all medications with you  _XX__ You may take all of your other medications the morning of your procedure with enough water to swallow safely  _XX__ Do not take these medications before your procedure: Furosemide & Potassium the morning of your procedure.    Day of your procedure: Arrive at the [facility] entrance.  Free valet service is available.  After entering the [place] please check-in at the registration desk (1st desk on your right) to receive your armband. After receiving your armband someone will escort you to the cardiac cath/special procedures waiting area.  The usual length of stay after your procedure is about 2 to 3 hours.  This can vary.  If you have any questions, please call our office at [phone], or you may call the cardiac cath lab at [facility] directly at [phone]    Follow-Up: At [facility], you and your health needs are our priority.  As part of our continuing mission to provide you with exceptional heart care, we have created designated Provider Care Teams.  These Care Teams include your primary Cardiologist (physician) and Advanced Practice Providers (APPs -  Physician Assistants and Nurse Practitioners) who all work together to provide you with the care you  need, when you need it.   Your next appointment:   2 week(s) post cath procedure  The format for your next appointment:   In Person  Provider:   Ida Rogue, MD or Christell Faith, PA-C    Other Instructions Referral to Heart Failure clinic in [place]. Their number is [phone].     Signed, Emmaline Life, NP  02/11/2021 2:50 PM    [facility]

## 2021-02-11 NOTE — Telephone Encounter (Signed)
Able to reach pt regarding his recent ECHO, Dr. Rockey Situ had a chance to review his results and advised   "Echocardiogram  Ejection fraction previously 25 to 30%  Most recent study now 20-25  With make sure he calls [place] for any further chest pain or shortness of breath symptoms, might need to consider work-up for blockage with catheterization  --Medication options to assist the heart include increasing carvedilol as we discussed, adding Farxiga/Jardiance, spironolactone at low-dose, potentially could add low-dose digoxin (all to assist with pulm function)   Given the depressed ejection fraction we also should think about meeting with the advanced heart failure clinic in [place]. "  Mr. Low very thankful for the phone call of his results, would like an appt to discuss options for medication, he is okay with seeing APP or Dr. Rockey Situ, agrees to have referral placed for advance CHF clinic in [place]. all questions and concerns were address with nothing further at this time. Was able to add in today 2/6 at 2:20 pm with Christell Faith, PA-C

## 2021-02-11 NOTE — Patient Instructions (Addendum)
Medication Instructions:  No changes at this time.  *If you need a refill on your cardiac medications before your next appointment, please call your pharmacy*   Lab Work: [place] today  If you have labs (blood work) drawn today and your tests are completely normal, you will receive your results only by: [place] (if you have MyChart) OR A paper copy in the mail If you have any lab test that is abnormal or we need to change your treatment, we will call you to review the results.   Testing/Procedures: [facility] Cardiac Cath Instructions  You are scheduled for a Cardiac Cath on: Friday February 10 th with Dr. Harrell Gave End Please arrive at 06:30 am on the day of your procedure Please expect a call from our [place] to pre-register you Do not eat/drink anything after midnight Someone will need to drive you home It is recommended someone be with you for the first 24 hours after your procedure Wear clothes that are easy to get on/off and wear slip on shoes if possible   Medications bring a current list of all medications with you  _XX__ You may take all of your other medications the morning of your procedure with enough water to swallow safely  _XX__ Do not take these medications before your procedure: Furosemide & Potassium the morning of your procedure.    Day of your procedure: Arrive at the [facility] entrance.  Free valet service is available.  After entering the [place] please check-in at the registration desk (1st desk on your right) to receive your armband. After receiving your armband someone will escort you to the cardiac cath/special procedures waiting area.  The usual length of stay after your procedure is about 2 to 3 hours.  This can vary.  If you have any questions, please call our office at [phone], or you may call the cardiac cath lab at [facility] directly at [phone]    Follow-Up: At [facility], you and your health  needs are our priority.  As part of our continuing mission to provide you with exceptional heart care, we have created designated Provider Care Teams.  These Care Teams include your primary Cardiologist (physician) and Advanced Practice Providers (APPs -  Physician Assistants and Nurse Practitioners) who all work together to provide you with the care you need, when you need it.   Your next appointment:   2 week(s) post cath procedure  The format for your next appointment:   In Person  Provider:   Ida Rogue, MD or Christell Faith, PA-C    Other Instructions Referral to Heart Failure clinic in [place]. Their number is [phone].

## 2021-02-11 NOTE — Addendum Note (Signed)
Addended by: Emmaline Life on: 02/11/2021 04:40 PM   Modules accepted: Orders

## 2021-02-12 LAB — CBC
Hematocrit: 51.3 % — ABNORMAL HIGH (ref 37.5–51.0)
Hemoglobin: 17.8 g/dL — ABNORMAL HIGH (ref 13.0–17.7)
MCH: 30.2 pg (ref 26.6–33.0)
MCHC: 34.7 g/dL (ref 31.5–35.7)
MCV: 87 fL (ref 79–97)
Platelets: 225 10*3/uL (ref 150–450)
RBC: 5.89 x10E6/uL — ABNORMAL HIGH (ref 4.14–5.80)
RDW: 13.4 % (ref 11.6–15.4)
WBC: 9.5 10*3/uL (ref 3.4–10.8)

## 2021-02-12 LAB — BASIC METABOLIC PANEL
BUN/Creatinine Ratio: 11 (ref 9–20)
BUN: 13 mg/dL (ref 6–24)
CO2: 22 mmol/L (ref 20–29)
Calcium: 10.4 mg/dL — ABNORMAL HIGH (ref 8.7–10.2)
Chloride: 102 mmol/L (ref 96–106)
Creatinine, Ser: 1.15 mg/dL (ref 0.76–1.27)
Glucose: 95 mg/dL (ref 70–99)
Potassium: 4.9 mmol/L (ref 3.5–5.2)
Sodium: 141 mmol/L (ref 134–144)
eGFR: 74 mL/min/{1.73_m2} (ref >59)

## 2021-02-15 ENCOUNTER — Encounter: Payer: Self-pay | Admitting: Internal Medicine

## 2021-02-15 ENCOUNTER — Encounter
Admission: RE | Disposition: A | Discharge status: Home / Self Care | Payer: Self-pay | Source: Home / Self Care | Attending: Internal Medicine

## 2021-02-15 ENCOUNTER — Observation Stay
Admission: RE | Admit: 2021-02-15 | Discharge: 2021-02-16 | Disposition: A | Discharge status: Home / Self Care | Payer: Managed Care, Other (non HMO) | Attending: Internal Medicine | Admitting: Internal Medicine

## 2021-02-15 ENCOUNTER — Other Ambulatory Visit: Payer: Self-pay

## 2021-02-15 DIAGNOSIS — I25118 Atherosclerotic heart disease of native coronary artery with other forms of angina pectoris: Secondary | ICD-10-CM

## 2021-02-15 DIAGNOSIS — I5022 Chronic systolic (congestive) heart failure: Secondary | ICD-10-CM

## 2021-02-15 DIAGNOSIS — Z20822 Contact with and (suspected) exposure to covid-19: Secondary | ICD-10-CM | POA: Insufficient documentation

## 2021-02-15 DIAGNOSIS — Z951 Presence of aortocoronary bypass graft: Secondary | ICD-10-CM | POA: Insufficient documentation

## 2021-02-15 DIAGNOSIS — Z79899 Other long term (current) drug therapy: Secondary | ICD-10-CM | POA: Diagnosis not present

## 2021-02-15 DIAGNOSIS — I200 Unstable angina: Secondary | ICD-10-CM | POA: Diagnosis present

## 2021-02-15 DIAGNOSIS — Z8616 Personal history of COVID-19: Secondary | ICD-10-CM | POA: Insufficient documentation

## 2021-02-15 DIAGNOSIS — I255 Ischemic cardiomyopathy: Secondary | ICD-10-CM | POA: Diagnosis not present

## 2021-02-15 DIAGNOSIS — Z7982 Long term (current) use of aspirin: Secondary | ICD-10-CM | POA: Insufficient documentation

## 2021-02-15 DIAGNOSIS — E785 Hyperlipidemia, unspecified: Secondary | ICD-10-CM | POA: Diagnosis present

## 2021-02-15 DIAGNOSIS — R9430 Abnormal result of cardiovascular function study, unspecified: Secondary | ICD-10-CM

## 2021-02-15 DIAGNOSIS — I25810 Atherosclerosis of coronary artery bypass graft(s) without angina pectoris: Secondary | ICD-10-CM

## 2021-02-15 DIAGNOSIS — I10 Essential (primary) hypertension: Secondary | ICD-10-CM

## 2021-02-15 DIAGNOSIS — I110 Hypertensive heart disease with heart failure: Secondary | ICD-10-CM | POA: Insufficient documentation

## 2021-02-15 DIAGNOSIS — R0600 Dyspnea, unspecified: Secondary | ICD-10-CM

## 2021-02-15 DIAGNOSIS — I5020 Unspecified systolic (congestive) heart failure: Secondary | ICD-10-CM | POA: Diagnosis not present

## 2021-02-15 DIAGNOSIS — I25110 Atherosclerotic heart disease of native coronary artery with unstable angina pectoris: Principal | ICD-10-CM | POA: Insufficient documentation

## 2021-02-15 DIAGNOSIS — I2510 Atherosclerotic heart disease of native coronary artery without angina pectoris: Secondary | ICD-10-CM

## 2021-02-15 DIAGNOSIS — R0602 Shortness of breath: Secondary | ICD-10-CM

## 2021-02-15 HISTORY — PX: RIGHT/LEFT HEART CATH AND CORONARY/GRAFT ANGIOGRAPHY: CATH118267

## 2021-02-15 HISTORY — PX: CORONARY STENT INTERVENTION: CATH118234

## 2021-02-15 LAB — RESP PANEL BY RT-PCR (FLU A&B, COVID) ARPGX2
Influenza A by PCR: NEGATIVE
Influenza B by PCR: NEGATIVE
SARS Coronavirus 2 by RT PCR: NEGATIVE

## 2021-02-15 LAB — POCT ACTIVATED CLOTTING TIME
Activated Clotting Time: 263 seconds
Activated Clotting Time: 305 seconds
Activated Clotting Time: 263 seconds
Activated Clotting Time: 287 seconds

## 2021-02-15 SURGERY — RIGHT/LEFT HEART CATH AND CORONARY/GRAFT ANGIOGRAPHY
Anesthesia: Moderate Sedation

## 2021-02-15 MED ORDER — SENNA 8.6 MG PO TABS
4.0000 | ORAL_TABLET | Freq: Every day | ORAL | Status: DC
  Administered 2021-02-15: 34.4 mg via ORAL
  Filled 2021-02-15: qty 4

## 2021-02-15 MED ORDER — SODIUM CHLORIDE 0.9% FLUSH
3.0000 mL | Freq: Two times a day (BID) | INTRAVENOUS | Status: DC
  Administered 2021-02-15: 3 mL via INTRAVENOUS

## 2021-02-15 MED ORDER — ACETAMINOPHEN 325 MG PO TABS: 650.0000 mg | ORAL_TABLET | ORAL | Status: DC | PRN

## 2021-02-15 MED ORDER — SODIUM CHLORIDE 0.9% FLUSH: 3.0000 mL | INTRAVENOUS | Status: DC | PRN

## 2021-02-15 MED ORDER — ENOXAPARIN SODIUM 40 MG/0.4ML IJ SOSY: 40.0000 mg | PREFILLED_SYRINGE | INTRAMUSCULAR | Status: DC

## 2021-02-15 MED ORDER — ALLOPURINOL 300 MG PO TABS
300.0000 mg | ORAL_TABLET | Freq: Every morning | ORAL | Status: DC
  Administered 2021-02-16: 300 mg via ORAL
  Filled 2021-02-15: qty 1

## 2021-02-15 MED ORDER — SODIUM CHLORIDE 0.9 % IV SOLN
INTRAVENOUS | Status: AC | PRN
  Administered 2021-02-15: 10 mL/h via INTRAVENOUS

## 2021-02-15 MED ORDER — SODIUM CHLORIDE 0.9 % IV SOLN: 250.0000 mL | INTRAVENOUS | Status: DC | PRN

## 2021-02-15 MED ORDER — MIDAZOLAM HCL 2 MG/2ML IJ SOLN
INTRAMUSCULAR | Status: AC
  Filled 2021-02-15: qty 2

## 2021-02-15 MED ORDER — CLOPIDOGREL BISULFATE 75 MG PO TABS
75.0000 mg | ORAL_TABLET | Freq: Every day | ORAL | Status: DC
  Administered 2021-02-16: 75 mg via ORAL
  Filled 2021-02-15: qty 1

## 2021-02-15 MED ORDER — CLOPIDOGREL BISULFATE 75 MG PO TABS
ORAL_TABLET | ORAL | Status: AC
  Filled 2021-02-15: qty 8

## 2021-02-15 MED ORDER — HYDRALAZINE HCL 20 MG/ML IJ SOLN: 10.0000 mg | INTRAMUSCULAR | Status: AC | PRN

## 2021-02-15 MED ORDER — HEPARIN SODIUM (PORCINE) 1000 UNIT/ML IJ SOLN
INTRAMUSCULAR | Status: AC
  Filled 2021-02-15: qty 10

## 2021-02-15 MED ORDER — ASPIRIN 81 MG PO CHEW
CHEWABLE_TABLET | ORAL | Status: AC
  Filled 2021-02-15: qty 1

## 2021-02-15 MED ORDER — PANTOPRAZOLE SODIUM 40 MG PO TBEC
40.0000 mg | DELAYED_RELEASE_TABLET | Freq: Every morning | ORAL | Status: DC
  Administered 2021-02-16: 40 mg via ORAL
  Filled 2021-02-15: qty 1

## 2021-02-15 MED ORDER — FENTANYL CITRATE (PF) 100 MCG/2ML IJ SOLN
INTRAMUSCULAR | Status: AC
  Filled 2021-02-15: qty 2

## 2021-02-15 MED ORDER — CLOPIDOGREL BISULFATE 75 MG PO TABS
ORAL_TABLET | ORAL | Status: DC | PRN
  Administered 2021-02-15: 600 mg via ORAL

## 2021-02-15 MED ORDER — SODIUM CHLORIDE 0.9 % IV SOLN: INTRAVENOUS | Status: DC

## 2021-02-15 MED ORDER — ONDANSETRON HCL 4 MG/2ML IJ SOLN: 4.0000 mg | Freq: Four times a day (QID) | INTRAMUSCULAR | Status: DC | PRN

## 2021-02-15 MED ORDER — IOHEXOL 300 MG/ML  SOLN
INTRAMUSCULAR | Status: DC | PRN
  Administered 2021-02-15: 180 mL

## 2021-02-15 MED ORDER — HEPARIN SODIUM (PORCINE) 1000 UNIT/ML IJ SOLN
INTRAMUSCULAR | Status: DC | PRN
  Administered 2021-02-15: 9000 [IU] via INTRAVENOUS
  Administered 2021-02-15 (×2): 2000 [IU] via INTRAVENOUS

## 2021-02-15 MED ORDER — ROSUVASTATIN CALCIUM 20 MG PO TABS
40.0000 mg | ORAL_TABLET | Freq: Every day | ORAL | Status: DC
  Filled 2021-02-15: qty 2

## 2021-02-15 MED ORDER — SACUBITRIL-VALSARTAN 24-26 MG PO TABS
1.0000 | ORAL_TABLET | Freq: Two times a day (BID) | ORAL | Status: DC
  Administered 2021-02-15 – 2021-02-16 (×2): 1 via ORAL
  Filled 2021-02-15 (×2): qty 1

## 2021-02-15 MED ORDER — HEPARIN (PORCINE) IN NACL 1000-0.9 UT/500ML-% IV SOLN
INTRAVENOUS | Status: DC | PRN
  Administered 2021-02-15 (×2): 500 mL

## 2021-02-15 MED ORDER — HEPARIN (PORCINE) IN NACL 1000-0.9 UT/500ML-% IV SOLN
INTRAVENOUS | Status: AC
  Filled 2021-02-15: qty 1000

## 2021-02-15 MED ORDER — MIDAZOLAM HCL 2 MG/2ML IJ SOLN
INTRAMUSCULAR | Status: DC | PRN
  Administered 2021-02-15 (×3): 1 mg via INTRAVENOUS

## 2021-02-15 MED ORDER — FENTANYL CITRATE (PF) 100 MCG/2ML IJ SOLN
INTRAMUSCULAR | Status: DC | PRN
  Administered 2021-02-15 (×3): 25 ug via INTRAVENOUS

## 2021-02-15 MED ORDER — CARVEDILOL 3.125 MG PO TABS
3.1250 mg | ORAL_TABLET | Freq: Two times a day (BID) | ORAL | Status: DC
  Administered 2021-02-15 – 2021-02-16 (×2): 3.125 mg via ORAL
  Filled 2021-02-15 (×2): qty 1

## 2021-02-15 MED ORDER — EZETIMIBE 10 MG PO TABS
10.0000 mg | ORAL_TABLET | Freq: Every day | ORAL | Status: DC
  Administered 2021-02-16: 10 mg via ORAL
  Filled 2021-02-15: qty 1

## 2021-02-15 MED ORDER — NITROGLYCERIN 1 MG/10 ML FOR IR/CATH LAB
INTRA_ARTERIAL | Status: DC | PRN
  Administered 2021-02-15 (×2): 200 ug via INTRACORONARY

## 2021-02-15 MED ORDER — ASPIRIN EC 81 MG PO TBEC: 81.0000 mg | DELAYED_RELEASE_TABLET | Freq: Every evening | ORAL | Status: DC

## 2021-02-15 MED ORDER — LABETALOL HCL 5 MG/ML IV SOLN: 10.0000 mg | INTRAVENOUS | Status: AC | PRN

## 2021-02-15 MED ORDER — ASPIRIN 81 MG PO CHEW
81.0000 mg | CHEWABLE_TABLET | ORAL | Status: AC
  Administered 2021-02-15: 81 mg via ORAL

## 2021-02-15 MED ORDER — DAPAGLIFLOZIN PROPANEDIOL 10 MG PO TABS
10.0000 mg | ORAL_TABLET | Freq: Every day | ORAL | Status: DC
  Filled 2021-02-15: qty 1

## 2021-02-15 MED ORDER — SODIUM CHLORIDE 0.9% FLUSH: 3.0000 mL | Freq: Two times a day (BID) | INTRAVENOUS | Status: DC

## 2021-02-15 SURGICAL SUPPLY — 26 items
BALLN SCOREFLEX 2.75X15 (BALLOONS) ×2
BALLN TREK RX 2.5X15 (BALLOONS) ×2
BALLN ~~LOC~~ TREK RX 3.5X20 (BALLOONS) ×2
BALLOON SCOREFLEX 2.75X15 (BALLOONS) IMPLANT
BALLOON TREK RX 2.5X15 (BALLOONS) IMPLANT
BALLOON ~~LOC~~ TREK RX 3.5X20 (BALLOONS) IMPLANT
CANNULA 5F STIFF (CANNULA) ×1 IMPLANT
CATH INFINITI 5 FR IM (CATHETERS) ×1 IMPLANT
CATH INFINITI 5FR JL4 (CATHETERS) ×1 IMPLANT
CATH LAUNCHER 6FR AL.75 (CATHETERS) ×1 IMPLANT
CATH SWAN GANZ 7F STRAIGHT (CATHETERS) ×1 IMPLANT
DEVICE CLOSURE MYNXGRIP 6/7F (Vascular Products) ×1 IMPLANT
KIT ENCORE 26 ADVANTAGE (KITS) ×1 IMPLANT
PACK CARDIAC CATH (CUSTOM PROCEDURE TRAY) ×2 IMPLANT
PAD ELECT DEFIB RADIOL ZOLL (MISCELLANEOUS) ×1 IMPLANT
PROTECTION STATION PRESSURIZED (MISCELLANEOUS) ×2
SET ATX SIMPLICITY (MISCELLANEOUS) ×1 IMPLANT
SHEATH AVANTI 5FR X 11CM (SHEATH) ×1 IMPLANT
SHEATH AVANTI 6FR X 11CM (SHEATH) ×2 IMPLANT
SHEATH PINNACLE 7F 10CM (SHEATH) ×1 IMPLANT
STATION PROTECTION PRESSURIZED (MISCELLANEOUS) IMPLANT
STENT ONYX FRONTIER 3.0X38 (Permanent Stent) ×1 IMPLANT
STENT ONYX FRONTIER 3.5X15 (Permanent Stent) ×1 IMPLANT
WIRE GUIDERIGHT .035X150 (WIRE) ×1 IMPLANT
WIRE HITORQ VERSACORE ST 145CM (WIRE) ×1 IMPLANT
WIRE RUNTHROUGH .014X180CM (WIRE) ×1 IMPLANT

## 2021-02-15 NOTE — Interval H&P Note (Signed)
History and Physical Interval Note:  02/15/2021 7:48 AM  Willie [name]  has presented today for surgery, with the diagnosis of chronic HFrEF.  The various methods of treatment have been discussed with the patient and family. After consideration of risks, benefits and other options for treatment, the patient has consented to  Procedure(s): RIGHT/LEFT HEART CATH AND CORONARY/GRAFT ANGIOGRAPHY (N/A) as a surgical intervention.  The patient's history has been reviewed, patient examined, no change in status, stable for surgery.  I have reviewed the patient's chart and labs.  Questions were answered to the patient's satisfaction.    Cath Lab Visit (complete for each Cath Lab visit)  Clinical Evaluation Leading to the Procedure:   ACS: No.  Non-ACS:    Anginal/Heart Failure Classification: CCS class III  Anti-ischemic medical therapy: Minimal Therapy (1 class of medications)  Non-Invasive Test Results: No non-invasive testing performed (LVEF 20-25% by echo - high risk)  Prior CABG: No previous CABG  Alaa Eyerman

## 2021-02-15 NOTE — Brief Op Note (Signed)
BRIEF CARDIAC CATHETERIZATION NOTE  DATE: 02/15/2021  TIME: 10:05 AM  PATIENT:  Willie [name]  56 y.o. male  PRE-OPERATIVE DIAGNOSIS:  Chronic HFrEF and ischemic cardiomyopathy  POST-OPERATIVE DIAGNOSIS:  Same  PROCEDURE:  Procedure(s): RIGHT/LEFT HEART CATH AND CORONARY/GRAFT ANGIOGRAPHY (N/A) CORONARY STENT INTERVENTION (N/A)  SURGEON:  Surgeon(s) and Role:    * Mistie Adney, MD - Primary  FINDINGS: Severe native CAD including 90% distal LMCA, 50% ostial RCA, and 90% mid RCA lesions. Widely patent LIMA-LAD and free radial-OM. Occluded RIMA-rPDA. Mildly elevated left heart filling pressure. Severely elevated right heart filling pressure. Successful PCI to proximal/mid RCA using overlapping Onyx 3.5 x 15 mm and 3.0 x 38 mm drug-eluting stents with 0% residual stenosis and TIMI-3 flow.  RECOMMENDATIONS: Overnight recovery. DAPT with aspirin and clopidogrel for at least 6 months. If renal function stable, consider escalation of diuresis tomorrow (sooner if dyspnea develops). Escalate GDMT as tolerated. Aggressive secondary prevention of coronary artery disease.  Nelva Bush, MD [facility]

## 2021-02-16 DIAGNOSIS — I200 Unstable angina: Secondary | ICD-10-CM | POA: Diagnosis not present

## 2021-02-16 DIAGNOSIS — I2510 Atherosclerotic heart disease of native coronary artery without angina pectoris: Secondary | ICD-10-CM

## 2021-02-16 DIAGNOSIS — E7800 Pure hypercholesterolemia, unspecified: Secondary | ICD-10-CM

## 2021-02-16 DIAGNOSIS — I25110 Atherosclerotic heart disease of native coronary artery with unstable angina pectoris: Secondary | ICD-10-CM | POA: Diagnosis not present

## 2021-02-16 DIAGNOSIS — I5022 Chronic systolic (congestive) heart failure: Secondary | ICD-10-CM | POA: Diagnosis not present

## 2021-02-16 DIAGNOSIS — I255 Ischemic cardiomyopathy: Secondary | ICD-10-CM | POA: Diagnosis not present

## 2021-02-16 DIAGNOSIS — I10 Essential (primary) hypertension: Secondary | ICD-10-CM

## 2021-02-16 LAB — BASIC METABOLIC PANEL
Anion gap: 8 (ref 5–15)
BUN: 16 mg/dL (ref 6–20)
CO2: 24 mmol/L (ref 22–32)
Calcium: 9 mg/dL (ref 8.9–10.3)
Chloride: 103 mmol/L (ref 98–111)
Creatinine, Ser: 1.15 mg/dL (ref 0.61–1.24)
GFR, Estimated: >60 mL/min (ref >60)
Glucose, Bld: 102 mg/dL — ABNORMAL HIGH (ref 70–99)
Potassium: 3.8 mmol/L (ref 3.5–5.1)
Sodium: 135 mmol/L (ref 135–145)

## 2021-02-16 LAB — CBC
HCT: 46.2 % (ref 39.0–52.0)
Hemoglobin: 15.3 g/dL (ref 13.0–17.0)
MCH: 29.8 pg (ref 26.0–34.0)
MCHC: 33.1 g/dL (ref 30.0–36.0)
MCV: 89.9 fL (ref 80.0–100.0)
Platelets: 208 10*3/uL (ref 150–400)
RBC: 5.14 MIL/uL (ref 4.22–5.81)
RDW: 13.2 % (ref 11.5–15.5)
WBC: 9.3 10*3/uL (ref 4.0–10.5)
nRBC: 0 % (ref 0.0–0.2)

## 2021-02-16 MED ORDER — ALUM & MAG HYDROXIDE-SIMETH 200-200-20 MG/5ML PO SUSP
15.0000 mL | Freq: Once | ORAL | Status: AC
  Administered 2021-02-16: 15 mL via ORAL
  Filled 2021-02-16: qty 30

## 2021-02-16 MED ORDER — POTASSIUM CHLORIDE CRYS ER 10 MEQ PO TBCR: 10.0000 meq | EXTENDED_RELEASE_TABLET | Freq: Every day | ORAL | 3 refills | Status: DC

## 2021-02-16 MED ORDER — CLOPIDOGREL BISULFATE 75 MG PO TABS: 75.0000 mg | ORAL_TABLET | Freq: Every day | ORAL | 3 refills | Status: DC

## 2021-02-16 MED ORDER — FUROSEMIDE 40 MG PO TABS: 40.0000 mg | ORAL_TABLET | Freq: Every day | ORAL | 3 refills | Status: DC

## 2021-02-16 NOTE — Progress Notes (Signed)
Progress Note  Patient Name: Willie [name] Date of Encounter: 02/16/2021  [facility] Cardiologist: Ida Rogue, MD   Subjective   Feeling well.  Ambulated without chest pain.   Inpatient Medications    Scheduled Meds:  allopurinol  300 mg Oral q AM   aspirin EC  81 mg Oral QPM   carvedilol  3.125 mg Oral BID WC   clopidogrel  75 mg Oral Q breakfast   dapagliflozin propanediol  10 mg Oral Q breakfast   enoxaparin (LOVENOX) injection  40 mg Subcutaneous Q24H   ezetimibe  10 mg Oral Daily   pantoprazole  40 mg Oral q AM   rosuvastatin  40 mg Oral Daily   sacubitril-valsartan  1 tablet Oral BID   senna  4 tablet Oral QHS   sodium chloride flush  3 mL Intravenous Q12H   Continuous Infusions:  sodium chloride     PRN Meds: sodium chloride, acetaminophen, ondansetron (ZOFRAN) IV, sodium chloride flush   Vital Signs    Vitals:   02/15/21 2016 02/15/21 2330 02/16/21 0400 02/16/21 0815  BP: 113/68 107/64 103/62 127/76  Pulse: 66 66 67 67  Resp: 16 16 16 20   Temp: 97.7 F (36.5 C) 98 F (36.7 C) 97.8 F (36.6 C) 98.3 F (36.8 C)  TempSrc:   Oral   SpO2: 95% 96% 97% 96%  Weight:      Height:        Intake/Output Summary (Last 24 hours) at 02/16/2021 0839 Last data filed at 02/15/2021 1847 Gross per 24 hour  Intake 480 ml  Output 750 ml  Net -270 ml   Last 3 Weights 02/15/2021 02/11/2021 02/01/2021  Weight (lbs) 214 lb 220 lb 6 oz 221 lb 4 oz  Weight (kg) 97.07 kg 99.961 kg 100.358 kg      Telemetry    Sinus rhythm. 5 beats NSVT. - Personally Reviewed  ECG    N/a - Personally Reviewed  Physical Exam   GEN: No acute distress.   Neck: No JVD Cardiac: RRR, no murmurs, rubs, or gallops.  Respiratory: Clear to auscultation bilaterally. GI: Soft, nontender, non-distended  MS: No edema; No deformity.  R groin with mild ecchymosis. Neuro:  Nonfocal  Psych: Normal affect   Labs    High Sensitivity Troponin:  No results for input(s): TROPONINIHS in  the last 720 hours.   Chemistry Recent Labs  Lab 02/11/21 1455 02/16/21 0440  NA 141 135  K 4.9 3.8  CL 102 103  CO2 22 24  GLUCOSE 95 102*  BUN 13 16  CREATININE 1.15 1.15  CALCIUM 10.4* 9.0  GFRNONAA  --  >60  ANIONGAP  --  8    Lipids No results for input(s): CHOL, TRIG, HDL, LABVLDL, LDLCALC, CHOLHDL in the last 168 hours.  Hematology Recent Labs  Lab 02/11/21 1455 02/16/21 0440  WBC 9.5 9.3  RBC 5.89* 5.14  HGB 17.8* 15.3  HCT 51.3* 46.2  MCV 87 89.9  MCH 30.2 29.8  MCHC 34.7 33.1  RDW 13.4 13.2  PLT 225 208   Thyroid No results for input(s): TSH, FREET4 in the last 168 hours.  BNPNo results for input(s): BNP, PROBNP in the last 168 hours.  DDimer No results for input(s): DDIMER in the last 168 hours.   Radiology    CARDIAC CATHETERIZATION  Result Date: 02/15/2021 Conclusions: Severe native coronary artery disease, including 90% mid/distal LMCA stenosis, 50% ostial RCA stenosis, and a long segment of proximal through mid  RCA disease of up to 90%. Widely patent LIMA-LAD and radial-OM2 bypass graft. Atretic/chronically occluded RIMA-RPDA graft. Mildly elevated left heart filling pressures. Severely elevated right heart filling pressures.  Equalization of end-diastolic pressures raises possibility of restrictive/constrictive physiology. Low normal to mildly reduced cardiac output. Successful PCI to proximal-mid RCA using overlapping Onyx frontier 3.5 x 15 mm (proximal) and 3.0 x 38 mm (distal) drug-eluting stents with 0% residual stenosis and TIMI-3 flow. Recommendations: Overnight observation. Dual antiplatelet therapy with aspirin and clopidogrel for at least 6 months. Consider escalation of diuresis as soon as tomorrow if renal function is stable. Optimize goal-directed medical therapy for chronic HFrEF, as tolerated. Aggressive secondary prevention of coronary artery disease. Nelva Bush, MD [facility]   Cardiac Studies   LHC 02/16/20:   FINDINGS: Severe  native CAD including 90% distal LMCA, 50% ostial RCA, and 90% mid RCA lesions. Widely patent LIMA-LAD and free radial-OM. Occluded RIMA-rPDA. Mildly elevated left heart filling pressure. Severely elevated right heart filling pressure. Successful PCI to proximal/mid RCA using overlapping Onyx 3.5 x 15 mm and 3.0 x 38 mm drug-eluting stents with 0% residual stenosis and TIMI-3 flow.   RECOMMENDATIONS: Overnight recovery. DAPT with aspirin and clopidogrel for at least 6 months. If renal function stable, consider escalation of diuresis tomorrow (sooner if dyspnea develops). Escalate GDMT as tolerated. Aggressive secondary prevention of coronary artery disease.  Patient Profile     56 y.o. male with CAD status post CABG, chronic systolic diastolic heart failure (LVEF 30%) left bundle branch block, carotid stenosis, Hodgkin's lymphoma status post chemotherapy and XRT, and hyperlipidemia admitted for left heart cath in the setting of exertional dyspnea.  Assessment & Plan    # CAD s/p CABG:  #Pure hypercholesterolemia: Prior CABG in 2013.  He was seen in the outpatient setting for exertional dyspnea.  He was referred for cardiac cath and was found to have patent LIMA to LAD and radial artery to OM 2 graft.  The RIMA to RPDA was atretic.  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  He did undergo successful PCI to the proximal to mid RCA with Onyx for anterior DES.  Continue aspirin, carvedilol, rosuvastatin, and Zetia.  LDL was 143 on 11/2020.  His LDL goal needs to be less than 70.  Would recommend switching Zetia to Repatha as an outpatient.  # Chronic systolic and diastolic heart failure:  LVEF 20 to 25% down from 25 to 30% previously.  He has been on maximally tolerated guideline directed medical therapy.  He has been intolerant to spironolactone due to breast tenderness.  There was concern for orthostatic  hypotension with higher doses of carvedilol.  Intolerant to [employer].  Could consider eplerenone and start the spironolactone.  He will need a repeat echo 3 months after his intervention to see if his LVEF remains reduced, at which time he would need referral for ICD.  Agree with referral to advanced heart failure clinic.  There is concern for elevated filling pressures and constriction/restriction based on his heart cath.  At home he was taking Lasix as needed.  We will recommend that he start taking it daily.  He will need a BMP in a week.  Continue carvedilol, Entresto, and Farxiga.    For questions or updates, please contact [place] Please consult [email or web address] for contact info under        Signed, Skeet Latch, MD  02/16/2021, 8:39 AM

## 2021-02-16 NOTE — Discharge Summary (Signed)
Discharge Summary    Patient ID: Willie [name]  MRN: [id number], DOB/AGE: Jun 19, 1965 56 y.o.  Admit Date: 02/15/2021 Discharge Date: 02/16/2021  Primary Care Provider: Tracie Harrier, MD Primary Cardiologist: Dr. Rockey Situ, MD  Discharge Diagnoses    Principal Problem:   Unstable angina [facility]) Active Problems:   Hyperlipidemia   S/P CABG x 3   Chronic HFrEF (heart failure with reduced ejection fraction) (HCC)   CAD (coronary artery disease)   Essential hypertension   Allergies Allergies  Allergen Reactions   Spironolactone Other (See Comments)    Caused Chest swelling/knots around breast area.       History of Present Illness     56 year old male with history of CAD status post three-vessel CABG in 2013, HFrEF, LBBB, carotid artery disease, COVID, Hodgkin's lymphoma status post chemoradiation, prior tobacco use, and HLD who was recently seen in the office for follow-up of his echo and scheduled for diagnostic R/LHC.  He was previously followed by outside cardiology group.  LHC in 2013 that revealed multivessel CAD with recommendation for surgical revascularization.  He subsequently underwent three-vessel CABG at [facility] in 10/2011 with a LIMA to LAD, RIMA to distal RCA, and left radial to OM2.  EF noted to be 30% prior to her bypass surgery.  Lexiscan MPI in 08/2015 was without evidence of ischemia.  Echo in 2018 demonstrated a persistent cardiomyopathy with an EF of 20 to 25%, diffuse hypokinesis, grade 1 diastolic dysfunction, mildly dilated aortic root, mild mitral vegetation, mildly dilated left atrium, mildly dilated RV with moderately reduced RV systolic function.  Escalation of GDMT has been limited by orthostasis and breast tenderness on MRA.  He was previously evaluated by EP for consideration of ICD given his cardiomyopathy.  Cardiac MRI in 02/2017 demonstrated an improvement in his LV systolic function with an EF of 43% with minimal coronary pattern of LGE.  Given  improvement in his LV systolic function, ICD was deferred.  Echo in 04/2019 showed an EF of 25 to 30%, global hypokinesis, grade 2 diastolic dysfunction, moderately reduced RV systolic function with normal ventricular cavity size, normal PASP, trivial mitral regurgitation, mild dilatation of the aortic root measuring 39 mm.  He was seen in 01/2021 by his primary cardiologist noting an increase in exertional shortness of breath.  It was noted he had recently had COVID 3 weeks prior.  Most recent echo from 02/2021 showed a persistent cardiomyopathy with an EF of 20 to 25%, global hypokinesis, grade 2 diastolic dysfunction, normal RV systolic function and ventricular cavity size, moderately dilated left atrium, mild to moderate mitral regurgitation, borderline dilatation of the aortic root measuring 39 mm.  Given progressive exertional dyspnea symptoms, and in the context of his persistent cardiomyopathy, it was recommended he undergo R/LHC.  He was also referred to EP for reevaluation of device given his persistent cardiomyopathy, and in the context of his left bundle branch block.    Hospital Course     Consultants: Cardiac rehab    He [facility] for [facility] which demonstrated severe native vessel CAD including 90% mid/distal left main stenosis, 50% ostial RCA stenosis, and a long segment of proximal through mid RCA disease of up to 90%.  Widely patent LIMA to LAD and radial to OM 2 bypass grafts.  Treated/chronically occluded RIMA to RPDA graft.  Mildly elevated left heart filling pressures.  Severely elevated right heart filling pressures.  Equalization of end-diastolic pressures raised the possibility of restrictive/constrictive physiology.  Low normal to mildly reduced cardiac output.  He underwent successful PCI to the proximal/mid RCA using overlapping Onyx frontier 3.5 x 15 mm proximal and 3.0 x 38 mm distal drug-eluting stents with 0% residual stenosis.  DAPT with aspirin and  clopidogrel was recommended for at least 6 months.  Escalation of diuresis was encouraged as renal function allows along with optimization of GDMT.  Post-cath labs and vitals remained stable.  He has ambulated without difficulty.  His dyspnea is improved.  No significant cath site complications.    CAD status post CABG status post PCI:  -Chest pain free, dyspnea improved  -Continue DAPT with aspirin and clopidogrel without interruption for at least the next 6 months  -Aggressive risk factor modification and secondary prevention including continuation of carvedilol, Crestor, and Zetia -Post-cath instructions  -Cardiac rehab   HFrEF with LBBB:  -He has had a persistent cardiomyopathy with an EF ranging from 20 to 30% over the years  -Escalation of GDMT has been hampered by orthostatic hypotension and medication adverse effect  -Status post PCI as outlined above  -Elevated filling pressures noted on cardiac cath as outlined above -Previously on furosemide 40 mg daily as needed, titrate this to 40 mg daily with plan to follow-up labs next week -Continue carvedilol, Entresto, and Farxiga  -Not on spironolactone secondary to breast tenderness associated with this medication  -Continue to escalate GDMT as tolerated in the outpatient setting with recommendation to obtain a repeat limited echo in several months time following percutaneous revascularization and optimization of GDMT  -He has also been referred to EP for consideration of device given persistent cardiomyopathy and underlying left bundle branch block  -Keep appointment with the advanced heart failure clinic later this month  HLD:  -LDL 143 in 11/2020 with goal being less than 50  -Continue PTA Crestor and Zetia  -He may benefit from referral to the lipid clinic for consideration of PCSK9 inhibitor   HTN:  -Blood pressure is well controlled -Continue medical therapy as outlined above    The patient's right femoral arteriotomy cath  site has been examined is healing well without issues at this time. The patient has been seen by Dr. Oval Linsey and felt to be stable for discharge today. All follow up appointments have been made. Discharge medications are listed below. Prescriptions have been reviewed with the patient and sent in to their pharmacy.  _____________  Discharge Vitals Blood pressure 127/76, pulse 67, temperature 98.3 F (36.8 C), resp. rate 20, height 5\' 8"  (1.727 m), weight 97.1 kg, SpO2 96 %.  Filed Weights   02/15/21 0654  Weight: 97.1 kg   Physical Exam   GEN: No acute distress.   Neck: No JVD. Cardiac: RRR, no murmurs, rubs, or gallops.  Right femoral arteriotomy site with mild soft bruising, no active bleeding, swelling, warmth, erythema, or tenderness to palpation.  No bruit. Respiratory: Clear to auscultation bilaterally.  GI: Soft, nontender, non-distended.   MS: No edema; No deformity. Neuro:  Alert and oriented x 3; Nonfocal.  Psych: Normal affect.  Labs & Radiologic Studies    CBC Recent Labs    02/16/21 0440  WBC 9.3  HGB 15.3  HCT 46.2  MCV 89.9  PLT 132   Basic Metabolic Panel Recent Labs    02/16/21 0440  NA 135  K 3.8  CL 103  CO2 24  GLUCOSE 102*  BUN 16  CREATININE 1.15  CALCIUM 9.0   Liver Function Tests No results for input(s): AST,  ALT, ALKPHOS, BILITOT, PROT, ALBUMIN in the last 72 hours. No results for input(s): LIPASE, AMYLASE in the last 72 hours. Cardiac Enzymes No results for input(s): CKTOTAL, CKMB, CKMBINDEX, TROPONINI in the last 72 hours. BNP Invalid input(s): POCBNP D-Dimer No results for input(s): DDIMER in the last 72 hours. Hemoglobin A1C No results for input(s): HGBA1C in the last 72 hours. Fasting Lipid Panel No results for input(s): CHOL, HDL, LDLCALC, TRIG, CHOLHDL, LDLDIRECT in the last 72 hours. Thyroid Function Tests No results for input(s): TSH, T4TOTAL, T3FREE, THYROIDAB in the last 72 hours.  Invalid input(s):  FREET3 _____________   Diagnostic Studies/Procedures   R/LHC 02/15/2021: Conclusions: Severe native coronary artery disease, including 90% mid/distal LMCA stenosis, 50% ostial RCA stenosis, and a long segment of proximal through mid RCA disease of up to 90%. Widely patent LIMA-LAD and radial-OM2 bypass graft. Atretic/chronically occluded RIMA-RPDA graft. Mildly elevated left heart filling pressures. Severely elevated right heart filling pressures.  Equalization of end-diastolic pressures raises possibility of restrictive/constrictive physiology. Low normal to mildly reduced cardiac output. Successful PCI to proximal-mid RCA using overlapping Onyx frontier 3.5 x 15 mm (proximal) and 3.0 x 38 mm (distal) drug-eluting stents with 0% residual stenosis and TIMI-3 flow.   Recommendations: Overnight observation. Dual antiplatelet therapy with aspirin and clopidogrel for at least 6 months. Consider escalation of diuresis as soon as tomorrow if renal function is stable. Optimize goal-directed medical therapy for chronic HFrEF, as tolerated. Aggressive secondary prevention of coronary artery disease.    Diagnostic Dominance: Right Left Main  Vessel is large.  Mid LM to Dist LM lesion is 90% stenosed.    Left Anterior Descending  Vessel is large.    First Diagonal Branch  Vessel is large in size.    Second Diagonal Branch  Vessel is moderate in size.    Left Circumflex    First Obtuse Marginal Branch  Vessel is moderate in size.    Second Obtuse Marginal Branch  Vessel is large in size.    Third Obtuse Marginal Branch  Vessel is small in size.    Right Coronary Artery  Ost RCA to Prox RCA lesion is 50% stenosed.  Prox RCA lesion is 40% stenosed.  Mid RCA-1 lesion is 90% stenosed.  Mid RCA-2 lesion is 50% stenosed.    RIMA RIMA Graft To RPDA  RIMA graft was visualized by angiography. The graft is atretic.  Prox Graft to Insertion lesion is 100% stenosed.    Left Radial  Artery Graft To 2nd Mrg  Left radial artery graft was visualized by angiography and is normal in caliber. The graft exhibits no disease.    LIMA LIMA Graft To Mid LAD  LIMA graft was visualized by angiography and is small.    Intervention   Prox RCA lesion  Stent (Also treats lesions: Mid RCA-1)  A drug-eluting stent was successfully placed using a STENT ONYX FRONTIER 3.5X15.  Post-Intervention Lesion Assessment  The intervention was successful. Pre-interventional TIMI flow is 3. Post-intervention TIMI flow is 3. No complications occurred at this lesion.  There is a 0% residual stenosis post intervention.    Mid RCA-1 lesion  Stent (Also treats lesions: Mid RCA-2)  A drug-eluting stent was successfully placed using a STENT ONYX FRONTIER 3.0X38.  Stent (Also treats lesions: Prox RCA)  See details in Prox RCA lesion.  Post-Intervention Lesion Assessment  The intervention was successful. Pre-interventional TIMI flow is 3. Post-intervention TIMI flow is 3. No complications occurred at this lesion.  There is  a 0% residual stenosis post intervention.    Mid RCA-2 lesion  Stent (Also treats lesions: Mid RCA-1)  See details in Mid RCA-1 lesion.  Post-Intervention Lesion Assessment  The intervention was successful. Pre-interventional TIMI flow is 3. Post-intervention TIMI flow is 3.  There is a 0% residual stenosis post intervention.     Right Heart  Right Heart Pressures RA (mean): 18 mmHg RV (S/EDP): 36/15 mmHg PA (S/D, mean): 33/18 (23) mmHg PCWP (mean): 20 mmHg  Ao sat: 97% PA sat: 71%  Fick CO: 4.2 L/min Fick CI: 2.0 L/min/m^2  Thermodilution CO: 5.3 L/min Thermodilution CI: 2.5 L/min/m^2   Left Heart  Left Ventricle LV end diastolic pressure is mildly elevated. LVEDP 20 mmHg.  Aortic Valve There is no aortic valve stenosis.   Coronary Diagrams  Diagnostic Dominance: Right Intervention   _____________  Disposition   Pt is being discharged home today in good  condition.  Follow-up Plans & Appointments     Follow-up Information     Rise Mu, PA-C Follow up on 02/27/2021.   Specialties: Librarian, academic, Cardiology, Radiology Why: Appointment time: 2:50 PM Contact information: [address] [place] [address] [phone]                Discharge Instructions     AMB Referral to Cardiac Rehabilitation - Phase II   Complete by: As directed    Diagnosis: Coronary Stents   After initial evaluation and assessments completed: Virtual Based Care may be provided alone or in conjunction with Phase 2 Cardiac Rehab based on patient barriers.: Yes   Diet - low sodium heart healthy   Complete by: As directed    Increase activity slowly   Complete by: As directed        Discharge Medications   Allergies as of 02/16/2021       Reactions   Spironolactone Other (See Comments)   Caused Chest swelling/knots around breast area.         Medication List     STOP taking these medications    phenylephrine 10 MG Tabs tablet Commonly known as: SUDAFED PE       TAKE these medications    allopurinol 300 MG tablet Commonly known as: ZYLOPRIM Take 300 mg by mouth in the morning.   aspirin EC 81 MG tablet Take 81 mg by mouth every evening.   carvedilol 3.125 MG tablet Commonly known as: COREG Take 1 tablet (3.125 mg total) by mouth 2 (two) times daily.   clopidogrel 75 MG tablet Commonly known as: PLAVIX Take 1 tablet (75 mg total) by mouth daily with breakfast.   colchicine 0.6 MG tablet Take 0.6 mg by mouth daily as needed (GOUT FLARES).   Entresto 24-26 MG Generic drug: sacubitril-valsartan TAKE ONE TABLET BY MOUTH TWICE A DAY   ezetimibe 10 MG tablet Commonly known as: ZETIA TAKE ONE TABLET BY MOUTH ONE TIME DAILY   Farxiga 10 MG Tabs tablet Generic drug: dapagliflozin propanediol TAKE ONE TABLET BY MOUTH EVERY MORNING BEFORE BREAKFAST   furosemide 40 MG tablet Commonly known as:  LASIX Take 1 tablet (40 mg total) by mouth daily. What changed:  when to take this reasons to take this   pantoprazole 40 MG tablet Commonly known as: PROTONIX Take 40 mg by mouth in the morning.   potassium chloride 10 MEQ tablet Commonly known as: KLOR-CON M Take 1 tablet (10 mEq total) by mouth daily. What changed:  when to take this  reasons to take this   rosuvastatin 40 MG tablet Commonly known as: CRESTOR Take 1 tablet (40 mg total) by mouth daily.   senna 8.6 MG tablet Commonly known as: SENOKOT Take 4 tablets by mouth at bedtime.   vitamin B-12 1000 MCG tablet Commonly known as: CYANOCOBALAMIN Take 1 tablet (1,000 mcg total) by mouth daily.         Aspirin prescribed at discharge?  Yes High Intensity Statin Prescribed? (Lipitor 40-80mg  or Crestor 20-40mg ): Yes Beta Blocker Prescribed? Yes For EF <40%, was ACEI/ARB Prescribed? Yes ADP Receptor Inhibitor Prescribed? (i.e. Plavix etc.-Includes Medically Managed Patients): Yes For EF <40%, Aldosterone Inhibitor Prescribed? No: Allergy Was EF assessed during THIS hospitalization? No: recent echo  Was Cardiac Rehab II ordered? (Included Medically managed Patients): Yes   Outstanding Labs/Studies   None.  Duration of Discharge Encounter   Greater than 30 minutes including physician time.  Signed, Rise Mu, PA-C [facility] Pager: [phone] 02/16/2021, 9:01 AM

## 2021-02-16 NOTE — Progress Notes (Signed)
Theodora Blow to be D/C'd Home per MD order.  Discussed prescriptions and follow up appointments with the patient. Prescriptions electronically submitted.medication list explained in detail. Pt verbalized understanding. Vascular instructions gone over with patient.  Allergies as of 02/16/2021       Reactions   Spironolactone Other (See Comments)   Caused Chest swelling/knots around breast area.         Medication List     STOP taking these medications    phenylephrine 10 MG Tabs tablet Commonly known as: SUDAFED PE       TAKE these medications    allopurinol 300 MG tablet Commonly known as: ZYLOPRIM Take 300 mg by mouth in the morning.   aspirin EC 81 MG tablet Take 81 mg by mouth every evening.   carvedilol 3.125 MG tablet Commonly known as: COREG Take 1 tablet (3.125 mg total) by mouth 2 (two) times daily.   clopidogrel 75 MG tablet Commonly known as: PLAVIX Take 1 tablet (75 mg total) by mouth daily with breakfast.   colchicine 0.6 MG tablet Take 0.6 mg by mouth daily as needed (GOUT FLARES).   Entresto 24-26 MG Generic drug: sacubitril-valsartan TAKE ONE TABLET BY MOUTH TWICE A DAY   ezetimibe 10 MG tablet Commonly known as: ZETIA TAKE ONE TABLET BY MOUTH ONE TIME DAILY   Farxiga 10 MG Tabs tablet Generic drug: dapagliflozin propanediol TAKE ONE TABLET BY MOUTH EVERY MORNING BEFORE BREAKFAST   furosemide 40 MG tablet Commonly known as: LASIX Take 1 tablet (40 mg total) by mouth daily. What changed:  when to take this reasons to take this   pantoprazole 40 MG tablet Commonly known as: PROTONIX Take 40 mg by mouth in the morning.   potassium chloride 10 MEQ tablet Commonly known as: KLOR-CON M Take 1 tablet (10 mEq total) by mouth daily. What changed:  when to take this reasons to take this   rosuvastatin 40 MG tablet Commonly known as: CRESTOR Take 1 tablet (40 mg total) by mouth daily.   senna 8.6 MG tablet Commonly known as:  SENOKOT Take 4 tablets by mouth at bedtime.   vitamin B-12 1000 MCG tablet Commonly known as: CYANOCOBALAMIN Take 1 tablet (1,000 mcg total) by mouth daily.        Vitals:   02/16/21 0400 02/16/21 0815  BP: 103/62 127/76  Pulse: 67 67  Resp: 16 20  Temp: 97.8 F (36.6 C) 98.3 F (36.8 C)  SpO2: 97% 96%    Skin clean, dry and intact without evidence of skin break down, no evidence of skin tears noted. IV catheter discontinued intact. Site without signs and symptoms of complications. Dressing and pressure applied. Pt denies pain at this time. No complaints noted.  An After Visit Summary was printed and given to the patient. Patient escorted via [place], and D/C home via private auto.  [place]

## 2021-02-16 NOTE — Progress Notes (Signed)
°  Transition of Care [facility]) Screening Note   Patient Details  Name: Willie [name] Date of Birth: [birth year]   Transition of Care [facility]) CM/SW Contact:    Alberteen Sam, LCSW Phone Number: 02/16/2021, 9:30 AM    Transition of Care Department [facility]) has reviewed patient and no TOC needs have been identified at this time. We will continue to monitor patient advancement through interdisciplinary progression rounds. If new patient transition needs arise, please place a TOC consult.  [place], [place]

## 2021-02-18 ENCOUNTER — Encounter: Payer: Self-pay | Admitting: Internal Medicine

## 2021-02-19 ENCOUNTER — Telehealth: Payer: Self-pay | Admitting: *Deleted

## 2021-02-19 DIAGNOSIS — I5020 Unspecified systolic (congestive) heart failure: Secondary | ICD-10-CM

## 2021-02-19 DIAGNOSIS — I255 Ischemic cardiomyopathy: Secondary | ICD-10-CM

## 2021-02-19 DIAGNOSIS — I25118 Atherosclerotic heart disease of native coronary artery with other forms of angina pectoris: Secondary | ICD-10-CM

## 2021-02-19 NOTE — Telephone Encounter (Signed)
-----   Message from Rise Mu, [place] sent at 02/16/2021  9:02 AM EST ----- Please schedule patient for a BMET in our office to be drawn on 2/16 or 2/17. Thanks

## 2021-02-19 NOTE — Telephone Encounter (Signed)
Reviewed recommendations with provider regarding repeat labs on Thursday or Friday. He would like to come Thursday afternoon and will come to our office for those to be done. He verbalized agreement with plan and had no further questions at this time.

## 2021-02-20 ENCOUNTER — Other Ambulatory Visit: Payer: Managed Care, Other (non HMO)

## 2021-02-21 ENCOUNTER — Other Ambulatory Visit (INDEPENDENT_AMBULATORY_CARE_PROVIDER_SITE_OTHER): Payer: Managed Care, Other (non HMO)

## 2021-02-21 ENCOUNTER — Encounter: Payer: Managed Care, Other (non HMO) | Attending: Internal Medicine

## 2021-02-21 ENCOUNTER — Other Ambulatory Visit: Payer: Self-pay

## 2021-02-21 DIAGNOSIS — Z955 Presence of coronary angioplasty implant and graft: Secondary | ICD-10-CM | POA: Insufficient documentation

## 2021-02-21 DIAGNOSIS — I5020 Unspecified systolic (congestive) heart failure: Secondary | ICD-10-CM | POA: Diagnosis not present

## 2021-02-21 DIAGNOSIS — I255 Ischemic cardiomyopathy: Secondary | ICD-10-CM

## 2021-02-21 DIAGNOSIS — I25118 Atherosclerotic heart disease of native coronary artery with other forms of angina pectoris: Secondary | ICD-10-CM

## 2021-02-21 DIAGNOSIS — Z48812 Encounter for surgical aftercare following surgery on the circulatory system: Secondary | ICD-10-CM | POA: Insufficient documentation

## 2021-02-22 LAB — BASIC METABOLIC PANEL
BUN/Creatinine Ratio: 18 (ref 9–20)
BUN: 21 mg/dL (ref 6–24)
CO2: 20 mmol/L (ref 20–29)
Calcium: 9.9 mg/dL (ref 8.7–10.2)
Chloride: 101 mmol/L (ref 96–106)
Creatinine, Ser: 1.2 mg/dL (ref 0.76–1.27)
Glucose: 78 mg/dL (ref 70–99)
Potassium: 5 mmol/L (ref 3.5–5.2)
Sodium: 140 mmol/L (ref 134–144)
eGFR: 71 mL/min/{1.73_m2} (ref >59)

## 2021-02-25 ENCOUNTER — Other Ambulatory Visit: Payer: Self-pay

## 2021-02-25 ENCOUNTER — Encounter: Payer: Managed Care, Other (non HMO) | Admitting: *Deleted

## 2021-02-25 DIAGNOSIS — Z955 Presence of coronary angioplasty implant and graft: Secondary | ICD-10-CM

## 2021-02-25 NOTE — Progress Notes (Signed)
Initial telephone orientation completed. Diagnosis can be found in [facility] 2/10. EP orientation scheduled for Tuesday 2/21 at 8am.

## 2021-02-26 VITALS — Ht 70.0 in | Wt 220.7 lb

## 2021-02-26 DIAGNOSIS — Z955 Presence of coronary angioplasty implant and graft: Secondary | ICD-10-CM

## 2021-02-26 DIAGNOSIS — Z48812 Encounter for surgical aftercare following surgery on the circulatory system: Secondary | ICD-10-CM | POA: Diagnosis not present

## 2021-02-26 NOTE — Progress Notes (Signed)
Cardiac Individual Treatment Plan  Patient Details  Name: Willie [name] MRN: [id number] Date of Birth: 1965-02-02 Referring Provider:   Flowsheet Row Cardiac Rehab from 02/26/2021 in [facility] Cardiac and Pulmonary Rehab  Referring Provider End, Harrell Gave MD       Initial Encounter Date:  Flowsheet Row Cardiac Rehab from 02/26/2021 in [facility] Cardiac and Pulmonary Rehab  Date 02/26/21       Visit Diagnosis: Status post coronary artery stent placement  Patient's Home Medications on Admission:  Current Outpatient Medications:    allopurinol (ZYLOPRIM) 300 MG tablet, Take 300 mg by mouth in the morning., Disp: , Rfl:    aspirin EC 81 MG tablet, Take 81 mg by mouth every evening., Disp: , Rfl:    carvedilol (COREG) 3.125 MG tablet, Take 1 tablet (3.125 mg total) by mouth 2 (two) times daily., Disp: 180 tablet, Rfl: 1   clopidogrel (PLAVIX) 75 MG tablet, Take 1 tablet (75 mg total) by mouth daily with breakfast., Disp: 90 tablet, Rfl: 3   colchicine 0.6 MG tablet, Take 0.6 mg by mouth daily as needed (GOUT FLARES)., Disp: , Rfl:    ENTRESTO 24-26 MG, TAKE ONE TABLET BY MOUTH TWICE A DAY, Disp: 180 tablet, Rfl: 0   ezetimibe (ZETIA) 10 MG tablet, TAKE ONE TABLET BY MOUTH ONE TIME DAILY, Disp: 90 tablet, Rfl: 0   FARXIGA 10 MG TABS tablet, TAKE ONE TABLET BY MOUTH EVERY MORNING BEFORE BREAKFAST, Disp: 90 tablet, Rfl: 0   furosemide (LASIX) 40 MG tablet, Take 1 tablet (40 mg total) by mouth daily., Disp: 90 tablet, Rfl: 3   pantoprazole (PROTONIX) 40 MG tablet, Take 40 mg by mouth in the morning., Disp: , Rfl:    potassium chloride (KLOR-CON M) 10 MEQ tablet, Take 1 tablet (10 mEq total) by mouth daily., Disp: 90 tablet, Rfl: 3   rosuvastatin (CRESTOR) 40 MG tablet, Take 1 tablet (40 mg total) by mouth daily., Disp: 90 tablet, Rfl: 3   senna (SENOKOT) 8.6 MG tablet, Take 4 tablets by mouth at bedtime., Disp: , Rfl:    vitamin B-12 (CYANOCOBALAMIN) 1000 MCG tablet, Take 1 tablet (1,000 mcg total)  by mouth daily., Disp: 30 tablet, Rfl: 1  Past Medical History: Past Medical History:  Diagnosis Date   Coronary artery disease    a. 10/2011 Cath: LM 85, LAD 20p, D1 20, LCX 20/56m, OM1 85, RCA 60ost, 43m, 50d; b. 10/2011 CABG x 3: LIMA->LAD, RIMA->dRCA, L Radial->OM2; c. 08/2015 MV: EF 33%, apical ant, apical septal, apical infarct, no ischemia.   Family history of coronary artery disease 10/23/2011   HFrEF (heart failure with reduced ejection fraction) ([place])    a. 02/2017 cMRI: EF 43%, mod MR, minimal LGE, nl RV size/fxn; b. 04/2019 Echo: EF 25-30%, glob HK, gr2 DD, mod reduced RV fxn, triv MR/MR, Ao root 14mm.   Hodgkin's lymphoma ([place]) 1994   neck/midchest area with radiation   Hyperlipidemia 10/23/2011   Hypertension    Ischemic cardiomyopathy    a. 08/2016 Echo: EF 20-25%; b. 02/2017 cMRI: EF 43% (no indication for ICD based on this finding); c. 04/2019 Echo: EF 25-30%.   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   LIMA to LAD, RIMA to RCA, LRA to OM2   S/P radiation therapy 10/21/1992   Radiation therapy to chest and neck for Hodgkin's lymphoma   Unstable angina ([place]) 10/22/2011    Tobacco Use: Social History   Tobacco Use  Smoking Status  Former   Packs/day: 0.50   Types: Cigarettes   Quit date: 10/21/1996   Years since quitting: 24.3  Smokeless Tobacco Former   Types: Snuff   Quit date: 04/05/2019    Labs: Recent Review Flowsheet Data     Labs for ITP Cardiac and Pulmonary Rehab Latest Ref Rng & Units 10/24/2011 10/24/2011 10/24/2011 10/24/2011 10/25/2011   Cholestrol 0 - 200 mg/dL - - - - -   LDLCALC 0 - 99 mg/dL - - - - -   HDL >39 mg/dL - - - - -   Trlycerides <150 mg/dL - - - - -   Hemoglobin A1c <5.7 % - - - - -   PHART 7.350 - 7.450 7.370 7.315(L) 7.318(L) - -   PCO2ART 35.0 - 45.0 mmHg 37.8 42.9 42.4 - -   HCO3 20.0 - 24.0 mEq/L 22.0 21.8 21.7 - -   TCO2 0 - 100 mmol/L 23 23 23 22 24    ACIDBASEDEF 0.0 - 2.0 mmol/L 3.0(H) 4.0(H)  4.0(H) - -   O2SAT % 97.0 97.0 97.0 - -        Exercise Target Goals: Exercise Program Goal: Individual exercise prescription set using results from initial 6 min walk test and THRR while considering  patients activity barriers and safety.   Exercise Prescription Goal: Initial exercise prescription builds to 30-45 minutes a day of aerobic activity, 2-3 days per week.  Home exercise guidelines will be given to patient during program as part of exercise prescription that the participant will acknowledge.   Education: Aerobic Exercise: - Group verbal and visual presentation on the components of exercise prescription. Introduces F.I.T.T principle from ACSM for exercise prescriptions.  Reviews F.I.T.T. principles of aerobic exercise including progression. Written material given at graduation.   Education: Resistance Exercise: - Group verbal and visual presentation on the components of exercise prescription. Introduces F.I.T.T principle from ACSM for exercise prescriptions  Reviews F.I.T.T. principles of resistance exercise including progression. Written material given at graduation.    Education: Exercise & Equipment Safety: - Individual verbal instruction and demonstration of equipment use and safety with use of the equipment. Flowsheet Row Cardiac Rehab from 02/26/2021 in [facility] Cardiac and Pulmonary Rehab  Education need identified 02/26/21  Date 02/26/21  Educator [place]  Instruction Review Code 1- Verbalizes Understanding       Education: Exercise Physiology & General Exercise Guidelines: - Group verbal and written instruction with models to review the exercise physiology of the cardiovascular system and associated critical values. Provides general exercise guidelines with specific guidelines to those with heart or lung disease.    Education: Flexibility, Balance, Mind/Body Relaxation: - Group verbal and visual presentation with interactive activity on the components of exercise  prescription. Introduces F.I.T.T principle from ACSM for exercise prescriptions. Reviews F.I.T.T. principles of flexibility and balance exercise training including progression. Also discusses the mind body connection.  Reviews various relaxation techniques to help reduce and manage stress (i.e. Deep breathing, progressive muscle relaxation, and visualization). Balance handout provided to take home. Written material given at graduation.   Activity Barriers & Risk Stratification:  Activity Barriers & Cardiac Risk Stratification - 02/26/21 0947       Activity Barriers & Cardiac Risk Stratification   Activity Barriers Decreased Ventricular Function    Cardiac Risk Stratification High             6 Minute Walk:  6 Minute Walk     Row Name 02/26/21 1149  6 Minute Walk   Phase Initial     Distance 1335 feet     Walk Time 6 minutes     # of Rest Breaks 0     MPH 2.52     METS 3.41     RPE 7     Perceived Dyspnea  0     VO2 Peak 11.96     Symptoms No     Resting HR 82 bpm     Resting BP 108/64     Resting Oxygen Saturation  98 %     Exercise Oxygen Saturation  during 6 min walk 99 %     Max Ex. HR 109 bpm     Max Ex. BP 116/70     2 Minute Post BP 114/72              Oxygen Initial Assessment:   Oxygen Re-Evaluation:   Oxygen Discharge (Final Oxygen Re-Evaluation):   Initial Exercise Prescription:  Initial Exercise Prescription - 02/26/21 1100       Date of Initial Exercise RX and Referring Provider   Date 02/26/21    Referring Provider End, Harrell Gave MD      Oxygen   Maintain Oxygen Saturation 88% or higher      Treadmill   MPH 2.7    Grade 1    Minutes 15    METs 3.44      Recumbant Bike   Level 3    RPM 60    Watts 25    Minutes 15    METs 3.4      REL-XR   Level 3    Speed 50    Minutes 15    METs 3.4      Track   Laps 37    Minutes 15    METs 3.01      Prescription Details   Frequency (times per week) 3    Duration  Progress to 30 minutes of continuous aerobic without signs/symptoms of physical distress      Intensity   THRR 40-80% of Max Heartrate 114-146    Ratings of Perceived Exertion 11-13    Perceived Dyspnea 0-4      Progression   Progression Continue to progress workloads to maintain intensity without signs/symptoms of physical distress.      Resistance Training   Training Prescription Yes    Weight 5 lb    Reps 10-15             Perform Capillary Blood Glucose checks as needed.  Exercise Prescription Changes:   Exercise Prescription Changes     Row Name 02/26/21 1100             Response to Exercise   Blood Pressure (Admit) 108/64       Blood Pressure (Exercise) 116/70       Blood Pressure (Exit) 114/72       Heart Rate (Admit) 82 bpm       Heart Rate (Exercise) 109 bpm       Heart Rate (Exit) 77 bpm       Oxygen Saturation (Admit) 98 %       Oxygen Saturation (Exercise) 99 %       Rating of Perceived Exertion (Exercise) 7       Perceived Dyspnea (Exercise) 0       Symptoms none       Comments walk test results  Exercise Comments:   Exercise Goals and Review:   Exercise Goals     Row Name 02/26/21 1205             Exercise Goals   Increase Physical Activity Yes       Intervention Provide advice, education, support and counseling about physical activity/exercise needs.;Develop an individualized exercise prescription for aerobic and resistive training based on initial evaluation findings, risk stratification, comorbidities and participant's personal goals.       Expected Outcomes Short Term: Attend rehab on a regular basis to increase amount of physical activity.;Long Term: Add in home exercise to make exercise part of routine and to increase amount of physical activity.;Long Term: Exercising regularly at least 3-5 days a week.       Increase Strength and Stamina Yes       Intervention Provide advice, education, support and counseling  about physical activity/exercise needs.;Develop an individualized exercise prescription for aerobic and resistive training based on initial evaluation findings, risk stratification, comorbidities and participant's personal goals.       Expected Outcomes Short Term: Increase workloads from initial exercise prescription for resistance, speed, and METs.;Short Term: Perform resistance training exercises routinely during rehab and add in resistance training at home;Long Term: Improve cardiorespiratory fitness, muscular endurance and strength as measured by increased METs and functional capacity (6MWT)       Able to understand and use rate of perceived exertion (RPE) scale Yes       Intervention Provide education and explanation on how to use RPE scale       Expected Outcomes Short Term: Able to use RPE daily in rehab to express subjective intensity level;Long Term:  Able to use RPE to guide intensity level when exercising independently       Able to understand and use Dyspnea scale Yes       Intervention Provide education and explanation on how to use Dyspnea scale       Expected Outcomes Short Term: Able to use Dyspnea scale daily in rehab to express subjective sense of shortness of breath during exertion;Long Term: Able to use Dyspnea scale to guide intensity level when exercising independently       Knowledge and understanding of Target Heart Rate Range (THRR) Yes       Intervention Provide education and explanation of THRR including how the numbers were predicted and where they are located for reference       Expected Outcomes Short Term: Able to state/look up THRR;Long Term: Able to use THRR to govern intensity when exercising independently;Short Term: Able to use daily as guideline for intensity in rehab       Able to check pulse independently Yes       Intervention Provide education and demonstration on how to check pulse in carotid and radial arteries.;Review the importance of being able to check your  own pulse for safety during independent exercise       Expected Outcomes Short Term: Able to explain why pulse checking is important during independent exercise;Long Term: Able to check pulse independently and accurately       Understanding of Exercise Prescription Yes       Intervention Provide education, explanation, and written materials on patient's individual exercise prescription       Expected Outcomes Short Term: Able to explain program exercise prescription;Long Term: Able to explain home exercise prescription to exercise independently                Exercise  Goals Re-Evaluation :   Discharge Exercise Prescription (Final Exercise Prescription Changes):  Exercise Prescription Changes - 02/26/21 1100       Response to Exercise   Blood Pressure (Admit) 108/64    Blood Pressure (Exercise) 116/70    Blood Pressure (Exit) 114/72    Heart Rate (Admit) 82 bpm    Heart Rate (Exercise) 109 bpm    Heart Rate (Exit) 77 bpm    Oxygen Saturation (Admit) 98 %    Oxygen Saturation (Exercise) 99 %    Rating of Perceived Exertion (Exercise) 7    Perceived Dyspnea (Exercise) 0    Symptoms none    Comments walk test results             Nutrition:  Target Goals: Understanding of nutrition guidelines, daily intake of sodium 1500mg , cholesterol 200mg , calories 30% from fat and 7% or less from saturated fats, daily to have 5 or more servings of fruits and vegetables.  Education: All About Nutrition: -Group instruction provided by verbal, written material, interactive activities, discussions, models, and posters to present general guidelines for heart healthy nutrition including fat, fiber, MyPlate, the role of sodium in heart healthy nutrition, utilization of the nutrition label, and utilization of this knowledge for meal planning. Follow up email sent as well. Written material given at graduation. Flowsheet Row Cardiac Rehab from 02/26/2021 in [facility] Cardiac and Pulmonary Rehab   Education need identified 02/26/21       Biometrics:  Pre Biometrics - 02/26/21 0946       Pre Biometrics   Height 5\' 10"  (1.778 m)    Weight 220 lb 11.2 oz (100.1 kg)    BMI (Calculated) 31.67    Single Leg Stand 30 seconds              Nutrition Therapy Plan and Nutrition Goals:  Nutrition Therapy & Goals - 02/26/21 0947       Intervention Plan   Intervention Prescribe, educate and counsel regarding individualized specific dietary modifications aiming towards targeted core components such as weight, hypertension, lipid management, diabetes, heart failure and other comorbidities.    Expected Outcomes Short Term Goal: Understand basic principles of dietary content, such as calories, fat, sodium, cholesterol and nutrients.;Short Term Goal: A plan has been developed with personal nutrition goals set during dietitian appointment.;Long Term Goal: Adherence to prescribed nutrition plan.             Nutrition Assessments:  MEDIFICTS Score Key: ?70 Need to make dietary changes  40-70 Heart Healthy Diet ? 40 Therapeutic Level Cholesterol Diet  Flowsheet Row Cardiac Rehab from 02/26/2021 in [facility] Cardiac and Pulmonary Rehab  Picture Your Plate Total Score on Admission 55      Picture Your Plate Scores: <24 Unhealthy dietary pattern with much room for improvement. 41-50 Dietary pattern unlikely to meet recommendations for good health and room for improvement. 51-60 More healthful dietary pattern, with some room for improvement.  >60 Healthy dietary pattern, although there may be some specific behaviors that could be improved.    Nutrition Goals Re-Evaluation:   Nutrition Goals Discharge (Final Nutrition Goals Re-Evaluation):   Psychosocial: Target Goals: Acknowledge presence or absence of significant depression and/or stress, maximize coping skills, provide positive support system. Participant is able to verbalize types and ability to use techniques and skills  needed for reducing stress and depression.   Education: Stress, Anxiety, and Depression - Group verbal and visual presentation to define topics covered.  Reviews how body is impacted by  stress, anxiety, and depression.  Also discusses healthy ways to reduce stress and to treat/manage anxiety and depression.  Written material given at graduation.   Education: Sleep Hygiene -Provides group verbal and written instruction about how sleep can affect your health.  Define sleep hygiene, discuss sleep cycles and impact of sleep habits. Review good sleep hygiene tips.    Initial Review & Psychosocial Screening:  Initial Psych Review & Screening - 02/25/21 1408       Initial Review   Current issues with None Identified      Family Dynamics   Good Support System? Yes   wife     Barriers   Psychosocial barriers to participate in program There are no identifiable barriers or psychosocial needs.;The patient should benefit from training in stress management and relaxation.      Screening Interventions   Interventions Encouraged to exercise;Provide feedback about the scores to participant;To provide support and resources with identified psychosocial needs    Expected Outcomes Short Term goal: Utilizing psychosocial counselor, staff and physician to assist with identification of specific Stressors or current issues interfering with healing process. Setting desired goal for each stressor or current issue identified.;Long Term Goal: Stressors or current issues are controlled or eliminated.;Long Term goal: The participant improves quality of Life and PHQ9 Scores as seen by post scores and/or verbalization of changes;Short Term goal: Identification and review with participant of any Quality of Life or Depression concerns found by scoring the questionnaire.             Quality of Life Scores:   Quality of Life - 02/26/21 0950       Quality of Life   Select Quality of Life      Quality of Life  Scores   Health/Function Pre 28.4 %    Socioeconomic Pre 30 %    Psych/Spiritual Pre 30 %    Family Pre 30 %    GLOBAL Pre 29.27 %            Scores of 19 and below usually indicate a poorer quality of life in these areas.  A difference of  2-3 points is a clinically meaningful difference.  A difference of 2-3 points in the total score of the Quality of Life Index has been associated with significant improvement in overall quality of life, self-image, physical symptoms, and general health in studies assessing change in quality of life.  PHQ-9: Recent Review Flowsheet Data     Depression screen [facility] 2/9 02/26/2021   Decreased Interest 0   Down, Depressed, Hopeless 0   PHQ - 2 Score 0   Altered sleeping 0   Tired, decreased energy 0   Change in appetite 0   Feeling bad or failure about yourself  0   Trouble concentrating 0   Moving slowly or fidgety/restless 0   Suicidal thoughts 0   PHQ-9 Score 0   Difficult doing work/chores Not difficult at all      Interpretation of Total Score  Total Score Depression Severity:  1-4 = Minimal depression, 5-9 = Mild depression, 10-14 = Moderate depression, 15-19 = Moderately severe depression, 20-27 = Severe depression   Psychosocial Evaluation and Intervention:  Psychosocial Evaluation - 02/25/21 1416       Psychosocial Evaluation & Interventions   Interventions Encouraged to exercise with the program and follow exercise prescription    Comments Mr. Longmore reports feeling well after his stent. He states he is back to his usual routine. He does  not report any stress concerns at this time. His wife is his main support system. He is looking forward to coming to the program to work on stamina    Expected Outcomes Short: attend cardiac rehab for education and exercise. Long: develop and maintain positive habits.    Continue Psychosocial Services  Follow up required by staff             Psychosocial Re-Evaluation:   Psychosocial  Discharge (Final Psychosocial Re-Evaluation):   Vocational Rehabilitation: Provide vocational rehab assistance to qualifying candidates.   Vocational Rehab Evaluation & Intervention:  Vocational Rehab - 02/25/21 1408       Initial Vocational Rehab Evaluation & Intervention   Assessment shows need for Vocational Rehabilitation No             Education: Education Goals: Education classes will be provided on a variety of topics geared toward better understanding of heart health and risk factor modification. Participant will state understanding/return demonstration of topics presented as noted by education test scores.  Learning Barriers/Preferences:  Learning Barriers/Preferences - 02/25/21 1408       Learning Barriers/Preferences   Learning Barriers None    Learning Preferences None             General Cardiac Education Topics:  AED/CPR: - Group verbal and written instruction with the use of models to demonstrate the basic use of the AED with the basic ABC's of resuscitation.   Anatomy and Cardiac Procedures: - Group verbal and visual presentation and models provide information about basic cardiac anatomy and function. Reviews the testing methods done to diagnose heart disease and the outcomes of the test results. Describes the treatment choices: Medical Management, Angioplasty, or Coronary Bypass Surgery for treating various heart conditions including Myocardial Infarction, Angina, Valve Disease, and Cardiac Arrhythmias.  Written material given at graduation. Flowsheet Row Cardiac Rehab from 02/26/2021 in [facility] Cardiac and Pulmonary Rehab  Education need identified 02/26/21       Medication Safety: - Group verbal and visual instruction to review commonly prescribed medications for heart and lung disease. Reviews the medication, class of the drug, and side effects. Includes the steps to properly store meds and maintain the prescription regimen.  Written material given  at graduation.   Intimacy: - Group verbal instruction through game format to discuss how heart and lung disease can affect sexual intimacy. Written material given at graduation..   Know Your Numbers and Heart Failure: - Group verbal and visual instruction to discuss disease risk factors for cardiac and pulmonary disease and treatment options.  Reviews associated critical values for Overweight/Obesity, Hypertension, Cholesterol, and Diabetes.  Discusses basics of heart failure: signs/symptoms and treatments.  Introduces Heart Failure Zone chart for action plan for heart failure.  Written material given at graduation. Flowsheet Row Cardiac Rehab from 02/26/2021 in [facility] Cardiac and Pulmonary Rehab  Education need identified 02/26/21       Infection Prevention: - Provides verbal and written material to individual with discussion of infection control including proper hand washing and proper equipment cleaning during exercise session. Flowsheet Row Cardiac Rehab from 02/26/2021 in [facility] Cardiac and Pulmonary Rehab  Education need identified 02/26/21  Date 02/26/21  Educator [place]  Instruction Review Code 1- Verbalizes Understanding       Falls Prevention: - Provides verbal and written material to individual with discussion of falls prevention and safety. Flowsheet Row Cardiac Rehab from 02/26/2021 in [facility] Cardiac and Pulmonary Rehab  Education need identified 02/26/21  Date 02/26/21  Educator [place]  Instruction Review Code 1- Verbalizes Understanding       Other: -Provides group and verbal instruction on various topics (see comments)   Knowledge Questionnaire Score:  Knowledge Questionnaire Score - 02/26/21 0946       Knowledge Questionnaire Score   Pre Score 24/26: Nutrition, Angina             Core Components/Risk Factors/Patient Goals at Admission:  Personal Goals and Risk Factors at Admission - 02/26/21 1206       Core Components/Risk Factors/Patient Goals on Admission     Weight Management Weight Loss;Yes    Intervention Weight Management: Develop a combined nutrition and exercise program designed to reach desired caloric intake, while maintaining appropriate intake of nutrient and fiber, sodium and fats, and appropriate energy expenditure required for the weight goal.;Weight Management: Provide education and appropriate resources to help participant work on and attain dietary goals.;Weight Management/Obesity: Establish reasonable short term and long term weight goals.    Admit Weight 220 lb (99.8 kg)    Goal Weight: Short Term 215 lb (97.5 kg)    Goal Weight: Long Term 200 lb (90.7 kg)    Expected Outcomes Short Term: Continue to assess and modify interventions until short term weight is achieved;Long Term: Adherence to nutrition and physical activity/exercise program aimed toward attainment of established weight goal;Weight Loss: Understanding of general recommendations for a balanced deficit meal plan, which promotes 1-2 lb weight loss per week and includes a negative energy balance of [phone] kcal/d;Understanding recommendations for meals to include 15-35% energy as protein, 25-35% energy from fat, 35-60% energy from carbohydrates, less than 200mg  of dietary cholesterol, 20-35 gm of total fiber daily;Understanding of distribution of calorie intake throughout the day with the consumption of 4-5 meals/snacks    Heart Failure Yes    Intervention Provide a combined exercise and nutrition program that is supplemented with education, support and counseling about heart failure. Directed toward relieving symptoms such as shortness of breath, decreased exercise tolerance, and extremity edema.    Expected Outcomes Improve functional capacity of life;Short term: Daily weights obtained and reported for increase. Utilizing diuretic protocols set by physician.;Short term: Attendance in program 2-3 days a week with increased exercise capacity. Reported lower sodium intake. Reported  increased fruit and vegetable intake. Reports medication compliance.;Long term: Adoption of self-care skills and reduction of barriers for early signs and symptoms recognition and intervention leading to self-care maintenance.    Hypertension Yes    Intervention Provide education on lifestyle modifcations including regular physical activity/exercise, weight management, moderate sodium restriction and increased consumption of fresh fruit, vegetables, and low fat dairy, alcohol moderation, and smoking cessation.;Monitor prescription use compliance.    Expected Outcomes Short Term: Continued assessment and intervention until BP is < 140/83mm HG in hypertensive participants. < 130/11mm HG in hypertensive participants with diabetes, heart failure or chronic kidney disease.;Long Term: Maintenance of blood pressure at goal levels.    Lipids Yes    Intervention Provide education and support for participant on nutrition & aerobic/resistive exercise along with prescribed medications to achieve LDL 70mg , HDL >40mg .    Expected Outcomes Short Term: Participant states understanding of desired cholesterol values and is compliant with medications prescribed. Participant is following exercise prescription and nutrition guidelines.;Long Term: Cholesterol controlled with medications as prescribed, with individualized exercise RX and with personalized nutrition plan. Value goals: LDL < 70mg , HDL > 40 mg.             Education:Diabetes - Individual verbal  and written instruction to review signs/symptoms of diabetes, desired ranges of glucose level fasting, after meals and with exercise. Acknowledge that pre and post exercise glucose checks will be done for 3 sessions at entry of program.   Core Components/Risk Factors/Patient Goals Review:    Core Components/Risk Factors/Patient Goals at Discharge (Final Review):    ITP Comments:  ITP Comments     Row Name 02/25/21 1426 02/26/21 0945         ITP Comments  Initial telephone orientation completed. Diagnosis can be found in [facility] 2/10. EP orientation scheduled for Tuesday 2/21 at 8am. Completed 6MWT and gym orientation. Initial ITP created and sent for review to Dr. Emily Filbert, Medical Director.               Comments: Initial ITP

## 2021-02-26 NOTE — Patient Instructions (Signed)
Patient Instructions  Patient Details  Name: Willie [name] MRN: [id number] Date of Birth: April 19, 1965 Referring Provider:  Nelva Bush, MD  Below are your personal goals for exercise, nutrition, and risk factors. Our goal is to help you stay on track towards obtaining and maintaining these goals. We will be discussing your progress on these goals with you throughout the program.  Initial Exercise Prescription:  Initial Exercise Prescription - 02/26/21 1100       Date of Initial Exercise RX and Referring Provider   Date 02/26/21    Referring Provider End, Harrell Gave MD      Oxygen   Maintain Oxygen Saturation 88% or higher      Treadmill   MPH 2.7    Grade 1    Minutes 15    METs 3.44      Recumbant Bike   Level 3    RPM 60    Watts 25    Minutes 15    METs 3.4      REL-XR   Level 3    Speed 50    Minutes 15    METs 3.4      Track   Laps 37    Minutes 15    METs 3.01      Prescription Details   Frequency (times per week) 3    Duration Progress to 30 minutes of continuous aerobic without signs/symptoms of physical distress      Intensity   THRR 40-80% of Max Heartrate 114-146    Ratings of Perceived Exertion 11-13    Perceived Dyspnea 0-4      Progression   Progression Continue to progress workloads to maintain intensity without signs/symptoms of physical distress.      Resistance Training   Training Prescription Yes    Weight 5 lb    Reps 10-15             Exercise Goals: Frequency: Be able to perform aerobic exercise two to three times per week in program working toward 2-5 days per week of home exercise.  Intensity: Work with a perceived exertion of 11 (fairly light) - 15 (hard) while following your exercise prescription.  We will make changes to your prescription with you as you progress through the program.   Duration: Be able to do 30 to 45 minutes of continuous aerobic exercise in addition to a 5 minute warm-up and a 5 minute cool-down  routine.   Nutrition Goals: Your personal nutrition goals will be established when you do your nutrition analysis with the dietician.  The following are general nutrition guidelines to follow: Cholesterol < 200mg /day Sodium < 1500mg /day Fiber: Men over 50 yrs - 30 grams per day  Personal Goals:  Personal Goals and Risk Factors at Admission - 02/26/21 1206       Core Components/Risk Factors/Patient Goals on Admission    Weight Management Weight Loss;Yes    Intervention Weight Management: Develop a combined nutrition and exercise program designed to reach desired caloric intake, while maintaining appropriate intake of nutrient and fiber, sodium and fats, and appropriate energy expenditure required for the weight goal.;Weight Management: Provide education and appropriate resources to help participant work on and attain dietary goals.;Weight Management/Obesity: Establish reasonable short term and long term weight goals.    Admit Weight 220 lb (99.8 kg)    Goal Weight: Short Term 215 lb (97.5 kg)    Goal Weight: Long Term 200 lb (90.7 kg)    Expected Outcomes Short Term: Continue  to assess and modify interventions until short term weight is achieved;Long Term: Adherence to nutrition and physical activity/exercise program aimed toward attainment of established weight goal;Weight Loss: Understanding of general recommendations for a balanced deficit meal plan, which promotes 1-2 lb weight loss per week and includes a negative energy balance of [phone] kcal/d;Understanding recommendations for meals to include 15-35% energy as protein, 25-35% energy from fat, 35-60% energy from carbohydrates, less than 200mg  of dietary cholesterol, 20-35 gm of total fiber daily;Understanding of distribution of calorie intake throughout the day with the consumption of 4-5 meals/snacks    Heart Failure Yes    Intervention Provide a combined exercise and nutrition program that is supplemented with education, support and  counseling about heart failure. Directed toward relieving symptoms such as shortness of breath, decreased exercise tolerance, and extremity edema.    Expected Outcomes Improve functional capacity of life;Short term: Daily weights obtained and reported for increase. Utilizing diuretic protocols set by physician.;Short term: Attendance in program 2-3 days a week with increased exercise capacity. Reported lower sodium intake. Reported increased fruit and vegetable intake. Reports medication compliance.;Long term: Adoption of self-care skills and reduction of barriers for early signs and symptoms recognition and intervention leading to self-care maintenance.    Hypertension Yes    Intervention Provide education on lifestyle modifcations including regular physical activity/exercise, weight management, moderate sodium restriction and increased consumption of fresh fruit, vegetables, and low fat dairy, alcohol moderation, and smoking cessation.;Monitor prescription use compliance.    Expected Outcomes Short Term: Continued assessment and intervention until BP is < 140/69mm HG in hypertensive participants. < 130/63mm HG in hypertensive participants with diabetes, heart failure or chronic kidney disease.;Long Term: Maintenance of blood pressure at goal levels.    Lipids Yes    Intervention Provide education and support for participant on nutrition & aerobic/resistive exercise along with prescribed medications to achieve LDL 70mg , HDL >40mg .    Expected Outcomes Short Term: Participant states understanding of desired cholesterol values and is compliant with medications prescribed. Participant is following exercise prescription and nutrition guidelines.;Long Term: Cholesterol controlled with medications as prescribed, with individualized exercise RX and with personalized nutrition plan. Value goals: LDL < 70mg , HDL > 40 mg.             Tobacco Use Initial Evaluation: Social History   Tobacco Use  Smoking  Status Former   Packs/day: 0.50   Types: Cigarettes   Quit date: 10/21/1996   Years since quitting: 24.3  Smokeless Tobacco Former   Types: Snuff   Quit date: 04/05/2019    Exercise Goals and Review:  Exercise Goals     Row Name 02/26/21 1205             Exercise Goals   Increase Physical Activity Yes       Intervention Provide advice, education, support and counseling about physical activity/exercise needs.;Develop an individualized exercise prescription for aerobic and resistive training based on initial evaluation findings, risk stratification, comorbidities and participant's personal goals.       Expected Outcomes Short Term: Attend rehab on a regular basis to increase amount of physical activity.;Long Term: Add in home exercise to make exercise part of routine and to increase amount of physical activity.;Long Term: Exercising regularly at least 3-5 days a week.       Increase Strength and Stamina Yes       Intervention Provide advice, education, support and counseling about physical activity/exercise needs.;Develop an individualized exercise prescription for aerobic and resistive training based  on initial evaluation findings, risk stratification, comorbidities and participant's personal goals.       Expected Outcomes Short Term: Increase workloads from initial exercise prescription for resistance, speed, and METs.;Short Term: Perform resistance training exercises routinely during rehab and add in resistance training at home;Long Term: Improve cardiorespiratory fitness, muscular endurance and strength as measured by increased METs and functional capacity (6MWT)       Able to understand and use rate of perceived exertion (RPE) scale Yes       Intervention Provide education and explanation on how to use RPE scale       Expected Outcomes Short Term: Able to use RPE daily in rehab to express subjective intensity level;Long Term:  Able to use RPE to guide intensity level when exercising  independently       Able to understand and use Dyspnea scale Yes       Intervention Provide education and explanation on how to use Dyspnea scale       Expected Outcomes Short Term: Able to use Dyspnea scale daily in rehab to express subjective sense of shortness of breath during exertion;Long Term: Able to use Dyspnea scale to guide intensity level when exercising independently       Knowledge and understanding of Target Heart Rate Range (THRR) Yes       Intervention Provide education and explanation of THRR including how the numbers were predicted and where they are located for reference       Expected Outcomes Short Term: Able to state/look up THRR;Long Term: Able to use THRR to govern intensity when exercising independently;Short Term: Able to use daily as guideline for intensity in rehab       Able to check pulse independently Yes       Intervention Provide education and demonstration on how to check pulse in carotid and radial arteries.;Review the importance of being able to check your own pulse for safety during independent exercise       Expected Outcomes Short Term: Able to explain why pulse checking is important during independent exercise;Long Term: Able to check pulse independently and accurately       Understanding of Exercise Prescription Yes       Intervention Provide education, explanation, and written materials on patient's individual exercise prescription       Expected Outcomes Short Term: Able to explain program exercise prescription;Long Term: Able to explain home exercise prescription to exercise independently                Copy of goals given to participant.

## 2021-02-26 NOTE — Progress Notes (Signed)
Cardiology Office Note    Date:  02/27/2021   ID:  BRIXON ZHEN, DOB May 17, 1965, MRN [id number]  PCP:  Tracie Harrier, MD  Cardiologist:  Ida Rogue, MD  Electrophysiologist:  None   Chief Complaint: Hospital follow-up  History of Present Illness:   AADYN BUCHHEIT is a 56 y.o. male with history of CAD status post three-vessel CABG in 2013 status post PCI/DES with overlapping drug-eluting stents to the native RCA on 02/15/2021, chronic combined systolic and diastolic CHF, LBBB, carotid artery disease, COVID, Hodgkin's lymphoma status post chemoradiation, prior tobacco use, and HLD who presents for hospital follow-up after diagnostic R/LHC.   He was previously followed by outside cardiology group.  LHC in 2013 that revealed multivessel CAD with recommendation for surgical revascularization.  He subsequently underwent three-vessel CABG at [facility] in 10/2011 with a LIMA to LAD, RIMA to distal RCA, and left radial to OM2.  EF noted to be 30% prior to her bypass surgery.  Lexiscan MPI in 08/2015 was without evidence of ischemia.  Echo in 2018 demonstrated a persistent cardiomyopathy with an EF of 20 to 25%, diffuse hypokinesis, grade 1 diastolic dysfunction, mildly dilated aortic root, mild mitral vegetation, mildly dilated left atrium, mildly dilated RV with moderately reduced RV systolic function.  Escalation of GDMT has been limited by orthostasis and breast tenderness on MRA.  He was previously evaluated by EP for consideration of ICD given his cardiomyopathy.  Cardiac MRI in 02/2017 demonstrated an improvement in his LV systolic function with an EF of 43% with minimal coronary pattern of LGE.  Given improvement in his LV systolic function, ICD was deferred.  Echo in 04/2019 showed an EF of 25 to 30%, global hypokinesis, grade 2 diastolic dysfunction, moderately reduced RV systolic function with normal ventricular cavity size, normal PASP, trivial mitral regurgitation, mild dilatation of the  aortic root measuring 39 mm.  He was seen in 01/2021 by his primary cardiologist noting an increase in exertional shortness of breath.  It was noted he had recently had Covid 3 weeks prior.  Most recent echo from 02/2021 showed a persistent cardiomyopathy with an EF of 20 to 25%, global hypokinesis, grade 2 diastolic dysfunction, normal RV systolic function and ventricular cavity size, moderately dilated left atrium, mild to moderate mitral regurgitation, borderline dilatation of the aortic root measuring 39 mm.  Given progressive exertional dyspnea symptoms, and in the context of his persistent cardiomyopathy, he underwent diagnostic R/LHC on 02/15/2021 which demonstrated severe native vessel CAD including 90% mid/distal left main stenosis, 50% ostial RCA stenosis, and a long segment of proximal through mid RCA disease of up to 90%.  Widely patent LIMA to LAD and radial to OM2 bypass grafts.  Atretic/chronically occluded RIMA to RPDA graft.  Mildly elevated left heart filling pressures.  Severely elevated right heart filling pressures.  Equalization of end-diastolic pressures raised the possibility of restrictive/constrictive physiology.  Low normal to mildly reduced cardiac output.  He underwent successful PCI/DES to the proximal/mid RCA using overlapping drug-eluting stents.    He comes in doing well from a cardiac perspective and is without symptoms of angina or decompensation.  He is tolerating all cardiac medications without issues.  No lower extremity swelling, abdominal distention, or orthopnea.  No falls, hematochezia, or melena.  He is adherent and tolerating all medications without issues.  He has noted an increase in urine output following daily administration of furosemide.  His weight remains unchanged.  He is not adding salt to food  and drinking less than 2 L of fluid per day.  He does continue to note some exertional fatigue and dyspnea, however these symptoms are improved following PCI.  He has had  some bruising along the right femoral arteriotomy site, though this is resolving.   Labs independently reviewed: 02/2021 - BUN 21, serum creatinine 1.2, potassium 5.0, Hgb 15.3, PLT 208 11/2020 - albumin 4.3, AST/ALT normal, A1c 5.8, TC 205, TG 138, HDL 34, LDL 143 02/2019 - TSH normal  Past Medical History:  Diagnosis Date   Coronary artery disease    a. 10/2011 Cath: LM 85, LAD 20p, D1 20, LCX 20/76m, OM1 85, RCA 60ost, 62m, 50d; b. 10/2011 CABG x 3: LIMA->LAD, RIMA->dRCA, L Radial->OM2; c. 08/2015 MV: EF 33%, apical ant, apical septal, apical infarct, no ischemia.   Family history of coronary artery disease 10/23/2011   HFrEF (heart failure with reduced ejection fraction) ([place])    a. 02/2017 cMRI: EF 43%, mod MR, minimal LGE, nl RV size/fxn; b. 04/2019 Echo: EF 25-30%, glob HK, gr2 DD, mod reduced RV fxn, triv MR/MR, Ao root 47mm.   Hodgkin's lymphoma ([place]) 1994   neck/midchest area with radiation   Hyperlipidemia 10/23/2011   Hypertension    Ischemic cardiomyopathy    a. 08/2016 Echo: EF 20-25%; b. 02/2017 cMRI: EF 43% (no indication for ICD based on this finding); c. 04/2019 Echo: EF 25-30%.   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   LIMA to LAD, RIMA to RCA, LRA to OM2   S/P radiation therapy 10/21/1992   Radiation therapy to chest and neck for Hodgkin's lymphoma   Unstable angina ([place]) 10/22/2011    Past Surgical History:  Procedure Laterality Date   CARDIAC CATHETERIZATION     CARPAL TUNNEL RELEASE     CORONARY ARTERY BYPASS GRAFT  10/24/2011   Procedure: CORONARY ARTERY BYPASS GRAFTING (CABG);  Surgeon: Rexene Alberts, MD;  Location: [place];  Service: Open Heart Surgery;  Laterality: N/A;  Coronary Artery Bypass Grafting X 3 Using Left Internal  Mammary Artery, Right Internal Mammary Artery and Right Radial Artery   CORONARY STENT INTERVENTION N/A 02/15/2021   Procedure: CORONARY STENT INTERVENTION;  Surgeon: Nelva Bush, MD;  Location: [place] [facility];  Service: Cardiovascular;  Laterality: N/A;   LYMPH NODE BIOPSY  1994   left neck   RADIAL ARTERY HARVEST  10/24/2011   Procedure: RADIAL ARTERY HARVEST;  Surgeon: Rexene Alberts, MD;  Location: [place];  Service: Vascular;  Laterality: Left;   RIGHT/LEFT HEART CATH AND CORONARY/GRAFT ANGIOGRAPHY N/A 02/15/2021   Procedure: RIGHT/LEFT HEART CATH AND CORONARY/GRAFT ANGIOGRAPHY;  Surgeon: Nelva Bush, MD;  Location: [place] [facility];  Service: Cardiovascular;  Laterality: N/A;    Current Medications: Current Meds  Medication Sig   allopurinol (ZYLOPRIM) 300 MG tablet Take 300 mg by mouth in the morning.   aspirin EC 81 MG tablet Take 81 mg by mouth every evening.   carvedilol (COREG) 3.125 MG tablet Take 1 tablet (3.125 mg total) by mouth 2 (two) times daily.   clopidogrel (PLAVIX) 75 MG tablet Take 1 tablet (75 mg total) by mouth daily with breakfast.   colchicine 0.6 MG tablet Take 0.6 mg by mouth daily as needed (GOUT FLARES).   ENTRESTO 24-26 MG TAKE ONE TABLET BY MOUTH TWICE A DAY   ezetimibe (ZETIA) 10 MG tablet TAKE ONE TABLET BY MOUTH ONE TIME DAILY   FARXIGA 10 MG TABS tablet  TAKE ONE TABLET BY MOUTH EVERY MORNING BEFORE BREAKFAST   furosemide (LASIX) 40 MG tablet Take 1 tablet (40 mg total) by mouth daily.   pantoprazole (PROTONIX) 40 MG tablet Take 40 mg by mouth in the morning.   potassium chloride (KLOR-CON M) 10 MEQ tablet Take 1 tablet (10 mEq total) by mouth daily.   rosuvastatin (CRESTOR) 40 MG tablet Take 1 tablet (40 mg total) by mouth daily.   senna (SENOKOT) 8.6 MG tablet Take 4 tablets by mouth at bedtime.   vitamin B-12 (CYANOCOBALAMIN) 1000 MCG tablet Take 1 tablet (1,000 mcg total) by mouth daily.    Allergies:   Spironolactone   Social History   Socioeconomic History   Marital status: Married    Spouse name: Not on file   Number of children: Not on file   Years of education: Not on file   Highest education level: Not on file   Occupational History   Not on file  Tobacco Use   Smoking status: Former    Packs/day: 0.50    Types: Cigarettes    Quit date: 10/21/1996    Years since quitting: 24.3   Smokeless tobacco: Former    Types: Snuff    Quit date: 04/05/2019  Vaping Use   Vaping Use: Never used  Substance and Sexual Activity   Alcohol use: No   Drug use: No   Sexual activity: Yes  Other Topics Concern   Not on file  Social History Narrative   Not on file   Social Determinants of Health   Financial Resource Strain: Not on file  Food Insecurity: Not on file  Transportation Needs: Not on file  Physical Activity: Not on file  Stress: Not on file  Social Connections: Not on file     Family History:  The patient's family history includes Coronary artery disease in an other family member; Heart attack in his father; Heart disease in his mother.  ROS:   Review of Systems  Constitutional:  Positive for malaise/fatigue. Negative for chills, diaphoresis, fever and weight loss.  HENT:  Negative for congestion.   Eyes:  Negative for discharge and redness.  Respiratory:  Positive for shortness of breath. Negative for cough, sputum production and wheezing.   Cardiovascular:  Negative for chest pain, palpitations, orthopnea, claudication, leg swelling and PND.  Gastrointestinal:  Negative for abdominal pain, blood in stool, heartburn, melena, nausea and vomiting.  Musculoskeletal:  Negative for falls and myalgias.  Skin:  Negative for rash.  Neurological:  Negative for dizziness, tingling, tremors, sensory change, speech change, focal weakness, loss of consciousness and weakness.  Endo/Heme/Allergies:  Bruises/bleeds easily.  Psychiatric/Behavioral:  Negative for substance abuse. The patient is not nervous/anxious.   All other systems reviewed and are negative.   EKGs/Labs/Other Studies Reviewed:    Studies reviewed were summarized above. The additional studies were reviewed today:  [facility]  02/15/2021: Conclusions: Severe native coronary artery disease, including 90% mid/distal LMCA stenosis, 50% ostial RCA stenosis, and a long segment of proximal through mid RCA disease of up to 90%. Widely patent LIMA-LAD and radial-OM2 bypass graft. Atretic/chronically occluded RIMA-RPDA graft. Mildly elevated left heart filling pressures. Severely elevated right heart filling pressures.  Equalization of end-diastolic pressures raises possibility of restrictive/constrictive physiology. Low normal to mildly reduced cardiac output. Successful PCI to proximal-mid RCA using overlapping Onyx frontier 3.5 x 15 mm (proximal) and 3.0 x 38 mm (distal) drug-eluting stents with 0% residual stenosis and TIMI-3 flow.   Recommendations: Overnight observation. Dual  antiplatelet therapy with aspirin and clopidogrel for at least 6 months. Consider escalation of diuresis as soon as tomorrow if renal function is stable. Optimize goal-directed medical therapy for chronic HFrEF, as tolerated. Aggressive secondary prevention of coronary artery disease. ___________  2D echo 02/08/2021: 1. Left ventricular ejection fraction, by estimation, is 20 to 25%. The  left ventricle has severely decreased function. The left ventricle  demonstrates global hypokinesis. The left ventricular internal cavity size  was mildly dilated. Left ventricular  diastolic parameters are consistent with Grade II diastolic dysfunction  (pseudonormalization). The average left ventricular global longitudinal  strain is -9.2 %. The global longitudinal strain is abnormal.   2. Right ventricular systolic function is normal. The right ventricular  size is normal. Tricuspid regurgitation signal is inadequate for assessing  PA pressure.   3. Left atrial size was moderately dilated.   4. The mitral valve is normal in structure. Mild to moderate mitral valve  regurgitation. No evidence of mitral stenosis.   5. The aortic valve is tricuspid. Aortic  valve regurgitation is not  visualized. No aortic stenosis is present.   6. There is borderline dilatation of the aortic root, measuring 39 mm.   7. The inferior vena cava is normal in size with greater than 50%  respiratory variability, suggesting right atrial pressure of 3 mmHg.   Comparison(s): LVEF 25-30%. ___________  2D echo 04/07/2019:  1. Left ventricular ejection fraction, by estimation, is 25 to 30%. The  left ventricle has severely decreased function. The left ventricle  demonstrates global hypokinesis. Left ventricular diastolic parameters are  consistent with Grade II diastolic  dysfunction (pseudonormalization).   2. Right ventricular systolic function is moderately reduced. The right  ventricular size is normal. There is normal pulmonary artery systolic  pressure.   3. The mitral valve is normal in structure. Trivial mitral valve  regurgitation.   4. The aortic valve is grossly normal. Aortic valve regurgitation is not  visualized.   5. Aortic dilatation noted. There is mild dilatation of the aortic root  measuring 39 mm.   6. The inferior vena cava is normal in size with greater than 50%  respiratory variability, suggesting right atrial pressure of 3 mmHg.   Comparison(s): EF 20-25%. ___________  2D echo 08/06/2016: - Left ventricle: The cavity size was normal. There was mild    concentric hypertrophy. Systolic function was severely reduced.    The estimated ejection fraction was in the range of 20% to 25%.    Diffuse hypokinesis. Hypokinesis of the anterior myocardium.    Hypokinesis of the anteroseptal myocardium. Hypokinesis of the    apical myocardium. Doppler parameters are consistent with    abnormal left ventricular relaxation (grade 1 diastolic    dysfunction).  - Aortic root: The aortic root was mildly dilated, 3.6 cm  - Mitral valve: There was mild regurgitation.  - Left atrium: The atrium was mildly dilated.  - Right ventricle: The cavity size was  mildly dilated. Wall    thickness was normal. Systolic function was moderately reduced.  - Pulmonary arteries: Systolic pressure could not be accurately    estimated. __________  Treadmill MPI 09/05/2015: Exercise myocardial perfusion imaging study with no significant  ischemia Fixed region of mildly decreased perfusion of mild to moderate intensity in the mid to apical anterior wall, apical septal region and apical region Anteroseptal and septal wall hypokinesis, EF estimated at 33%  Abnormal wall motion possibly secondary to left bundle branch block Equivocal EKG changes concerning for  ischemia at peak stress or in recovery. Unable to interpret EKG in the setting of left bundle Rare PVCs noted in recovery Target heart rate achieved, 7 METS, exercised for 5 minutes Low risk scan __________  [facility] 10/21/2011: Ostial left main 85% stenosis, proximal LAD 20% stenosis, D1 20% stenosis, proximal LCx 20% stenosis, mid LCx 50% stenosis, OM1 85% stenosis, ostial RCA 60% stenosis, proximal RCA 20% stenosis, mid RCA 20% stenosis, distal RCA 50% stenosis.  Recommendation consult for CABG.    EKG:  EKG is ordered today.  The EKG ordered today demonstrates NSR, 81 bpm, LBBB (known)  Recent Labs: 02/16/2021: Hemoglobin 15.3; Platelets 208 02/21/2021: BUN 21; Creatinine, Ser 1.20; Potassium 5.0; Sodium 140  Recent Lipid Panel    Component Value Date/Time   CHOL 245 (H) 10/23/2011 0550   TRIG 265 (H) 10/23/2011 0550   HDL 26 (L) 10/23/2011 0550   CHOLHDL 9.4 10/23/2011 0550   VLDL 53 (H) 10/23/2011 0550   LDLCALC 166 (H) 10/23/2011 0550    PHYSICAL EXAM:    VS:  BP 120/60 (BP Location: Left Arm, Patient Position: Sitting, Cuff Size: Normal)    Pulse 81    Ht 5\' 8"  (1.727 m)    Wt 220 lb (99.8 kg)    BMI 33.45 kg/m   BMI: Body mass index is 33.45 kg/m.  Physical Exam Vitals reviewed.  Constitutional:      Appearance: He is well-developed.  HENT:     Head: Normocephalic and atraumatic.   Eyes:     General:        Right eye: No discharge.        Left eye: No discharge.  Neck:     Vascular: No JVD.  Cardiovascular:     Rate and Rhythm: Normal rate and regular rhythm.     Heart sounds: Normal heart sounds, S1 normal and S2 normal. Heart sounds not distant. No midsystolic click and no opening snap. No murmur heard.   No friction rub.     Comments: Resolving soft ecchymosis along the right femoral arteriotomy site, without active bleeding, swelling, warmth, erythema, or tenderness to palpation.  No bruit. Pulmonary:     Effort: Pulmonary effort is normal. No respiratory distress.     Breath sounds: Normal breath sounds. No decreased breath sounds, wheezing or rales.  Chest:     Chest wall: No tenderness.  Abdominal:     General: There is no distension.     Palpations: Abdomen is soft.     Tenderness: There is no abdominal tenderness.  Musculoskeletal:     Cervical back: Normal range of motion.     Right lower leg: No edema.     Left lower leg: No edema.  Skin:    General: Skin is warm and dry.     Nails: There is no clubbing.  Neurological:     Mental Status: He is alert and oriented to person, place, and time.  Psychiatric:        Speech: Speech normal.        Behavior: Behavior normal.        Thought Content: Thought content normal.        Judgment: Judgment normal.    Wt Readings from Last 3 Encounters:  02/27/21 220 lb (99.8 kg)  02/26/21 220 lb 11.2 oz (100.1 kg)  02/15/21 214 lb (97.1 kg)     ASSESSMENT & PLAN:   CAD status post CABG status post adequate PCI without angina: He is doing  well without symptoms concerning for chest pain with noted improvement in dyspnea and fatigue following PCI to the native RCA.  Continue DAPT with aspirin and clopidogrel for at least 6 months from date of PCI (02/15/2021).  Aggressive risk factor modification and secondary prevention including continuation of carvedilol, rosuvastatin, and ezetimibe.  Post-cath  instructions.  He will be enrolling in cardiac rehab and may participate at this time.  Chronic combined systolic and diastolic CHF with underlying LBBB: He appears euvolemic and well compensated.  We will obtain a CMP today.  If renal function and potassium allow, we will undergo a challenge of eplerenone (intolerant to spironolactone secondary to breast tenderness).  He has not been titrated on carvedilol or Entresto secondary to noted orthostasis with escalation of these medications.  Therefore, we will continue current doses of carvedilol, [place], Entresto, and furosemide.  Look to escalate GDMT with possible addition of eplerenone as outlined above.  He will be establishing with the advanced heart failure team next week.  He will need a cardiac MRI.  Look to update a limited echo in 3 months time following PCI and optimization of medical therapy.  If his cardiomyopathy persists at that time, recommend EP referral for consideration of CRT-D given his underlying left bundle branch block.  CHF education.  HLD: LDL 143 in 11/2020 with goal being less than 50, in the setting of having ran out of his statin for approximately 6 months.  He has been back on Crestor and Zetia for approximately 2 months.  Recheck lipid panel, direct LDL, and CMP. Escalate lipid therapy as indicated to achieve target LDL.  Would likely look to transition him off of Zetia and initiate PCSK9 inhibitor.  HTN: Blood pressure is well controlled in the office today.  Continue current medical therapy as outlined above.   Disposition: F/u with Dr. Rockey Situ or an APP in 6 weeks.   Medication Adjustments/Labs and Tests Ordered: Current medicines are reviewed at length with the patient today.  Concerns regarding medicines are outlined above. Medication changes, Labs and Tests ordered today are summarized above and listed in the Patient Instructions accessible in Encounters.   Signed, Christell Faith, PA-C 02/27/2021 3:46 PM     [address] [place],  [id number] [phone]

## 2021-02-27 ENCOUNTER — Ambulatory Visit: Payer: Managed Care, Other (non HMO) | Admitting: Physician Assistant

## 2021-02-27 ENCOUNTER — Other Ambulatory Visit: Payer: Self-pay

## 2021-02-27 ENCOUNTER — Encounter: Payer: Self-pay | Admitting: Physician Assistant

## 2021-02-27 ENCOUNTER — Encounter: Payer: Self-pay | Admitting: *Deleted

## 2021-02-27 VITALS — BP 120/60 | HR 81 | Ht 68.0 in | Wt 220.0 lb

## 2021-02-27 DIAGNOSIS — I10 Essential (primary) hypertension: Secondary | ICD-10-CM

## 2021-02-27 DIAGNOSIS — Z951 Presence of aortocoronary bypass graft: Secondary | ICD-10-CM

## 2021-02-27 DIAGNOSIS — E785 Hyperlipidemia, unspecified: Secondary | ICD-10-CM

## 2021-02-27 DIAGNOSIS — I255 Ischemic cardiomyopathy: Secondary | ICD-10-CM | POA: Diagnosis not present

## 2021-02-27 DIAGNOSIS — I5020 Unspecified systolic (congestive) heart failure: Secondary | ICD-10-CM | POA: Diagnosis not present

## 2021-02-27 DIAGNOSIS — I2510 Atherosclerotic heart disease of native coronary artery without angina pectoris: Secondary | ICD-10-CM | POA: Diagnosis not present

## 2021-02-27 DIAGNOSIS — Z955 Presence of coronary angioplasty implant and graft: Secondary | ICD-10-CM

## 2021-02-27 NOTE — Patient Instructions (Signed)
Medication Instructions:  No changes at this time.  *If you need a refill on your cardiac medications before your next appointment, please call your pharmacy*   Lab Work: CBC, CMP, Lipid, and Direct LDL today  If you have labs (blood work) drawn today and your tests are completely normal, you will receive your results only by: [place] (if you have MyChart) OR A paper copy in the mail If you have any lab test that is abnormal or we need to change your treatment, we will call you to review the results.   Testing/Procedures: None   Follow-Up: At [facility], you and your health needs are our priority.  As part of our continuing mission to provide you with exceptional heart care, we have created designated Provider Care Teams.  These Care Teams include your primary Cardiologist (physician) and Advanced Practice Providers (APPs -  Physician Assistants and Nurse Practitioners) who all work together to provide you with the care you need, when you need it.   Your next appointment:   6 week(s)  The format for your next appointment:   In Person  Provider:   Ida Rogue, MD or Christell Faith, PA-C

## 2021-02-27 NOTE — Progress Notes (Signed)
Cardiac Individual Treatment Plan  Patient Details  Name: Willie [name] MRN: [id number] Date of Birth: October 02, 1965 Referring Provider:   Flowsheet Row Cardiac Rehab from 02/26/2021 in [facility] Cardiac and Pulmonary Rehab  Referring Provider End, Harrell Gave MD       Initial Encounter Date:  Flowsheet Row Cardiac Rehab from 02/26/2021 in [facility] Cardiac and Pulmonary Rehab  Date 02/26/21       Visit Diagnosis: Status post coronary artery stent placement  Patient's Home Medications on Admission:  Current Outpatient Medications:    allopurinol (ZYLOPRIM) 300 MG tablet, Take 300 mg by mouth in the morning., Disp: , Rfl:    aspirin EC 81 MG tablet, Take 81 mg by mouth every evening., Disp: , Rfl:    carvedilol (COREG) 3.125 MG tablet, Take 1 tablet (3.125 mg total) by mouth 2 (two) times daily., Disp: 180 tablet, Rfl: 1   clopidogrel (PLAVIX) 75 MG tablet, Take 1 tablet (75 mg total) by mouth daily with breakfast., Disp: 90 tablet, Rfl: 3   colchicine 0.6 MG tablet, Take 0.6 mg by mouth daily as needed (GOUT FLARES)., Disp: , Rfl:    ENTRESTO 24-26 MG, TAKE ONE TABLET BY MOUTH TWICE A DAY, Disp: 180 tablet, Rfl: 0   ezetimibe (ZETIA) 10 MG tablet, TAKE ONE TABLET BY MOUTH ONE TIME DAILY, Disp: 90 tablet, Rfl: 0   FARXIGA 10 MG TABS tablet, TAKE ONE TABLET BY MOUTH EVERY MORNING BEFORE BREAKFAST, Disp: 90 tablet, Rfl: 0   furosemide (LASIX) 40 MG tablet, Take 1 tablet (40 mg total) by mouth daily., Disp: 90 tablet, Rfl: 3   pantoprazole (PROTONIX) 40 MG tablet, Take 40 mg by mouth in the morning., Disp: , Rfl:    potassium chloride (KLOR-CON M) 10 MEQ tablet, Take 1 tablet (10 mEq total) by mouth daily., Disp: 90 tablet, Rfl: 3   rosuvastatin (CRESTOR) 40 MG tablet, Take 1 tablet (40 mg total) by mouth daily., Disp: 90 tablet, Rfl: 3   senna (SENOKOT) 8.6 MG tablet, Take 4 tablets by mouth at bedtime., Disp: , Rfl:    vitamin B-12 (CYANOCOBALAMIN) 1000 MCG tablet, Take 1 tablet (1,000 mcg total)  by mouth daily., Disp: 30 tablet, Rfl: 1  Past Medical History: Past Medical History:  Diagnosis Date   Coronary artery disease    a. 10/2011 Cath: LM 85, LAD 20p, D1 20, LCX 20/52m, OM1 85, RCA 60ost, 72m, 50d; b. 10/2011 CABG x 3: LIMA->LAD, RIMA->dRCA, L Radial->OM2; c. 08/2015 MV: EF 33%, apical ant, apical septal, apical infarct, no ischemia.   Family history of coronary artery disease 10/23/2011   HFrEF (heart failure with reduced ejection fraction) ([place])    a. 02/2017 cMRI: EF 43%, mod MR, minimal LGE, nl RV size/fxn; b. 04/2019 Echo: EF 25-30%, glob HK, gr2 DD, mod reduced RV fxn, triv MR/MR, Ao root 41mm.   Hodgkin's lymphoma ([place]) 1994   neck/midchest area with radiation   Hyperlipidemia 10/23/2011   Hypertension    Ischemic cardiomyopathy    a. 08/2016 Echo: EF 20-25%; b. 02/2017 cMRI: EF 43% (no indication for ICD based on this finding); c. 04/2019 Echo: EF 25-30%.   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   LIMA to LAD, RIMA to RCA, LRA to OM2   S/P radiation therapy 10/21/1992   Radiation therapy to chest and neck for Hodgkin's lymphoma   Unstable angina ([place]) 10/22/2011    Tobacco Use: Social History   Tobacco Use  Smoking Status  Former   Packs/day: 0.50   Types: Cigarettes   Quit date: 10/21/1996   Years since quitting: 24.3  Smokeless Tobacco Former   Types: Snuff   Quit date: 04/05/2019    Labs: Recent Review Flowsheet Data     Labs for ITP Cardiac and Pulmonary Rehab Latest Ref Rng & Units 10/24/2011 10/24/2011 10/24/2011 10/24/2011 10/25/2011   Cholestrol 0 - 200 mg/dL - - - - -   LDLCALC 0 - 99 mg/dL - - - - -   HDL >39 mg/dL - - - - -   Trlycerides <150 mg/dL - - - - -   Hemoglobin A1c <5.7 % - - - - -   PHART 7.350 - 7.450 7.370 7.315(L) 7.318(L) - -   PCO2ART 35.0 - 45.0 mmHg 37.8 42.9 42.4 - -   HCO3 20.0 - 24.0 mEq/L 22.0 21.8 21.7 - -   TCO2 0 - 100 mmol/L 23 23 23 22 24    ACIDBASEDEF 0.0 - 2.0 mmol/L 3.0(H) 4.0(H)  4.0(H) - -   O2SAT % 97.0 97.0 97.0 - -        Exercise Target Goals: Exercise Program Goal: Individual exercise prescription set using results from initial 6 min walk test and THRR while considering  patients activity barriers and safety.   Exercise Prescription Goal: Initial exercise prescription builds to 30-45 minutes a day of aerobic activity, 2-3 days per week.  Home exercise guidelines will be given to patient during program as part of exercise prescription that the participant will acknowledge.   Education: Aerobic Exercise: - Group verbal and visual presentation on the components of exercise prescription. Introduces F.I.T.T principle from ACSM for exercise prescriptions.  Reviews F.I.T.T. principles of aerobic exercise including progression. Written material given at graduation.   Education: Resistance Exercise: - Group verbal and visual presentation on the components of exercise prescription. Introduces F.I.T.T principle from ACSM for exercise prescriptions  Reviews F.I.T.T. principles of resistance exercise including progression. Written material given at graduation.    Education: Exercise & Equipment Safety: - Individual verbal instruction and demonstration of equipment use and safety with use of the equipment. Flowsheet Row Cardiac Rehab from 02/26/2021 in [facility] Cardiac and Pulmonary Rehab  Education need identified 02/26/21  Date 02/26/21  Educator [place]  Instruction Review Code 1- Verbalizes Understanding       Education: Exercise Physiology & General Exercise Guidelines: - Group verbal and written instruction with models to review the exercise physiology of the cardiovascular system and associated critical values. Provides general exercise guidelines with specific guidelines to those with heart or lung disease.    Education: Flexibility, Balance, Mind/Body Relaxation: - Group verbal and visual presentation with interactive activity on the components of exercise  prescription. Introduces F.I.T.T principle from ACSM for exercise prescriptions. Reviews F.I.T.T. principles of flexibility and balance exercise training including progression. Also discusses the mind body connection.  Reviews various relaxation techniques to help reduce and manage stress (i.e. Deep breathing, progressive muscle relaxation, and visualization). Balance handout provided to take home. Written material given at graduation.   Activity Barriers & Risk Stratification:  Activity Barriers & Cardiac Risk Stratification - 02/26/21 0947       Activity Barriers & Cardiac Risk Stratification   Activity Barriers Decreased Ventricular Function    Cardiac Risk Stratification High             6 Minute Walk:  6 Minute Walk     Row Name 02/26/21 1149  6 Minute Walk   Phase Initial     Distance 1335 feet     Walk Time 6 minutes     # of Rest Breaks 0     MPH 2.52     METS 3.41     RPE 7     Perceived Dyspnea  0     VO2 Peak 11.96     Symptoms No     Resting HR 82 bpm     Resting BP 108/64     Resting Oxygen Saturation  98 %     Exercise Oxygen Saturation  during 6 min walk 99 %     Max Ex. HR 109 bpm     Max Ex. BP 116/70     2 Minute Post BP 114/72              Oxygen Initial Assessment:   Oxygen Re-Evaluation:   Oxygen Discharge (Final Oxygen Re-Evaluation):   Initial Exercise Prescription:  Initial Exercise Prescription - 02/26/21 1100       Date of Initial Exercise RX and Referring Provider   Date 02/26/21    Referring Provider End, Harrell Gave MD      Oxygen   Maintain Oxygen Saturation 88% or higher      Treadmill   MPH 2.7    Grade 1    Minutes 15    METs 3.44      Recumbant Bike   Level 3    RPM 60    Watts 25    Minutes 15    METs 3.4      REL-XR   Level 3    Speed 50    Minutes 15    METs 3.4      Track   Laps 37    Minutes 15    METs 3.01      Prescription Details   Frequency (times per week) 3    Duration  Progress to 30 minutes of continuous aerobic without signs/symptoms of physical distress      Intensity   THRR 40-80% of Max Heartrate 114-146    Ratings of Perceived Exertion 11-13    Perceived Dyspnea 0-4      Progression   Progression Continue to progress workloads to maintain intensity without signs/symptoms of physical distress.      Resistance Training   Training Prescription Yes    Weight 5 lb    Reps 10-15             Perform Capillary Blood Glucose checks as needed.  Exercise Prescription Changes:   Exercise Prescription Changes     Row Name 02/26/21 1100             Response to Exercise   Blood Pressure (Admit) 108/64       Blood Pressure (Exercise) 116/70       Blood Pressure (Exit) 114/72       Heart Rate (Admit) 82 bpm       Heart Rate (Exercise) 109 bpm       Heart Rate (Exit) 77 bpm       Oxygen Saturation (Admit) 98 %       Oxygen Saturation (Exercise) 99 %       Rating of Perceived Exertion (Exercise) 7       Perceived Dyspnea (Exercise) 0       Symptoms none       Comments walk test results  Exercise Comments:   Exercise Goals and Review:   Exercise Goals     Row Name 02/26/21 1205             Exercise Goals   Increase Physical Activity Yes       Intervention Provide advice, education, support and counseling about physical activity/exercise needs.;Develop an individualized exercise prescription for aerobic and resistive training based on initial evaluation findings, risk stratification, comorbidities and participant's personal goals.       Expected Outcomes Short Term: Attend rehab on a regular basis to increase amount of physical activity.;Long Term: Add in home exercise to make exercise part of routine and to increase amount of physical activity.;Long Term: Exercising regularly at least 3-5 days a week.       Increase Strength and Stamina Yes       Intervention Provide advice, education, support and counseling  about physical activity/exercise needs.;Develop an individualized exercise prescription for aerobic and resistive training based on initial evaluation findings, risk stratification, comorbidities and participant's personal goals.       Expected Outcomes Short Term: Increase workloads from initial exercise prescription for resistance, speed, and METs.;Short Term: Perform resistance training exercises routinely during rehab and add in resistance training at home;Long Term: Improve cardiorespiratory fitness, muscular endurance and strength as measured by increased METs and functional capacity (6MWT)       Able to understand and use rate of perceived exertion (RPE) scale Yes       Intervention Provide education and explanation on how to use RPE scale       Expected Outcomes Short Term: Able to use RPE daily in rehab to express subjective intensity level;Long Term:  Able to use RPE to guide intensity level when exercising independently       Able to understand and use Dyspnea scale Yes       Intervention Provide education and explanation on how to use Dyspnea scale       Expected Outcomes Short Term: Able to use Dyspnea scale daily in rehab to express subjective sense of shortness of breath during exertion;Long Term: Able to use Dyspnea scale to guide intensity level when exercising independently       Knowledge and understanding of Target Heart Rate Range (THRR) Yes       Intervention Provide education and explanation of THRR including how the numbers were predicted and where they are located for reference       Expected Outcomes Short Term: Able to state/look up THRR;Long Term: Able to use THRR to govern intensity when exercising independently;Short Term: Able to use daily as guideline for intensity in rehab       Able to check pulse independently Yes       Intervention Provide education and demonstration on how to check pulse in carotid and radial arteries.;Review the importance of being able to check your  own pulse for safety during independent exercise       Expected Outcomes Short Term: Able to explain why pulse checking is important during independent exercise;Long Term: Able to check pulse independently and accurately       Understanding of Exercise Prescription Yes       Intervention Provide education, explanation, and written materials on patient's individual exercise prescription       Expected Outcomes Short Term: Able to explain program exercise prescription;Long Term: Able to explain home exercise prescription to exercise independently                Exercise  Goals Re-Evaluation :   Discharge Exercise Prescription (Final Exercise Prescription Changes):  Exercise Prescription Changes - 02/26/21 1100       Response to Exercise   Blood Pressure (Admit) 108/64    Blood Pressure (Exercise) 116/70    Blood Pressure (Exit) 114/72    Heart Rate (Admit) 82 bpm    Heart Rate (Exercise) 109 bpm    Heart Rate (Exit) 77 bpm    Oxygen Saturation (Admit) 98 %    Oxygen Saturation (Exercise) 99 %    Rating of Perceived Exertion (Exercise) 7    Perceived Dyspnea (Exercise) 0    Symptoms none    Comments walk test results             Nutrition:  Target Goals: Understanding of nutrition guidelines, daily intake of sodium 1500mg , cholesterol 200mg , calories 30% from fat and 7% or less from saturated fats, daily to have 5 or more servings of fruits and vegetables.  Education: All About Nutrition: -Group instruction provided by verbal, written material, interactive activities, discussions, models, and posters to present general guidelines for heart healthy nutrition including fat, fiber, MyPlate, the role of sodium in heart healthy nutrition, utilization of the nutrition label, and utilization of this knowledge for meal planning. Follow up email sent as well. Written material given at graduation. Flowsheet Row Cardiac Rehab from 02/26/2021 in [facility] Cardiac and Pulmonary Rehab   Education need identified 02/26/21       Biometrics:  Pre Biometrics - 02/26/21 0946       Pre Biometrics   Height 5\' 10"  (1.778 m)    Weight 220 lb 11.2 oz (100.1 kg)    BMI (Calculated) 31.67    Single Leg Stand 30 seconds              Nutrition Therapy Plan and Nutrition Goals:  Nutrition Therapy & Goals - 02/26/21 0947       Intervention Plan   Intervention Prescribe, educate and counsel regarding individualized specific dietary modifications aiming towards targeted core components such as weight, hypertension, lipid management, diabetes, heart failure and other comorbidities.    Expected Outcomes Short Term Goal: Understand basic principles of dietary content, such as calories, fat, sodium, cholesterol and nutrients.;Short Term Goal: A plan has been developed with personal nutrition goals set during dietitian appointment.;Long Term Goal: Adherence to prescribed nutrition plan.             Nutrition Assessments:  MEDIFICTS Score Key: ?70 Need to make dietary changes  40-70 Heart Healthy Diet ? 40 Therapeutic Level Cholesterol Diet  Flowsheet Row Cardiac Rehab from 02/26/2021 in [facility] Cardiac and Pulmonary Rehab  Picture Your Plate Total Score on Admission 55      Picture Your Plate Scores: <41 Unhealthy dietary pattern with much room for improvement. 41-50 Dietary pattern unlikely to meet recommendations for good health and room for improvement. 51-60 More healthful dietary pattern, with some room for improvement.  >60 Healthy dietary pattern, although there may be some specific behaviors that could be improved.    Nutrition Goals Re-Evaluation:   Nutrition Goals Discharge (Final Nutrition Goals Re-Evaluation):   Psychosocial: Target Goals: Acknowledge presence or absence of significant depression and/or stress, maximize coping skills, provide positive support system. Participant is able to verbalize types and ability to use techniques and skills  needed for reducing stress and depression.   Education: Stress, Anxiety, and Depression - Group verbal and visual presentation to define topics covered.  Reviews how body is impacted by  stress, anxiety, and depression.  Also discusses healthy ways to reduce stress and to treat/manage anxiety and depression.  Written material given at graduation.   Education: Sleep Hygiene -Provides group verbal and written instruction about how sleep can affect your health.  Define sleep hygiene, discuss sleep cycles and impact of sleep habits. Review good sleep hygiene tips.    Initial Review & Psychosocial Screening:  Initial Psych Review & Screening - 02/25/21 1408       Initial Review   Current issues with None Identified      Family Dynamics   Good Support System? Yes   wife     Barriers   Psychosocial barriers to participate in program There are no identifiable barriers or psychosocial needs.;The patient should benefit from training in stress management and relaxation.      Screening Interventions   Interventions Encouraged to exercise;Provide feedback about the scores to participant;To provide support and resources with identified psychosocial needs    Expected Outcomes Short Term goal: Utilizing psychosocial counselor, staff and physician to assist with identification of specific Stressors or current issues interfering with healing process. Setting desired goal for each stressor or current issue identified.;Long Term Goal: Stressors or current issues are controlled or eliminated.;Long Term goal: The participant improves quality of Life and PHQ9 Scores as seen by post scores and/or verbalization of changes;Short Term goal: Identification and review with participant of any Quality of Life or Depression concerns found by scoring the questionnaire.             Quality of Life Scores:   Quality of Life - 02/26/21 0950       Quality of Life   Select Quality of Life      Quality of Life  Scores   Health/Function Pre 28.4 %    Socioeconomic Pre 30 %    Psych/Spiritual Pre 30 %    Family Pre 30 %    GLOBAL Pre 29.27 %            Scores of 19 and below usually indicate a poorer quality of life in these areas.  A difference of  2-3 points is a clinically meaningful difference.  A difference of 2-3 points in the total score of the Quality of Life Index has been associated with significant improvement in overall quality of life, self-image, physical symptoms, and general health in studies assessing change in quality of life.  PHQ-9: Recent Review Flowsheet Data     Depression screen [facility] 2/9 02/26/2021   Decreased Interest 0   Down, Depressed, Hopeless 0   PHQ - 2 Score 0   Altered sleeping 0   Tired, decreased energy 0   Change in appetite 0   Feeling bad or failure about yourself  0   Trouble concentrating 0   Moving slowly or fidgety/restless 0   Suicidal thoughts 0   PHQ-9 Score 0   Difficult doing work/chores Not difficult at all      Interpretation of Total Score  Total Score Depression Severity:  1-4 = Minimal depression, 5-9 = Mild depression, 10-14 = Moderate depression, 15-19 = Moderately severe depression, 20-27 = Severe depression   Psychosocial Evaluation and Intervention:  Psychosocial Evaluation - 02/25/21 1416       Psychosocial Evaluation & Interventions   Interventions Encouraged to exercise with the program and follow exercise prescription    Comments Mr. Lacomb reports feeling well after his stent. He states he is back to his usual routine. He does  not report any stress concerns at this time. His wife is his main support system. He is looking forward to coming to the program to work on stamina    Expected Outcomes Short: attend cardiac rehab for education and exercise. Long: develop and maintain positive habits.    Continue Psychosocial Services  Follow up required by staff             Psychosocial Re-Evaluation:   Psychosocial  Discharge (Final Psychosocial Re-Evaluation):   Vocational Rehabilitation: Provide vocational rehab assistance to qualifying candidates.   Vocational Rehab Evaluation & Intervention:  Vocational Rehab - 02/25/21 1408       Initial Vocational Rehab Evaluation & Intervention   Assessment shows need for Vocational Rehabilitation No             Education: Education Goals: Education classes will be provided on a variety of topics geared toward better understanding of heart health and risk factor modification. Participant will state understanding/return demonstration of topics presented as noted by education test scores.  Learning Barriers/Preferences:  Learning Barriers/Preferences - 02/25/21 1408       Learning Barriers/Preferences   Learning Barriers None    Learning Preferences None             General Cardiac Education Topics:  AED/CPR: - Group verbal and written instruction with the use of models to demonstrate the basic use of the AED with the basic ABC's of resuscitation.   Anatomy and Cardiac Procedures: - Group verbal and visual presentation and models provide information about basic cardiac anatomy and function. Reviews the testing methods done to diagnose heart disease and the outcomes of the test results. Describes the treatment choices: Medical Management, Angioplasty, or Coronary Bypass Surgery for treating various heart conditions including Myocardial Infarction, Angina, Valve Disease, and Cardiac Arrhythmias.  Written material given at graduation. Flowsheet Row Cardiac Rehab from 02/26/2021 in [facility] Cardiac and Pulmonary Rehab  Education need identified 02/26/21       Medication Safety: - Group verbal and visual instruction to review commonly prescribed medications for heart and lung disease. Reviews the medication, class of the drug, and side effects. Includes the steps to properly store meds and maintain the prescription regimen.  Written material given  at graduation.   Intimacy: - Group verbal instruction through game format to discuss how heart and lung disease can affect sexual intimacy. Written material given at graduation..   Know Your Numbers and Heart Failure: - Group verbal and visual instruction to discuss disease risk factors for cardiac and pulmonary disease and treatment options.  Reviews associated critical values for Overweight/Obesity, Hypertension, Cholesterol, and Diabetes.  Discusses basics of heart failure: signs/symptoms and treatments.  Introduces Heart Failure Zone chart for action plan for heart failure.  Written material given at graduation. Flowsheet Row Cardiac Rehab from 02/26/2021 in [facility] Cardiac and Pulmonary Rehab  Education need identified 02/26/21       Infection Prevention: - Provides verbal and written material to individual with discussion of infection control including proper hand washing and proper equipment cleaning during exercise session. Flowsheet Row Cardiac Rehab from 02/26/2021 in [facility] Cardiac and Pulmonary Rehab  Education need identified 02/26/21  Date 02/26/21  Educator [place]  Instruction Review Code 1- Verbalizes Understanding       Falls Prevention: - Provides verbal and written material to individual with discussion of falls prevention and safety. Flowsheet Row Cardiac Rehab from 02/26/2021 in [facility] Cardiac and Pulmonary Rehab  Education need identified 02/26/21  Date 02/26/21  Educator [place]  Instruction Review Code 1- Verbalizes Understanding       Other: -Provides group and verbal instruction on various topics (see comments)   Knowledge Questionnaire Score:  Knowledge Questionnaire Score - 02/26/21 0946       Knowledge Questionnaire Score   Pre Score 24/26: Nutrition, Angina             Core Components/Risk Factors/Patient Goals at Admission:  Personal Goals and Risk Factors at Admission - 02/26/21 1206       Core Components/Risk Factors/Patient Goals on Admission     Weight Management Weight Loss;Yes    Intervention Weight Management: Develop a combined nutrition and exercise program designed to reach desired caloric intake, while maintaining appropriate intake of nutrient and fiber, sodium and fats, and appropriate energy expenditure required for the weight goal.;Weight Management: Provide education and appropriate resources to help participant work on and attain dietary goals.;Weight Management/Obesity: Establish reasonable short term and long term weight goals.    Admit Weight 220 lb (99.8 kg)    Goal Weight: Short Term 215 lb (97.5 kg)    Goal Weight: Long Term 200 lb (90.7 kg)    Expected Outcomes Short Term: Continue to assess and modify interventions until short term weight is achieved;Long Term: Adherence to nutrition and physical activity/exercise program aimed toward attainment of established weight goal;Weight Loss: Understanding of general recommendations for a balanced deficit meal plan, which promotes 1-2 lb weight loss per week and includes a negative energy balance of [phone] kcal/d;Understanding recommendations for meals to include 15-35% energy as protein, 25-35% energy from fat, 35-60% energy from carbohydrates, less than 200mg  of dietary cholesterol, 20-35 gm of total fiber daily;Understanding of distribution of calorie intake throughout the day with the consumption of 4-5 meals/snacks    Heart Failure Yes    Intervention Provide a combined exercise and nutrition program that is supplemented with education, support and counseling about heart failure. Directed toward relieving symptoms such as shortness of breath, decreased exercise tolerance, and extremity edema.    Expected Outcomes Improve functional capacity of life;Short term: Daily weights obtained and reported for increase. Utilizing diuretic protocols set by physician.;Short term: Attendance in program 2-3 days a week with increased exercise capacity. Reported lower sodium intake. Reported  increased fruit and vegetable intake. Reports medication compliance.;Long term: Adoption of self-care skills and reduction of barriers for early signs and symptoms recognition and intervention leading to self-care maintenance.    Hypertension Yes    Intervention Provide education on lifestyle modifcations including regular physical activity/exercise, weight management, moderate sodium restriction and increased consumption of fresh fruit, vegetables, and low fat dairy, alcohol moderation, and smoking cessation.;Monitor prescription use compliance.    Expected Outcomes Short Term: Continued assessment and intervention until BP is < 140/57mm HG in hypertensive participants. < 130/48mm HG in hypertensive participants with diabetes, heart failure or chronic kidney disease.;Long Term: Maintenance of blood pressure at goal levels.    Lipids Yes    Intervention Provide education and support for participant on nutrition & aerobic/resistive exercise along with prescribed medications to achieve LDL 70mg , HDL >40mg .    Expected Outcomes Short Term: Participant states understanding of desired cholesterol values and is compliant with medications prescribed. Participant is following exercise prescription and nutrition guidelines.;Long Term: Cholesterol controlled with medications as prescribed, with individualized exercise RX and with personalized nutrition plan. Value goals: LDL < 70mg , HDL > 40 mg.             Education:Diabetes - Individual verbal  and written instruction to review signs/symptoms of diabetes, desired ranges of glucose level fasting, after meals and with exercise. Acknowledge that pre and post exercise glucose checks will be done for 3 sessions at entry of program.   Core Components/Risk Factors/Patient Goals Review:    Core Components/Risk Factors/Patient Goals at Discharge (Final Review):    ITP Comments:  ITP Comments     Row Name 02/25/21 1426 02/26/21 0945 02/27/21 0935        ITP Comments Initial telephone orientation completed. Diagnosis can be found in [facility] 2/10. EP orientation scheduled for Tuesday 2/21 at 8am. Completed 6MWT and gym orientation. Initial ITP created and sent for review to Dr. Emily Filbert, Medical Director. 30 Day review completed. Medical Director ITP review done, changes made as directed, and signed approval by Medical Director.    new to program              Comments:

## 2021-02-28 ENCOUNTER — Telehealth: Payer: Self-pay | Admitting: *Deleted

## 2021-02-28 DIAGNOSIS — I2510 Atherosclerotic heart disease of native coronary artery without angina pectoris: Secondary | ICD-10-CM

## 2021-02-28 DIAGNOSIS — Z951 Presence of aortocoronary bypass graft: Secondary | ICD-10-CM

## 2021-02-28 DIAGNOSIS — E785 Hyperlipidemia, unspecified: Secondary | ICD-10-CM

## 2021-02-28 LAB — COMPREHENSIVE METABOLIC PANEL
ALT: 30 IU/L (ref 0–44)
AST: 22 IU/L (ref 0–40)
Albumin/Globulin Ratio: 1.6 (ref 1.2–2.2)
Albumin: 4.9 g/dL (ref 3.8–4.9)
Alkaline Phosphatase: 120 IU/L (ref 44–121)
BUN/Creatinine Ratio: 14 (ref 9–20)
BUN: 16 mg/dL (ref 6–24)
Bilirubin Total: 0.3 mg/dL (ref 0.0–1.2)
CO2: 23 mmol/L (ref 20–29)
Calcium: 10.6 mg/dL — ABNORMAL HIGH (ref 8.7–10.2)
Chloride: 98 mmol/L (ref 96–106)
Creatinine, Ser: 1.18 mg/dL (ref 0.76–1.27)
Globulin, Total: 3 g/dL (ref 1.5–4.5)
Glucose: 95 mg/dL (ref 70–99)
Potassium: 5.3 mmol/L — ABNORMAL HIGH (ref 3.5–5.2)
Sodium: 143 mmol/L (ref 134–144)
Total Protein: 7.9 g/dL (ref 6.0–8.5)
eGFR: 72 mL/min/{1.73_m2} (ref >59)

## 2021-02-28 LAB — CBC
Hematocrit: 49.6 % (ref 37.5–51.0)
Hemoglobin: 16.5 g/dL (ref 13.0–17.7)
MCH: 29.5 pg (ref 26.6–33.0)
MCHC: 33.3 g/dL (ref 31.5–35.7)
MCV: 89 fL (ref 79–97)
Platelets: 278 10*3/uL (ref 150–450)
RBC: 5.59 x10E6/uL (ref 4.14–5.80)
RDW: 13.2 % (ref 11.6–15.4)
WBC: 8.7 10*3/uL (ref 3.4–10.8)

## 2021-02-28 LAB — LDL CHOLESTEROL, DIRECT: LDL Direct: 119 mg/dL — ABNORMAL HIGH (ref 0–99)

## 2021-02-28 LAB — LIPID PANEL
Chol/HDL Ratio: 5.9 ratio — ABNORMAL HIGH (ref 0.0–5.0)
Cholesterol, Total: 189 mg/dL (ref 100–199)
HDL: 32 mg/dL — ABNORMAL LOW (ref >39)
LDL Chol Calc (NIH): 107 mg/dL — ABNORMAL HIGH (ref 0–99)
Triglycerides: 291 mg/dL — ABNORMAL HIGH (ref 0–149)
VLDL Cholesterol Cal: 50 mg/dL — ABNORMAL HIGH (ref 5–40)

## 2021-02-28 NOTE — Telephone Encounter (Signed)
Results and recommendations reviewed with patient. Discussed all changes and sent results and summary to patient via my chart. He verbalized understanding of our conversation with no further questions at this time.

## 2021-02-28 NOTE — Telephone Encounter (Signed)
-----   Message from Rise Mu, PA-C sent at 02/28/2021  8:54 AM EST ----- Renal and liver function are normal Potassium is mildly elevated LDL is improved but above goal Triglycerides are elevated, though this was not a fasting sample HDL is low Blood count is normal  Recommendations: -Stop KCl -Continue Lasix and current medications, outside of KCl -Defer trial of eplerenone given mildly elevated potassium -Continue to avoid salt substitutes and seasonings that are high in potassium  -Recheck BMP 1 week or at his follow up in [place] -Calcium mildly elevated, follow up with PCP for further evaluation of this if not already done so -Please refer him to the lipid clinic for consideration of PCSK9 inhibitor  -Continue with cardiac rehab

## 2021-03-04 ENCOUNTER — Other Ambulatory Visit (HOSPITAL_COMMUNITY): Payer: Self-pay

## 2021-03-04 ENCOUNTER — Encounter (HOSPITAL_COMMUNITY): Payer: Self-pay | Admitting: Cardiology

## 2021-03-04 ENCOUNTER — Other Ambulatory Visit: Payer: Self-pay

## 2021-03-04 ENCOUNTER — Ambulatory Visit (HOSPITAL_COMMUNITY)
Admission: RE | Admit: 2021-03-04 | Discharge: 2021-03-04 | Disposition: A | Discharge status: Home / Self Care | Payer: Managed Care, Other (non HMO) | Source: Ambulatory Visit | Attending: Cardiology | Admitting: Cardiology

## 2021-03-04 VITALS — BP 124/80 | HR 89 | Wt 221.4 lb

## 2021-03-04 DIAGNOSIS — Z7902 Long term (current) use of antithrombotics/antiplatelets: Secondary | ICD-10-CM | POA: Insufficient documentation

## 2021-03-04 DIAGNOSIS — Z7984 Long term (current) use of oral hypoglycemic drugs: Secondary | ICD-10-CM | POA: Diagnosis not present

## 2021-03-04 DIAGNOSIS — I5022 Chronic systolic (congestive) heart failure: Secondary | ICD-10-CM | POA: Diagnosis not present

## 2021-03-04 DIAGNOSIS — I2510 Atherosclerotic heart disease of native coronary artery without angina pectoris: Secondary | ICD-10-CM | POA: Diagnosis not present

## 2021-03-04 DIAGNOSIS — Z7982 Long term (current) use of aspirin: Secondary | ICD-10-CM | POA: Insufficient documentation

## 2021-03-04 DIAGNOSIS — Z9221 Personal history of antineoplastic chemotherapy: Secondary | ICD-10-CM | POA: Insufficient documentation

## 2021-03-04 DIAGNOSIS — Z951 Presence of aortocoronary bypass graft: Secondary | ICD-10-CM | POA: Insufficient documentation

## 2021-03-04 DIAGNOSIS — E785 Hyperlipidemia, unspecified: Secondary | ICD-10-CM | POA: Diagnosis not present

## 2021-03-04 DIAGNOSIS — N644 Mastodynia: Secondary | ICD-10-CM | POA: Diagnosis not present

## 2021-03-04 DIAGNOSIS — I255 Ischemic cardiomyopathy: Secondary | ICD-10-CM | POA: Diagnosis not present

## 2021-03-04 DIAGNOSIS — I447 Left bundle-branch block, unspecified: Secondary | ICD-10-CM | POA: Insufficient documentation

## 2021-03-04 DIAGNOSIS — Z79899 Other long term (current) drug therapy: Secondary | ICD-10-CM | POA: Insufficient documentation

## 2021-03-04 DIAGNOSIS — Z8249 Family history of ischemic heart disease and other diseases of the circulatory system: Secondary | ICD-10-CM | POA: Insufficient documentation

## 2021-03-04 DIAGNOSIS — Z955 Presence of coronary angioplasty implant and graft: Secondary | ICD-10-CM | POA: Diagnosis not present

## 2021-03-04 DIAGNOSIS — Z8571 Personal history of Hodgkin lymphoma: Secondary | ICD-10-CM | POA: Insufficient documentation

## 2021-03-04 DIAGNOSIS — E781 Pure hyperglyceridemia: Secondary | ICD-10-CM | POA: Diagnosis not present

## 2021-03-04 DIAGNOSIS — Z923 Personal history of irradiation: Secondary | ICD-10-CM | POA: Insufficient documentation

## 2021-03-04 LAB — BASIC METABOLIC PANEL
Anion gap: 11 (ref 5–15)
BUN: 15 mg/dL (ref 6–20)
CO2: 23 mmol/L (ref 22–32)
Calcium: 9.7 mg/dL (ref 8.9–10.3)
Chloride: 106 mmol/L (ref 98–111)
Creatinine, Ser: 1.25 mg/dL — ABNORMAL HIGH (ref 0.61–1.24)
GFR, Estimated: >60 mL/min (ref >60)
Glucose, Bld: 90 mg/dL (ref 70–99)
Potassium: 4.5 mmol/L (ref 3.5–5.1)
Sodium: 140 mmol/L (ref 135–145)

## 2021-03-04 LAB — BRAIN NATRIURETIC PEPTIDE: B Natriuretic Peptide: 56.5 pg/mL (ref 0.0–100.0)

## 2021-03-04 MED ORDER — EPLERENONE 25 MG PO TABS: 25.0000 mg | ORAL_TABLET | Freq: Every day | ORAL | 11 refills | Status: DC

## 2021-03-04 MED ORDER — ICOSAPENT ETHYL 1 G PO CAPS: 2.0000 g | ORAL_CAPSULE | Freq: Two times a day (BID) | ORAL | 11 refills | Status: DC

## 2021-03-04 NOTE — Progress Notes (Signed)
PCP: Tracie Harrier, MD Cardiology: Dr. Rockey Situ HF Cardiology: Dr. Aundra Dubin  56 y.o. with history of CAD s/p CABG, ischemic cardiomyopathy, and prior Hodgkins lymphoma was referred by Dr. Rockey Situ for CHF clinic evaluation.  Patient had Hodgkins disease diagnosed in 1994 and was treated with chemotherapy and radiation.  A strong family history of CAD and also the chest wall radiation with Hodgkins treatment likely predisposed him to premature CAD.  He had CABG in 2013. Echo at the time of CABG showed EF 30%.  EF has been low since that time except for cMRI done in 2/19 which showed EF 43%.  Most recent echo in 2/23 showed EF 20-25%, normal RV.  He had worsening dyspnea as well as chest pain in 1/23 and 2/23.  Cath was done, showing 90% proximal-mid RCA stenosis with atretic RIMA-PDA.  This was treated with DES x 2.  RHC showed prominent RV failure.    Since PCI, he has been doing somewhat better symptomatically.  No further chest pain.  He is not short of breath walking on flat ground. He is short of breath with stairs and with lifting/carrying heavy objects. No orthopnea/PND.  No lightheadedness.    Labs (2/23): LDL 119, TGs 291, K 5.3 (stopped KCl), creatinine 1.18  ECG (personally reviewed): NSR, LBBB 162 msec  PMH: 1. Hodgkins disease: Treated with chemotherapy and chest radiation in 1994.  2. CAD: CABG x 3 in 2013 with LIMA-LAD, RIMA-RCA, free radial-OM2.  - LHC (2/23): 90% proximal-mid RCA stenosis with atretic RIMA-RCA, patient had DES x 2 to proximal and mid RCA.  3. H/o COVID 4. Hyperlipidemia 5. Gout 6. Chronic systolic CHF: Ischemic cardiomyopathy.   - Echo (10/13): EF 30% - Echo (2018): EF 20-25%, moderate RV dysfunction.  - Cardiac MRI (2/19): EF 43%, minimal coronary pattern LGE.   - Echo (4/21): EF 20-25%, normal RV - Echo (2/23): EF 20-25%, normal RV - RHC (2/23): mean RA 18, PA 33/18 mean 23, PAPI < 1, CI 2.0 Fick/2.5 thermo.   Social History   Socioeconomic History    Marital status: Married    Spouse name: Not on file   Number of children: Not on file   Years of education: Not on file   Highest education level: Not on file  Occupational History   Not on file  Tobacco Use   Smoking status: Former    Packs/day: 0.50    Types: Cigarettes    Quit date: 10/21/1996    Years since quitting: 24.3   Smokeless tobacco: Former    Types: Snuff    Quit date: 04/05/2019  Vaping Use   Vaping Use: Never used  Substance and Sexual Activity   Alcohol use: No   Drug use: No   Sexual activity: Yes  Other Topics Concern   Not on file  Social History Narrative   Not on file   Social Determinants of Health   Financial Resource Strain: Not on file  Food Insecurity: Not on file  Transportation Needs: Not on file  Physical Activity: Not on file  Stress: Not on file  Social Connections: Not on file  Intimate Partner Violence: Not on file   Family History  Problem Relation Age of Onset   Coronary artery disease Other    Heart disease Mother    Heart attack Father    ROS: All systems reviewed and negative except as per HPI.   Current Outpatient Medications  Medication Sig Dispense Refill   allopurinol (ZYLOPRIM) 300 MG tablet  Take 300 mg by mouth in the morning.     aspirin EC 81 MG tablet Take 81 mg by mouth every evening.     carvedilol (COREG) 3.125 MG tablet Take 1 tablet (3.125 mg total) by mouth 2 (two) times daily. 180 tablet 1   clopidogrel (PLAVIX) 75 MG tablet Take 1 tablet (75 mg total) by mouth daily with breakfast. 90 tablet 3   colchicine 0.6 MG tablet Take 0.6 mg by mouth daily as needed (GOUT FLARES).     ENTRESTO 24-26 MG TAKE ONE TABLET BY MOUTH TWICE A DAY 180 tablet 0   eplerenone (INSPRA) 25 MG tablet Take 1 tablet (25 mg total) by mouth at bedtime. 30 tablet 11   ezetimibe (ZETIA) 10 MG tablet TAKE ONE TABLET BY MOUTH ONE TIME DAILY 90 tablet 0   FARXIGA 10 MG TABS tablet TAKE ONE TABLET BY MOUTH EVERY MORNING BEFORE BREAKFAST 90  tablet 0   furosemide (LASIX) 40 MG tablet Take 20 mg by mouth every other day.     icosapent Ethyl (VASCEPA) 1 g capsule Take 2 capsules (2 g total) by mouth 2 (two) times daily. 120 capsule 11   pantoprazole (PROTONIX) 40 MG tablet Take 40 mg by mouth in the morning.     rosuvastatin (CRESTOR) 40 MG tablet Take 1 tablet (40 mg total) by mouth daily. 90 tablet 3   senna (SENOKOT) 8.6 MG tablet Take 4 tablets by mouth at bedtime.     vitamin B-12 (CYANOCOBALAMIN) 1000 MCG tablet Take 1 tablet (1,000 mcg total) by mouth daily. 30 tablet 1   No current facility-administered medications for this encounter.   BP 124/80    Pulse 89    Wt 100.4 kg (221 lb 6.4 oz)    SpO2 98%    BMI 33.66 kg/m  General: NAD Neck: No JVD, no thyromegaly or thyroid nodule.  Lungs: Clear to auscultation bilaterally with normal respiratory effort. CV: Nondisplaced PMI.  Heart regular S1/S2, no S3/S4, no murmur.  No peripheral edema.  No carotid bruit.  Normal pedal pulses.  Abdomen: Soft, nontender, no hepatosplenomegaly, no distention.  Skin: Intact without lesions or rashes.  Neurologic: Alert and oriented x 3.  Psych: Normal affect. Extremities: No clubbing or cyanosis.  HEENT: Normal.   Assessment/Plan: 1. CAD: s/p CABG in 2013 and DES x 2 to native RCA in 2/23 with atretic RIMA-PDA noted. No further chest pain since PCI.  - Continue ASA 81 and Plavix 75.  - Continue Crestor and Zetia. - To [facility] cardiac re[facility] at [facility].  2. Hyperlipidemia: Lipids in 2/23 showed LDL 119 and TGs 291.  - Continue Crestor and Zetia, but want to see LDL < 55 ideally.  I will refer to lipid clinic for [place].  - With high triglycerides, add Vascepa 2 g bid.  3. Chronic systolic CHF: Ischemic cardiomyopathy. He has had persistently low EF, most recent echo in 2/23 with EF 20-25%, normal RV.  Neeses in 2/23 showed prominent RV failure with PAPI < 1.  He is not volume overloaded on exam, NYHA class II symptoms.  Medication titration has  been limited by orthostasis.  - Breast pain with spironolactone, will start eplerenone 25 mg daily.  He had mildly elevated K earlier this month but K supplement was stopped. BMET/BNP today and repeat in 10 days.  - Continue Coreg 3.125 mg bid.  - Continue Entresto 24/26 bid.  - Continue Farxiga 10 mg daily.  - Repeat echo in 5/23.  He has a wide LBBB (162 msec today).  If EF remains low despite recent PCI, he will qualify for CRT-D device.   Followup with HF pharmacist in 3 wks for 2 visits for medication adjustment.   Followup with me with echo in 5/23.   Loralie Champagne 03/04/2021

## 2021-03-04 NOTE — Patient Instructions (Signed)
Medication Changes:  Start Vascepa 2g Twice daily  Start Eplerenone 25 mg at bedtime  Lab Work:  Labs done today, your results will be available in [place], we will contact you for abnormal readings.   Testing/Procedures:  Your physician has requested that you have an echocardiogram. Echocardiography is a painless test that uses sound waves to create images of your heart. It provides your doctor with information about the size and shape of your heart and how well your hearts chambers and valves are working. This procedure takes approximately one hour. There are no restrictions for this procedure.  Repeat lab work in 10 days at [facility]  Referrals:  Heart Failure Pharmacist   Special Instructions // Education:  none  Follow-Up in: 3 months  At the [facility], you and your health needs are our priority. We have a designated team specialized in the treatment of Heart Failure. This Care Team includes your primary Heart Failure Specialized Cardiologist (physician), Advanced Practice Providers (APPs- Physician Assistants and Nurse Practitioners), and Pharmacist who all work together to provide you with the care you need, when you need it.   You may see any of the following providers on your designated Care Team at your next follow up:  Dr Glori Bickers Dr Haynes Kerns, NP Lyda Jester, [place] [facility] [place], [place] Audry Riles, PharmD   Please be sure to bring in all your medications bottles to every appointment.   Need to Contact us:  If you have any questions or concerns before your next appointment please send [place] a message through [place] or call our office at [phone].    TO LEAVE A MESSAGE FOR THE NURSE SELECT OPTION 2, PLEASE LEAVE A MESSAGE INCLUDING: YOUR NAME DATE OF BIRTH CALL BACK NUMBER REASON FOR CALL**this is important as we prioritize the call backs  YOU WILL RECEIVE A CALL BACK THE SAME DAY AS LONG AS YOU  CALL BEFORE 4:00 PM

## 2021-03-06 ENCOUNTER — Encounter: Payer: Managed Care, Other (non HMO) | Attending: Internal Medicine

## 2021-03-06 ENCOUNTER — Other Ambulatory Visit: Payer: Self-pay

## 2021-03-06 DIAGNOSIS — Z955 Presence of coronary angioplasty implant and graft: Secondary | ICD-10-CM | POA: Diagnosis present

## 2021-03-06 NOTE — Progress Notes (Signed)
Daily Session Note ? ?Patient Details  ?Name: Willie [name] ?MRN: [id number] ?Date of Birth: Nov 02, 1965 ?Referring Provider:   ?Flowsheet Row Cardiac Rehab from 02/26/2021 in [facility] Cardiac and Pulmonary Rehab  ?Referring Provider End, Harrell Gave MD  ? ?  ? ? ?Encounter Date: 03/06/2021 ? ?Check In: ? Session Check In - 03/06/21 0735   ? ?  ? Check-In  ? Supervising physician immediately available to respond to emergencies See telemetry face sheet for immediately available ER MD   ? Location [facility]-Cardiac & Pulmonary Rehab   ? Staff Present Birdie Sons, MPA, RN;Amanda Sommer, BA, ACSM CEP, Exercise Physiologist;Joseph [place], [place]   ? Virtual Visit No   ? Medication changes reported     No   ? Fall or balance concerns reported    No   ? Warm-up and Cool-down Performed on first and last piece of equipment   ? Resistance Training Performed Yes   ? VAD Patient? No   ? PAD/SET Patient? No   ?  ? Pain Assessment  ? Currently in Pain? No/denies   ? ?  ?  ? ?  ? ? ? ? ? ?Social History  ? ?Tobacco Use  ?Smoking Status Former  ? Packs/day: 0.50  ? Types: Cigarettes  ? Quit date: 10/21/1996  ? Years since quitting: 24.3  ?Smokeless Tobacco Former  ? Types: Snuff  ? Quit date: 04/05/2019  ? ? ?Goals Met:  ?Independence with exercise equipment ?Exercise tolerated well ?No report of concerns or symptoms today ?Strength training completed today ? ?Goals Unmet:  ?Not Applicable ? ?Comments: First full day of exercise!  Patient was oriented to gym and equipment including functions, settings, policies, and procedures.  Patient's individual exercise prescription and treatment plan were reviewed.  All starting workloads were established based on the results of the 6 minute walk test done at initial orientation visit.  The plan for exercise progression was also introduced and progression will be customized based on patient's performance and goals. ? ? ? ?Dr. Emily Filbert is Medical Director for [place].   ?Dr. Ottie Glazier is Medical Director for [facility] [place] Pulmonary Rehabilitation. ?

## 2021-03-07 DIAGNOSIS — Z955 Presence of coronary angioplasty implant and graft: Secondary | ICD-10-CM | POA: Diagnosis not present

## 2021-03-07 NOTE — Progress Notes (Signed)
Daily Session Note ? ?Patient Details  ?Name: Willie [name] ?MRN: [id number] ?Date of Birth: 12-10-1965 ?Referring Provider:   ?Flowsheet Row Cardiac Rehab from 02/26/2021 in [facility] Cardiac and Pulmonary Rehab  ?Referring Provider End, Harrell Gave MD  ? ?  ? ? ?Encounter Date: 03/07/2021 ? ?Check In: ? Session Check In - 03/07/21 0728   ? ?  ? Check-In  ? Supervising physician immediately available to respond to emergencies See telemetry face sheet for immediately available ER MD   ? Location [facility]-Cardiac & Pulmonary Rehab   ? Staff Present Birdie Sons, MPA, RN;Amanda Sommer, BA, ACSM CEP, Exercise Physiologist   ? Virtual Visit No   ? Medication changes reported     No   ? Fall or balance concerns reported    No   ? Warm-up and Cool-down Performed on first and last piece of equipment   ? Resistance Training Performed Yes   ? VAD Patient? No   ? PAD/SET Patient? No   ?  ? Pain Assessment  ? Currently in Pain? No/denies   ? ?  ?  ? ?  ? ? ? ? ? ?Social History  ? ?Tobacco Use  ?Smoking Status Former  ? Packs/day: 0.50  ? Types: Cigarettes  ? Quit date: 10/21/1996  ? Years since quitting: 24.3  ?Smokeless Tobacco Former  ? Types: Snuff  ? Quit date: 04/05/2019  ? ? ?Goals Met:  ?Independence with exercise equipment ?Exercise tolerated well ?No report of concerns or symptoms today ?Strength training completed today ? ?Goals Unmet:  ?Not Applicable ? ?Comments: Pt able to follow exercise prescription today without complaint.  Will continue to monitor for progression. ? ? ? ?Dr. Emily Filbert is Medical Director for [place].  ?Dr. Ottie Glazier is Medical Director for [facility] Pulmonary Rehabilitation. ?

## 2021-03-08 ENCOUNTER — Other Ambulatory Visit: Payer: Managed Care, Other (non HMO)

## 2021-03-11 ENCOUNTER — Encounter: Payer: Managed Care, Other (non HMO) | Admitting: *Deleted

## 2021-03-11 ENCOUNTER — Other Ambulatory Visit (HOSPITAL_COMMUNITY): Payer: Self-pay | Admitting: *Deleted

## 2021-03-11 ENCOUNTER — Encounter (HOSPITAL_COMMUNITY): Payer: Self-pay | Admitting: Cardiology

## 2021-03-11 ENCOUNTER — Other Ambulatory Visit: Payer: Self-pay

## 2021-03-11 DIAGNOSIS — Z955 Presence of coronary angioplasty implant and graft: Secondary | ICD-10-CM

## 2021-03-11 DIAGNOSIS — I5022 Chronic systolic (congestive) heart failure: Secondary | ICD-10-CM

## 2021-03-11 NOTE — Progress Notes (Signed)
Daily Session Note ? ?Patient Details  ?Name: Willie [name] ?MRN: [id number] ?Date of Birth: 1965/11/24 ?Referring Provider:   ?Flowsheet Row Cardiac Rehab from 02/26/2021 in [facility] Cardiac and Pulmonary Rehab  ?Referring Provider End, Harrell Gave MD  ? ?  ? ? ?Encounter Date: 03/11/2021 ? ?Check In: ? Session Check In - 03/11/21 4037   ? ?  ? Check-In  ? Supervising physician immediately available to respond to emergencies See telemetry face sheet for immediately available ER MD   ? Location [facility]-Cardiac & Pulmonary Rehab   ? Staff Present Heath Lark, RN, BSN, CCRP;Joseph [place], RCP,RRT,BSRT;Ravindra Baranek [place], [place], ACSM CEP, Exercise Physiologist   ? Virtual Visit No   ? Medication changes reported     No   ? Fall or balance concerns reported    No   ? Warm-up and Cool-down Performed on first and last piece of equipment   ? Resistance Training Performed Yes   ? VAD Patient? No   ? PAD/SET Patient? No   ?  ? Pain Assessment  ? Currently in Pain? No/denies   ? ?  ?  ? ?  ? ? ? ? Exercise Prescription Changes - 03/11/21 0700   ? ?  ? Resistance Training  ? Training Prescription Yes   ? Weight 5 lb   ? Reps 10-15   ?  ? Treadmill  ? MPH 2.7   ? Grade 1   ? Minutes 15   ? METs 3.44   ?  ? Recumbant Bike  ? Level 3   ? RPM 60   ? Watts 25   ? Minutes 15   ? METs 3.4   ?  ? REL-XR  ? Level 3   ? Speed 50   ? Minutes 15   ? METs 3.4   ?  ? Track  ? Laps 37   ? Minutes 15   ? METs 3.01   ?  ? Home Exercise Plan  ? Plans to continue exercise at Home (comment)   walking  ? Frequency Add 2 additional days to program exercise sessions.   ? Initial Home Exercises Provided 03/11/21   ? ?  ?  ? ?  ? ? ?Social History  ? ?Tobacco Use  ?Smoking Status Former  ? Packs/day: 0.50  ? Types: Cigarettes  ? Quit date: 10/21/1996  ? Years since quitting: 24.4  ?Smokeless Tobacco Former  ? Types: Snuff  ? Quit date: 04/05/2019  ? ? ?Goals Met:  ?Exercise tolerated well ?Personal goals reviewed ?No report of concerns or symptoms today ? ?Goals Unmet:   ?Not Applicable ? ?Comments: Pt able to follow exercise prescription today without complaint.  Will continue to monitor for progression. ? ?Reviewed home exercise with pt today.  Pt plans to walk at home for exercise.  Reviewed THR, pulse, RPE, sign and symptoms, pulse oximetery and when to call 911 or MD.  Also discussed weather considerations and indoor options.  Pt voiced understanding.  ? ? ? ?Dr. Emily Filbert is Medical Director for [place].  ?Dr. Ottie Glazier is Medical Director for [facility] Pulmonary Rehabilitation. ?

## 2021-03-11 NOTE — Progress Notes (Signed)
Daily Session Note ? ?Patient Details  ?Name: Willie [name] ?MRN: [id number] ?Date of Birth: 04/02/1965 ?Referring Provider:   ?Flowsheet Row Cardiac Rehab from 02/26/2021 in [facility] Cardiac and Pulmonary Rehab  ?Referring Provider End, Harrell Gave MD  ? ?  ? ? ?Encounter Date: 03/11/2021 ? ?Check In: ? Session Check In - 03/11/21 8341   ? ?  ? Check-In  ? Supervising physician immediately available to respond to emergencies See telemetry face sheet for immediately available ER MD   ? Location [facility]-Cardiac & Pulmonary Rehab   ? Staff Present Heath Lark, RN, BSN, CCRP;Joseph [place], RCP,RRT,BSRT;Kelly [place], [place], ACSM CEP, Exercise Physiologist   ? Virtual Visit No   ? Medication changes reported     No   ? Fall or balance concerns reported    No   ? Warm-up and Cool-down Performed on first and last piece of equipment   ? Resistance Training Performed Yes   ? VAD Patient? No   ? PAD/SET Patient? No   ?  ? Pain Assessment  ? Currently in Pain? No/denies   ? ?  ?  ? ?  ? ? ? ? Exercise Prescription Changes - 03/11/21 0700   ? ?  ? Resistance Training  ? Training Prescription Yes   ? Weight 5 lb   ? Reps 10-15   ?  ? Treadmill  ? MPH 2.7   ? Grade 1   ? Minutes 15   ? METs 3.44   ?  ? Recumbant Bike  ? Level 3   ? RPM 60   ? Watts 25   ? Minutes 15   ? METs 3.4   ?  ? REL-XR  ? Level 3   ? Speed 50   ? Minutes 15   ? METs 3.4   ?  ? Track  ? Laps 37   ? Minutes 15   ? METs 3.01   ?  ? Home Exercise Plan  ? Plans to continue exercise at Home (comment)   walking  ? Frequency Add 2 additional days to program exercise sessions.   ? Initial Home Exercises Provided 03/11/21   ? ?  ?  ? ?  ? ? ?Social History  ? ?Tobacco Use  ?Smoking Status Former  ? Packs/day: 0.50  ? Types: Cigarettes  ? Quit date: 10/21/1996  ? Years since quitting: 24.4  ?Smokeless Tobacco Former  ? Types: Snuff  ? Quit date: 04/05/2019  ? ? ?Goals Met:  ?Exercise tolerated well ?Personal goals reviewed ?No report of concerns or symptoms today ? ?Goals Unmet:   ?Not Applicable ? ?Comments: Pt able to follow exercise prescription today without complaint.  Will continue to monitor for progression. ? ? ? ?Dr. Emily Filbert is Medical Director for [place].  ?Dr. Ottie Glazier is Medical Director for [facility] Pulmonary Rehabilitation. ?

## 2021-03-13 ENCOUNTER — Other Ambulatory Visit: Payer: Self-pay

## 2021-03-13 ENCOUNTER — Other Ambulatory Visit
Admission: RE | Admit: 2021-03-13 | Discharge: 2021-03-13 | Disposition: A | Discharge status: Home / Self Care | Payer: Managed Care, Other (non HMO) | Source: Ambulatory Visit | Attending: Cardiology | Admitting: Cardiology

## 2021-03-13 DIAGNOSIS — I5022 Chronic systolic (congestive) heart failure: Secondary | ICD-10-CM | POA: Insufficient documentation

## 2021-03-13 DIAGNOSIS — Z955 Presence of coronary angioplasty implant and graft: Secondary | ICD-10-CM

## 2021-03-13 LAB — BASIC METABOLIC PANEL
Anion gap: 6 (ref 5–15)
BUN: 17 mg/dL (ref 6–20)
CO2: 26 mmol/L (ref 22–32)
Calcium: 9.6 mg/dL (ref 8.9–10.3)
Chloride: 107 mmol/L (ref 98–111)
Creatinine, Ser: 1.38 mg/dL — ABNORMAL HIGH (ref 0.61–1.24)
GFR, Estimated: 60 mL/min — ABNORMAL LOW (ref >60)
Glucose, Bld: 99 mg/dL (ref 70–99)
Potassium: 5.4 mmol/L — ABNORMAL HIGH (ref 3.5–5.1)
Sodium: 139 mmol/L (ref 135–145)

## 2021-03-13 NOTE — Progress Notes (Signed)
Daily Session Note ? ?Patient Details  ?Name: PAVLE WILER ?MRN: [id number] ?Date of Birth: 01-04-1966 ?Referring Provider:   ?Flowsheet Row Cardiac Rehab from 02/26/2021 in [facility] Cardiac and Pulmonary Rehab  ?Referring Provider End, Harrell Gave MD  ? ?  ? ? ?Encounter Date: 03/13/2021 ? ?Check In: ? Session Check In - 03/13/21 0732   ? ?  ? Check-In  ? Supervising physician immediately available to respond to emergencies See telemetry face sheet for immediately available ER MD   ? Location [facility]-Cardiac & Pulmonary Rehab   ? Staff Present Birdie Sons, MPA, RN;Joseph [place], RCP,RRT,BSRT;Jessica [place], MA, RCEP, CCRP, CCET   ? Virtual Visit No   ? Medication changes reported     No   ? Fall or balance concerns reported    No   ? Warm-up and Cool-down Performed on first and last piece of equipment   ? Resistance Training Performed Yes   ? VAD Patient? No   ? PAD/SET Patient? No   ?  ? Pain Assessment  ? Currently in Pain? No/denies   ? ?  ?  ? ?  ? ? ? ? ? ?Social History  ? ?Tobacco Use  ?Smoking Status Former  ? Packs/day: 0.50  ? Types: Cigarettes  ? Quit date: 10/21/1996  ? Years since quitting: 24.4  ?Smokeless Tobacco Former  ? Types: Snuff  ? Quit date: 04/05/2019  ? ? ?Goals Met:  ?Independence with exercise equipment ?Exercise tolerated well ?No report of concerns or symptoms today ?Strength training completed today ? ?Goals Unmet:  ?Not Applicable ? ?Comments: Pt able to follow exercise prescription today without complaint.  Will continue to monitor for progression. ? ? ? ?Dr. Emily Filbert is Medical Director for [place].  ?Dr. Ottie Glazier is Medical Director for [facility] Pulmonary Rehabilitation. ?

## 2021-03-14 ENCOUNTER — Telehealth (HOSPITAL_COMMUNITY): Payer: Self-pay | Admitting: Surgery

## 2021-03-14 NOTE — Telephone Encounter (Signed)
-----   Message from Larey Dresser, MD sent at 03/13/2021  2:22 PM EST ----- ?Follow low K diet, make sure he is not taking any K supplements, repeat BMET on Monday.  ?

## 2021-03-14 NOTE — Telephone Encounter (Signed)
I attempted to reach patient.  I left a message for a return call. ?

## 2021-03-15 ENCOUNTER — Ambulatory Visit: Payer: Managed Care, Other (non HMO) | Admitting: Pharmacist

## 2021-03-15 ENCOUNTER — Other Ambulatory Visit: Payer: Self-pay

## 2021-03-15 ENCOUNTER — Other Ambulatory Visit (HOSPITAL_COMMUNITY): Payer: Self-pay

## 2021-03-15 ENCOUNTER — Encounter (HOSPITAL_COMMUNITY): Payer: Self-pay | Admitting: Cardiology

## 2021-03-15 DIAGNOSIS — E7800 Pure hypercholesterolemia, unspecified: Secondary | ICD-10-CM

## 2021-03-15 NOTE — Patient Instructions (Signed)
Your LDL cholesterol is 119 and your goal is < 55 ? ?Continue taking rosuvastatin, ezetimibe and Vascepa for your cholesterol ? ?I will submit information to your insurance for Repatha and let you know when I hear back.  ?  ?Repatha is a subcutaneous injection given once every 2 weeks in the fatty tissue of your stomach or upper outer thigh. Store the medication in the fridge. You can let your dose warm up to room temperature for 30 minutes before injecting if you prefer. Repatha will lower your LDL cholesterol by 60% and helps to lower your chance of having a heart attack or stroke. ? ?Have Dr Aundra Dubin recheck your cholesterol when you see him in May ? ?

## 2021-03-15 NOTE — Progress Notes (Signed)
Patient ID: [name]                 DOB: August 29, 1965                    MRN: [id number] ? ? ? ? ?HPI: ?Willie [name] is a 56 y.o. male patient referred to lipid clinic by Willie Faith, PA and Dr Willie [name]. PMH is significant for CAD s/p 3v CABG in 2013, PCI with overlapping DES to native proximal/mid RCA 02/2021, chronic combined systolic and diastolic CHF with most recent EF of 20-25% and G2DD on 02/2021 echo, LBBB, Hodgkin's lymphoma s/p chemoradiation, prior tobacco use, and HLD.  ? ?He was recently started on Vascepa at HF clinic on 2/27. Presents today with his wife for follow up. Has started on Vascepa, no copay on his med. Tolerating rosuvastatin and ezetimibe well. Started cardiac rehab this week. ? ?Current Medications: rosuvastatin '40mg'$  daily, ezetimibe '10mg'$  daily, Vascepa 2g BID ?Risk Factors: premature and progressive ASCVD, prior tobacco use, FHx CAD, CHF, chemoradiation ?LDL goal: '55mg'$ /dL ? ?Diet: Doesn't add salt to food, drinks < 2L of fluid a day. 2 sweet tea a day, Gatorade ? ?Exercise: Cardiac rehab ? ?Family History: MI in his father, heart disease in his mother. ? ?Social History: Former tobacco use 1/2 PPD, quit in 1998, quit snuff in 2021, denies alcohol and drug use. ? ?Labs: ?02/27/21: TC 189, TG 291, HDL 32, direct LDL 119 (not fasting, on rosuvastatin '40mg'$  daily and ezetimibe '10mg'$  daily) ? ?Past Medical History:  ?Diagnosis Date  ? Coronary artery disease   ? a. 10/2011 Cath: LM 85, LAD 20p, D1 20, LCX 20/31m OM1 85, RCA 60ost, 237m50d; b. 10/2011 CABG x 3: LIMA->LAD, RIMA->dRCA, L Radial->OM2; c. 08/2015 MV: EF 33%, apical ant, apical septal, apical infarct, no ischemia.  ? Family history of coronary artery disease 10/23/2011  ? [place]FrEF (heart [place] with reduced ejection fraction) [place]  ? a. 02/2017 cMRI: EF 43%, mod MR, minimal LGE, nl RV size/fxn; b. 04/2019 Echo: EF 25-30%, glob HK, gr2 DD, mod reduced RV fxn, triv MR/MR, Ao root 3920m ? Hodgkin's lymphoma [place] [place]  ? neck/midchest  area with radiation  ? Hyperlipidemia 10/23/2011  ? Hypertension   ? Ischemic cardiomyopathy   ? a. 08/2016 Echo: EF 20-25%; b. 02/2017 cMRI: EF 43% (no indication for ICD based on this finding); c. 04/2019 Echo: EF 25-30%.  ? Left main coronary artery disease 10/22/2011  ? S/P CABG x 3 10/24/2011  ? LIMA to LAD, RIMA to RCA, LRA to OM2  ? S/P radiation therapy 10/21/1992  ? Radiation therapy to chest and neck for Hodgkin's lymphoma  ? [place]  ? ? ?Current Outpatient Medications on File Prior to Visit  ?Medication Sig Dispense Refill  ? allopurinol (ZYLOPRIM) 300 MG tablet Take 300 mg by mouth in the morning.    ? aspirin EC 81 MG tablet Take 81 mg by mouth every evening.    ? carvedilol (COREG) 3.125 MG tablet Take 1 tablet (3.125 mg total) by mouth 2 (two) times daily. 180 tablet 1  ? clopidogrel (PLAVIX) 75 MG tablet Take 1 tablet (75 mg total) by mouth daily with breakfast. 90 tablet 3  ? colchicine 0.6 MG tablet Take 0.6 mg by mouth daily as needed (GOUT FLARES).    ? ENTRESTO 24-26 MG TAKE ONE TABLET BY MOUTH TWICE A DAY 180 tablet 0  ? eplerenone (INSPRA) 25 MG tablet  Take 1 tablet (25 mg total) by mouth at bedtime. 30 tablet 11  ? ezetimibe (ZETIA) 10 MG tablet TAKE ONE TABLET BY MOUTH ONE TIME DAILY 90 tablet 0  ? FARXIGA 10 MG TABS tablet TAKE ONE TABLET BY MOUTH EVERY MORNING BEFORE BREAKFAST 90 tablet 0  ? furosemide (LASIX) 40 MG tablet Take 20 mg by mouth every other day.    ? icosapent Ethyl (VASCEPA) 1 g capsule Take 2 capsules (2 g total) by mouth 2 (two) times daily. 120 capsule 11  ? pantoprazole (PROTONIX) 40 MG tablet Take 40 mg by mouth in the morning.    ? rosuvastatin (CRESTOR) 40 MG tablet Take 1 tablet (40 mg total) by mouth daily. 90 tablet 3  ? senna (SENOKOT) 8.6 MG tablet Take 4 tablets by mouth at bedtime.    ? vitamin B-12 (CYANOCOBALAMIN) 1000 MCG tablet Take 1 tablet (1,000 mcg total) by mouth daily. 30 tablet 1  ? ?No current facility-administered medications  on file prior to visit.  ? ? ?Allergies  ?Allergen Reactions  ? Spironolactone Other (See Comments)  ?  Caused Chest swelling/knots around breast area.  ?  ? ? ?Assessment/Plan: ? ?1. Hyperlipidemia - LDL 119 above goal < 55 given premature/progressive ASCVD, currently taking rosuvastatin '40mg'$  daily and ezetimibe '10mg'$  daily. Discussed PCSK9i including expected benefits, injection technique, and side effect profile. Will submit prior authorization for Repatha and follow up with pt once approved. He was provided with $5 activated copay card in clinic today. TG elevated at 291 above goal < 150, although he was not fasting when labs were checked. Has since been started on Vascepa 2g BID. Currently drinking 1-2 sweet tea each day and regular Gatorade, recommended switching to sugar free alternatives. He sees Dr Willie [name] for follow up in May and can have lipids rechecked then. ? ?Willie [name], PharmD, BCACP, CPP ?[id number], [place], [place] [address] ?Phone: [phone]; Fax: [phone] ?03/15/2021 2:56 PM ? ? ? ?

## 2021-03-18 ENCOUNTER — Other Ambulatory Visit: Payer: Self-pay

## 2021-03-18 ENCOUNTER — Encounter: Payer: Managed Care, Other (non HMO) | Admitting: *Deleted

## 2021-03-18 DIAGNOSIS — Z955 Presence of coronary angioplasty implant and graft: Secondary | ICD-10-CM | POA: Diagnosis not present

## 2021-03-18 NOTE — Progress Notes (Signed)
Daily Session Note ? ?Patient Details  ?Name: Willie [name] ?MRN: [id number] ?Date of Birth: 02-10-1965 ?Referring Provider:   ?Flowsheet Row Cardiac Rehab from 02/26/2021 in [facility] Cardiac and Pulmonary Rehab  ?Referring Provider End, Harrell Gave MD  ? ?  ? ? ?Encounter Date: 03/18/2021 ? ?Check In: ? Session Check In - 03/18/21 0805   ? ?  ? Check-In  ? Supervising physician immediately available to respond to emergencies See telemetry face sheet for immediately available ER MD   ? Location [facility]-Cardiac & Pulmonary Rehab   ? Staff Present Heath Lark, RN, BSN, CCRP;Joseph [place], RCP,RRT,BSRT;Kelly [place], [place], ACSM CEP, Exercise Physiologist   ? Virtual Visit No   ? Medication changes reported     No   ? Fall or balance concerns reported    No   ? Warm-up and Cool-down Performed on first and last piece of equipment   ? Resistance Training Performed Yes   ? VAD Patient? No   ? PAD/SET Patient? No   ?  ? Pain Assessment  ? Currently in Pain? No/denies   ? ?  ?  ? ?  ? ? ? ? ? ?Social History  ? ?Tobacco Use  ?Smoking Status Former  ? Packs/day: 0.50  ? Types: Cigarettes  ? Quit date: 10/21/1996  ? Years since quitting: 24.4  ?Smokeless Tobacco Former  ? Types: Snuff  ? Quit date: 04/05/2019  ? ? ?Goals Met:  ?Independence with exercise equipment ?Exercise tolerated well ?No report of concerns or symptoms today ? ?Goals Unmet:  ?Not Applicable ? ?Comments: Pt able to follow exercise prescription today without complaint.  Will continue to monitor for progression. ? ? ? ?Dr. Emily Filbert is Medical Director for [place].  ?Dr. Ottie Glazier is Medical Director for [facility] Pulmonary Rehabilitation. ?

## 2021-03-20 ENCOUNTER — Other Ambulatory Visit: Payer: Self-pay

## 2021-03-20 DIAGNOSIS — Z955 Presence of coronary angioplasty implant and graft: Secondary | ICD-10-CM

## 2021-03-20 NOTE — Progress Notes (Signed)
Daily Session Note ? ?Patient Details  ?Name: BUELL PARCEL ?MRN: [id number] ?Date of Birth: Mar 29, 1965 ?Referring Provider:   ?Flowsheet Row Cardiac Rehab from 02/26/2021 in [facility] Cardiac and Pulmonary Rehab  ?Referring Provider End, Harrell Gave MD  ? ?  ? ? ?Encounter Date: 03/20/2021 ? ?Check In: ? Session Check In - 03/20/21 0727   ? ?  ? Check-In  ? Supervising physician immediately available to respond to emergencies See telemetry face sheet for immediately available ER MD   ? Location [facility]Cardiac & Pulmonary Rehab   ? Staff Present Birdie Sons, MPA, RN;Joseph [place], RCP,RRT,BSRT;Jessica [place], MA, RCEP, CCRP, CCET   ? Virtual Visit No   ? Medication changes reported     No   ? Fall or balance concerns reported    No   ? Warm-up and Cool-down Performed on first and last piece of equipment   ? Resistance Training Performed Yes   ? VAD Patient? No   ? PAD/SET Patient? No   ?  ? Pain Assessment  ? Currently in Pain? No/denies   ? ?  ?  ? ?  ? ? ? ? ? ?Social History  ? ?Tobacco Use  ?Smoking Status Former  ? Packs/day: 0.50  ? Types: Cigarettes  ? Quit date: 10/21/1996  ? Years since quitting: 24.4  ?Smokeless Tobacco Former  ? Types: Snuff  ? Quit date: 04/05/2019  ? ? ?Goals Met:  ?Independence with exercise equipment ?Exercise tolerated well ?No report of concerns or symptoms today ?Strength training completed today ? ?Goals Unmet:  ?Not Applicable ? ?Comments: Pt able to follow exercise prescription today without complaint.  Will continue to monitor for progression. ? ? ? ?Dr. Emily Filbert is Medical Director for [place].  ?Dr. Ottie Glazier is Medical Director for [facility] Pulmonary Rehabilitation. ?

## 2021-03-21 ENCOUNTER — Encounter: Payer: Self-pay | Admitting: Pharmacist

## 2021-03-21 DIAGNOSIS — Z955 Presence of coronary angioplasty implant and graft: Secondary | ICD-10-CM | POA: Diagnosis not present

## 2021-03-21 NOTE — Progress Notes (Signed)
Daily Session Note ? ?Patient Details  ?Name: Willie [name] ?MRN: [id number] ?Date of Birth: 11-18-1965 ?Referring Provider:   ?Flowsheet Row Cardiac Rehab from 02/26/2021 in [facility] Cardiac and [facility]monary Rehab  ?Referring Provider End, Harrell Gave MD  ? ?  ? ? ?Encounter Date: 03/21/2021 ? ?Check In: ? Session Check In - 03/21/21 0713   ? ?  ? Check-In  ? Supervising physician immediately available to respond to emergencies See telemetry face sheet for immediately available ER MD   ? Location [facility]-Cardiac & Pulmonary Rehab   ? Staff Present Birdie Sons, MPA, RN;Geet Hosking Amedeo Plenty, BS, ACSM CEP, Exercise Physiologist;Jessica Luan [place], MA, RCEP, CCRP, CCET   ? Virtual Visit No   ? Medication changes reported     No   ? Fall or balance concerns reported    No   ? Warm-up and Cool-down Performed on first and last piece of equipment   ? Resistance Training Performed Yes   ? VAD Patient? No   ? PAD/SET Patient? No   ?  ? Pain Assessment  ? Currently in Pain? No/denies   ? ?  ?  ? ?  ? ? ? ? ? ?Social History  ? ?Tobacco Use  ?Smoking Status Former  ? Packs/day: 0.50  ? Types: Cigarettes  ? Quit date: 10/21/1996  ? Years since quitting: 24.4  ?Smokeless Tobacco Former  ? Types: Snuff  ? Quit date: 04/05/2019  ? ? ?Goals Met:  ?Independence with exercise equipment ?Exercise tolerated well ?No report of concerns or symptoms today ?Strength training completed today ? ?Goals Unmet:  ?Not Applicable ? ?Comments: Pt able to follow exercise prescription today without complaint.  Will continue to monitor for progression. ? ? ? ?Dr. Emily Filbert is Medical Director for [place].  ?Dr. Ottie Glazier is Medical Director for [facility] Pulmonary Rehabilitation. ?

## 2021-03-22 ENCOUNTER — Ambulatory Visit: Payer: Managed Care, Other (non HMO) | Admitting: Cardiovascular Disease

## 2021-03-22 MED ORDER — REPATHA SURECLICK 140 MG/ML ~~LOC~~ SOAJ
1.0000 | SUBCUTANEOUS | 3 refills | Status: DC
Start: 2021-03-22 — End: 2021-04-12

## 2021-03-25 ENCOUNTER — Encounter: Payer: Managed Care, Other (non HMO) | Admitting: *Deleted

## 2021-03-25 ENCOUNTER — Other Ambulatory Visit: Payer: Self-pay

## 2021-03-25 DIAGNOSIS — Z955 Presence of coronary angioplasty implant and graft: Secondary | ICD-10-CM

## 2021-03-25 NOTE — Progress Notes (Signed)
Daily Session Note ? ?Patient Details  ?Name: Willie [name] ?MRN: [id number] ?Date of Birth: 05/31/1965 ?Referring Provider:   ?Flowsheet Row Cardiac Rehab from 02/26/2021 in [facility] Cardiac and Pulmonary Rehab  ?Referring Provider End, Harrell Gave MD  ? ?  ? ? ?Encounter Date: 03/25/2021 ? ?Check In: ? Session Check In - 03/25/21 0803   ? ?  ? Check-In  ? Supervising physician immediately available to respond to emergencies See telemetry face sheet for immediately available ER MD   ? Location [facility]-Cardiac & Pulmonary Rehab   ? Staff Present Justin Mend, RCP,RRT,BSRT;Heath Lark, RN, BSN, [employer], BS, ACSM CEP, Exercise Physiologist   ? Virtual Visit No   ? Medication changes reported     No   ? Fall or balance concerns reported    No   ? Warm-up and Cool-down Performed on first and last piece of equipment   ? Resistance Training Performed Yes   ? VAD Patient? No   ? PAD/SET Patient? No   ?  ? Pain Assessment  ? Currently in Pain? No/denies   ? ?  ?  ? ?  ? ? ? ? ? ?Social History  ? ?Tobacco Use  ?Smoking Status Former  ? Packs/day: 0.50  ? Types: Cigarettes  ? Quit date: 10/21/1996  ? Years since quitting: 24.4  ?Smokeless Tobacco Former  ? Types: Snuff  ? Quit date: 04/05/2019  ? ? ?Goals Met:  ?Independence with exercise equipment ?Exercise tolerated well ?No report of concerns or symptoms today ? ?Goals Unmet:  ?Not Applicable ? ?Comments: Pt able to follow exercise prescription today without complaint.  Will continue to monitor for progression. ? ? ? ?Dr. Emily Filbert is Medical Director for [place].  ?Dr. Ottie Glazier is Medical Director for [facility] Pulmonary Rehabilitation. ?

## 2021-03-26 ENCOUNTER — Encounter: Payer: Self-pay | Admitting: Pharmacist

## 2021-03-27 ENCOUNTER — Other Ambulatory Visit: Payer: Self-pay

## 2021-03-27 ENCOUNTER — Encounter: Payer: Self-pay | Admitting: *Deleted

## 2021-03-27 DIAGNOSIS — Z955 Presence of coronary angioplasty implant and graft: Secondary | ICD-10-CM

## 2021-03-27 NOTE — Progress Notes (Incomplete)
***In Progress*** ? ?  ?Advanced Heart Failure Clinic Note  ? ?PCP: Tracie Harrier, MD ?Cardiology: Dr. Rockey Situ ?HF Cardiology: Dr. Aundra Dubin ?  ?56 y.o. with history of CAD s/p CABG, ischemic cardiomyopathy, and prior Hodgkins lymphoma was referred by Dr. Rockey Situ for CHF clinic evaluation.  Patient had Hodgkins disease diagnosed in 1994 and was treated with chemotherapy and radiation.  He has a strong family history of CAD and plus the chest wall radiation for Hodgkins treatment likely predisposed him to premature CAD.  He had CABG in 2013. Echo at the time of CABG showed EF 30%.  EF has been low since that time except for cMRI done in 02/2017 which showed EF 43%.    Reported worsening dyspnea as well as chest pain in 01/2021 and 02/2021. Most recent echo in 02/2021 showed EF 20-25%, normal RV. Cath was done, showing 90% proximal-mid RCA stenosis with atretic RIMA-PDA.  This was treated with DES x 2.  RHC showed prominent RV failure.   ?  ?He was last seen by MD on 03/04/2021. He reported since PCI, he had been doing somewhat better symptomatically.  Denied chest pain.Denied SOB walking on flat ground. Endorsed SOB with stairs and with lifting/carrying heavy objects. Denied orthopnea/PND.  Denied lightheadedness.   ? ?Today he returns to HF clinic for pharmacist medication titration. At last visit with MD, he was started on eplerenone 25 mg daily ***due to breast pain. Follow-up BMET showed K of 5.4 and SCR of 1.38 increased from 1.25. ***He was supposed to get repeat labs on Monday but he did not. ? ?Overall feeling ***. ?Dizziness, lightheadedness, fatigue:  ?Chest pain or palpitations: ? ?How is your breathing?: *** ?SOB: ?Able to complete all ADLs. Activity level *** ? ?Weight at home pounds. Takes furosemide/torsemide/bumex 20 mg every other daily.  ?LEE ?PND/Orthopnea ? ?Appetite *** ?Low-salt diet:  ? ?Physical Exam ?Cost/affordability of meds ? ? ?HF Medications: ?Carvedilol 3.125 mg BID ?Entresto 24-26 mg  BID ?Eplerenone 25 mg qhs ?Farxiga 10 mg daily ?Furosemide 20 mg every other day ? ?Has the patient been experiencing any side effects to the medications prescribed?  {YES NO:22349} ? ?Does the patient have any problems obtaining medications due to transportation or finances?   {YES NO:22349} ? ?Understanding of regimen: {excellent/good/fair/poor:19665} ?Understanding of indications: {excellent/good/fair/poor:19665} ?Potential of compliance: {excellent/good/fair/poor:19665} ?Patient understands to avoid NSAIDs. ?Patient understands to avoid decongestants. ?  ? ?Pertinent Lab Values: ?03/13/2021 - Serum creatinine 1.38, BUN 17, Potassium 5.4, Sodium 139,  ?03/04/2021 - BNP 56 ? ?Vital Signs: ?Weight: *** (last clinic weight: 221.4 lbs.) ?Blood pressure: ***  ?Heart rate: ***  ? ?Assessment/Plan: ?Assessment/Plan: ?1. CAD: s/p CABG in 2013 and DES x 2 to native RCA in 02/2021 with atretic RIMA-PDA noted. No further chest pain since PCI.  ?- Continue ASA 81 and Plavix 75.  ?- Continue Crestor, Zetia. ?- Participates in cardiac rehab. ?2. Hyperlipidemia: Lipids in 02/2021 showed LDL 119 and TGs 291. LDL goal of < 55 mg/dL. ?- Continue Crestor, Zetia, and Vascepa, and Repatha. ?3. Chronic systolic CHF: Ischemic cardiomyopathy. He has had persistently low EF, most recent echo in 02/2021 with EF 20-25%, normal RV.  [place] in 02/2021 showed prominent RV failure with PAPI < 1.  He is not volume overloaded on exam, NYHA class II symptoms.  Medication titration has been limited by orthostasis.  ?- Breast pain with spironolactone, will start eplerenone 25 mg daily.  He had mildly elevated K earlier this month but K supplement was  stopped. BMET/BNP today and repeat in 10 days.  ?- Continue Coreg 3.125 mg BID.  ?- Increase Entresto 49-51 mg  BID. ?- Continue eplerenone 25 mg daily. ?- Continue Farxiga 10 mg daily.  ?- Repeat echo in 05/2021.  He has a wide LBBB (162 msec on 03/04/2021).  If EF remains low despite recent PCI, he will qualify  for CRT-D device.  ?  ?Followup with pharmacy clinic in 3 weeks ***if we increase his Entresto. Followup with Dr. Aundra Dubin with echo in 5/23. ? ? ?Audry Riles, PharmD, BCPS, BCCP, CPP ?Heart Failure Clinic Pharmacist ?[phone] ? ? ?

## 2021-03-27 NOTE — Progress Notes (Signed)
Daily Session Note ? ?Patient Details  ?Name: Willie [name] ?MRN: [id number] ?Date of Birth: 1965/06/27 ?Referring Provider:   ?Flowsheet Row Cardiac Rehab from 02/26/2021 in [facility] Cardiac and Pulmonary Rehab  ?Referring Provider End, Harrell Gave MD  ? ?  ? ? ?Encounter Date: 03/27/2021 ? ?Check In: ? Session Check In - 03/27/21 0730   ? ?  ? Check-In  ? Supervising physician immediately available to respond to emergencies See telemetry face sheet for immediately available ER MD   ? Location [facility]-Cardiac & Pulmonary Rehab   ? Staff Present Birdie Sons, MPA, RN;Melissa Albee, RDN, LDN;Joseph [place], RCP,RRT,BSRT   ? Virtual Visit No   ? Medication changes reported     No   ? Fall or balance concerns reported    No   ? Warm-up and Cool-down Performed on first and last piece of equipment   ? Resistance Training Performed Yes   ? VAD Patient? No   ? PAD/SET Patient? No   ?  ? Pain Assessment  ? Currently in Pain? No/denies   ? ?  ?  ? ?  ? ? ? ? ? ?Social History  ? ?Tobacco Use  ?Smoking Status Former  ? Packs/day: 0.50  ? Types: Cigarettes  ? Quit date: 10/21/1996  ? Years since quitting: 24.4  ?Smokeless Tobacco Former  ? Types: Snuff  ? Quit date: 04/05/2019  ? ? ?Goals Met:  ?Independence with exercise equipment ?Exercise tolerated well ?No report of concerns or symptoms today ?Strength training completed today ? ?Goals Unmet:  ?Not Applicable ? ?Comments: Pt able to follow exercise prescription today without complaint.  Will continue to monitor for progression. ? ? ? ?Dr. Emily Filbert is Medical Director for [place].  ?Dr. Ottie Glazier is Medical Director for [facility] Pulmonary Rehabilitation. ?

## 2021-03-27 NOTE — Progress Notes (Signed)
Cardiac Individual Treatment Plan ? ?Patient Details  ?Name: Willie [name] ?MRN: [id number] ?Date of Birth: November 14, 1965 ?Referring Provider:   ?Flowsheet Row Cardiac Rehab from 02/26/2021 in [facility] Cardiac and Pulmonary Rehab  ?Referring Provider End, Harrell Gave MD  ? ?  ? ? ?Initial Encounter Date:  ?Flowsheet Row Cardiac Rehab from 02/26/2021 in [facility] Cardiac and Pulmonary Rehab  ?Date 02/26/21  ? ?  ? ? ?Visit Diagnosis: Status post coronary artery stent placement ? ?Patient's Home Medications on Admission: ? ?Current Outpatient Medications:  ?  allopurinol (ZYLOPRIM) 300 MG tablet, Take 300 mg by mouth in the morning., Disp: , Rfl:  ?  aspirin EC 81 MG tablet, Take 81 mg by mouth every evening., Disp: , Rfl:  ?  carvedilol (COREG) 3.125 MG tablet, Take 1 tablet (3.125 mg total) by mouth 2 (two) times daily., Disp: 180 tablet, Rfl: 1 ?  clopidogrel (PLAVIX) 75 MG tablet, Take 1 tablet (75 mg total) by mouth daily with breakfast., Disp: 90 tablet, Rfl: 3 ?  colchicine 0.6 MG tablet, Take 0.6 mg by mouth daily as needed (GOUT FLARES)., Disp: , Rfl:  ?  ENTRESTO 24-26 MG, TAKE ONE TABLET BY MOUTH TWICE A DAY, Disp: 180 tablet, Rfl: 0 ?  eplerenone (INSPRA) 25 MG tablet, Take 1 tablet (25 mg total) by mouth at bedtime., Disp: 30 tablet, Rfl: 11 ?  Evolocumab (REPATHA SURECLICK) 409 MG/ML SOAJ, Inject 1 pen. into the skin every 14 (fourteen) days., Disp: 6 mL, Rfl: 3 ?  ezetimibe (ZETIA) 10 MG tablet, TAKE ONE TABLET BY MOUTH ONE TIME DAILY, Disp: 90 tablet, Rfl: 0 ?  FARXIGA 10 MG TABS tablet, TAKE ONE TABLET BY MOUTH EVERY MORNING BEFORE BREAKFAST, Disp: 90 tablet, Rfl: 0 ?  furosemide (LASIX) 40 MG tablet, Take 20 mg by mouth every other day., Disp: , Rfl:  ?  icosapent Ethyl (VASCEPA) 1 g capsule, Take 2 capsules (2 g total) by mouth 2 (two) times daily., Disp: 120 capsule, Rfl: 11 ?  pantoprazole (PROTONIX) 40 MG tablet, Take 40 mg by mouth in the morning., Disp: , Rfl:  ?  rosuvastatin (CRESTOR) 40 MG tablet, Take 1  tablet (40 mg total) by mouth daily., Disp: 90 tablet, Rfl: 3 ?  senna (SENOKOT) 8.6 MG tablet, Take 4 tablets by mouth at bedtime., Disp: , Rfl:  ?  vitamin B-12 (CYANOCOBALAMIN) 1000 MCG tablet, Take 1 tablet (1,000 mcg total) by mouth daily., Disp: 30 tablet, Rfl: 1 ? ?Past Medical History: ?Past Medical History:  ?Diagnosis Date  ? Coronary artery disease   ? a. 10/2011 Cath: LM 85, LAD 20p, D1 20, LCX 20/66m OM1 85, RCA 60ost, 267m50d; b. 10/2011 CABG x 3: LIMA->LAD, RIMA->dRCA, L Radial->OM2; c. 08/2015 MV: EF 33%, apical ant, apical septal, apical infarct, no ischemia.  ? Family history of coronary artery disease 10/23/2011  ? HFrEF (heart failure with reduced ejection fraction) ([place]  ? a. 02/2017 cMRI: EF 43%, mod MR, minimal LGE, nl RV size/fxn; b. 04/2019 Echo: EF 25-30%, glob HK, gr2 DD, mod reduced RV fxn, triv MR/MR, Ao root 3932m ? Hodgkin's lymphoma ([place]  ? neck/midchest area with radiation  ? Hyperlipidemia 10/23/2011  ? Hypertension   ? Ischemic cardiomyopathy   ? a. 08/2016 Echo: EF 20-25%; b. 02/2017 cMRI: EF 43% (no indication for ICD based on this finding); c. 04/2019 Echo: EF 25-30%.  ? Left main coronary artery disease 10/22/2011  ? S/P CABG x 3 10/24/2011  ? LIMA  to LAD, RIMA to RCA, LRA to OM2  ? S/P radiation therapy 10/21/1992  ? Radiation therapy to chest and neck for Hodgkin's lymphoma  ? Unstable angina ([place]) 10/22/2011  ? ? ?Tobacco Use: ?Social History  ? ?Tobacco Use  ?Smoking Status Former  ? Packs/day: 0.50  ? Types: Cigarettes  ? Quit date: 10/21/1996  ? Years since quitting: 24.4  ?Smokeless Tobacco Former  ? Types: Snuff  ? Quit date: 04/05/2019  ? ? ?Labs: ?Review Flowsheet   ? ?  ?  Latest Ref Rng & Units 10/22/2011 10/23/2011 10/24/2011 10/25/2011  ?Labs for ITP Cardiac and Pulmonary Rehab  ?Cholestrol 100 - 199 mg/dL  245      ?LDL (calc) 0 - 99 mg/dL  166      ?Direct LDL 0 - 99 mg/dL      ?HDL-C >39 mg/dL  26      ?Trlycerides 0 - 149 mg/dL  265      ?Hemoglobin A1c  <5.7 % 5.4       ?PH, Arterial 7.350 - 7.450   7.318    ? 7.315    ? 7.370    ? 7.341    ? 7.372     ?PCO2 arterial 35.0 - 45.0 mmHg   42.4    ? 42.9    ? 37.8    ? 23.8    ? 44.3     ?Bicarbonate 20.0 - 24.0 mEq/L   21.7    ? 21.8    ? 22.0    ? 13.1    ? 25.7     ?TCO2 0 - 100 mmol/L   22    ? 23    ? 23    ? 23    ? 14    ? 27   24    ?Acid-base deficit 0.0 - 2.0 mmol/L   4.0    ? 4.0    ? 3.0    ? 12.0     ?O2 Saturation %   97.0    ? 97.0    ? 97.0    ? 93.0    ? 100.0     ? ?  02/27/2021  ?Labs for ITP Cardiac and Pulmonary Rehab  ?Cholestrol 189    ?LDL (calc) 107    ?Direct LDL 119    ?HDL-C 32    ?Trlycerides 291    ?Hemoglobin A1c   ?PH, Arterial   ?PCO2 arterial   ?Bicarbonate   ?TCO2   ?Acid-base deficit   ?O2 Saturation   ?  ? ? Multiple values from one day are sorted in reverse-chronological order  ?  ?  ? ? ? ?Exercise Target Goals: ?Exercise Program Goal: ?Individual exercise prescription set using results from initial 6 min walk test and THRR while considering  patient?s activity barriers and safety.  ? ?Exercise Prescription Goal: ?Initial exercise prescription builds to 30-45 minutes a day of aerobic activity, 2-3 days per week.  Home exercise guidelines will be given to patient during program as part of exercise prescription that the participant will acknowledge. ? ? ?Education: Aerobic Exercise: ?- Group verbal and visual presentation on the components of exercise prescription. Introduces F.I.T.T principle from ACSM for exercise prescriptions.  Reviews F.I.T.T. principles of aerobic exercise including progression. Written material given at graduation. ?Flowsheet Row Cardiac Rehab from 03/27/2021 in [facility] Cardiac and Pulmonary Rehab  ?Date 03/06/21  ?Educator [facility]  ?Instruction Review Code 1- Verbalizes Understanding  ? ?  ? ? ?  Education: Resistance Exercise: ?- Group verbal and visual presentation on the components of exercise prescription. Introduces F.I.T.T principle from ACSM for exercise  prescriptions  Reviews F.I.T.T. principles of resistance exercise including progression. Written material given at graduation. ?Flowsheet Row Cardiac Rehab from 03/27/2021 in [facility] Cardiac and Pulmonary Rehab  ?Date 03/13/21  ?Educator AS  ?Instruction Review Code 1- Verbalizes Understanding  ? ?  ? ?  ?Education: Exercise & Equipment Safety: ?- Individual verbal instruction and demonstration of equipment use and safety with use of the equipment. ?Flowsheet Row Cardiac Rehab from 03/27/2021 in [facility] Cardiac and Pulmonary Rehab  ?Education need identified 02/26/21  ?Date 02/26/21  ?Educator [place]  ?Instruction Review Code 1- Verbalizes Understanding  ? ?  ? ? ?Education: Exercise Physiology & General Exercise Guidelines: ?- Group verbal and written instruction with models to review the exercise physiology of the cardiovascular system and associated critical values. Provides general exercise guidelines with specific guidelines to those with heart or lung disease.  ? ? ?Education: Flexibility, Balance, Mind/Body Relaxation: ?- Group verbal and visual presentation with interactive activity on the components of exercise prescription. Introduces F.I.T.T principle from ACSM for exercise prescriptions. Reviews F.I.T.T. principles of flexibility and balance exercise training including progression. Also discusses the mind body connection.  Reviews various relaxation techniques to help reduce and manage stress (i.e. Deep breathing, progressive muscle relaxation, and visualization). Balance handout provided to take home. Written material given at graduation. ?Flowsheet Row Cardiac Rehab from 03/27/2021 in [facility] Cardiac and Pulmonary Rehab  ?Date 03/27/21  ?Educator AS  ?Instruction Review Code 1- Verbalizes Understanding  ? ?  ? ? ?Activity Barriers & Risk Stratification: ? Activity Barriers & Cardiac Risk Stratification - 02/26/21 0947   ? ?  ? Activity Barriers & Cardiac Risk Stratification  ? Activity Barriers Decreased Ventricular  Function   ? Cardiac Risk Stratification High   ? ?  ?  ? ?  ? ? ?6 Minute Walk: ? 6 Minute Walk   ? ? Josephine Name 02/26/21 1149  ?  ?  ?  ? 6 Minute Walk  ? Phase Initial    ? Distance 1335 feet    ? Walk Time 6 mi

## 2021-03-28 ENCOUNTER — Ambulatory Visit (HOSPITAL_COMMUNITY)
Admission: RE | Admit: 2021-03-28 | Discharge: 2021-03-28 | Disposition: A | Discharge status: Home / Self Care | Payer: Managed Care, Other (non HMO) | Source: Ambulatory Visit | Attending: Adult Health | Admitting: Adult Health

## 2021-03-28 VITALS — BP 120/68 | HR 75 | Wt 200.0 lb

## 2021-03-28 DIAGNOSIS — Z8249 Family history of ischemic heart disease and other diseases of the circulatory system: Secondary | ICD-10-CM | POA: Diagnosis not present

## 2021-03-28 DIAGNOSIS — I2510 Atherosclerotic heart disease of native coronary artery without angina pectoris: Secondary | ICD-10-CM | POA: Insufficient documentation

## 2021-03-28 DIAGNOSIS — Z7901 Long term (current) use of anticoagulants: Secondary | ICD-10-CM | POA: Diagnosis not present

## 2021-03-28 DIAGNOSIS — I5022 Chronic systolic (congestive) heart failure: Secondary | ICD-10-CM | POA: Insufficient documentation

## 2021-03-28 DIAGNOSIS — Z7982 Long term (current) use of aspirin: Secondary | ICD-10-CM | POA: Insufficient documentation

## 2021-03-28 DIAGNOSIS — R42 Dizziness and giddiness: Secondary | ICD-10-CM | POA: Insufficient documentation

## 2021-03-28 DIAGNOSIS — Z79899 Other long term (current) drug therapy: Secondary | ICD-10-CM | POA: Diagnosis not present

## 2021-03-28 DIAGNOSIS — I255 Ischemic cardiomyopathy: Secondary | ICD-10-CM | POA: Insufficient documentation

## 2021-03-28 DIAGNOSIS — Z951 Presence of aortocoronary bypass graft: Secondary | ICD-10-CM | POA: Insufficient documentation

## 2021-03-28 DIAGNOSIS — Z955 Presence of coronary angioplasty implant and graft: Secondary | ICD-10-CM

## 2021-03-28 DIAGNOSIS — E785 Hyperlipidemia, unspecified: Secondary | ICD-10-CM | POA: Insufficient documentation

## 2021-03-28 LAB — BASIC METABOLIC PANEL
Anion gap: 5 (ref 5–15)
BUN: 14 mg/dL (ref 6–20)
CO2: 27 mmol/L (ref 22–32)
Calcium: 9.4 mg/dL (ref 8.9–10.3)
Chloride: 106 mmol/L (ref 98–111)
Creatinine, Ser: 1.38 mg/dL — ABNORMAL HIGH (ref 0.61–1.24)
GFR, Estimated: 60 mL/min — ABNORMAL LOW (ref >60)
Glucose, Bld: 106 mg/dL — ABNORMAL HIGH (ref 70–99)
Potassium: 5.4 mmol/L — ABNORMAL HIGH (ref 3.5–5.1)
Sodium: 138 mmol/L (ref 135–145)

## 2021-03-28 MED ORDER — FUROSEMIDE 40 MG PO TABS: 20.0000 mg | ORAL_TABLET | Freq: Every day | ORAL | 3 refills | Status: DC | PRN

## 2021-03-28 MED ORDER — EPLERENONE 25 MG PO TABS: 12.5000 mg | ORAL_TABLET | Freq: Every day | ORAL | 11 refills | Status: DC

## 2021-03-28 NOTE — Progress Notes (Signed)
?  ?Advanced Heart Failure Clinic Note  ? ?PCP: Tracie Harrier, MD ?Cardiology: Dr. Rockey Situ ?HF Cardiology: Dr. Aundra Dubin ?  ?56 y.o. with history of CAD s/p CABG, ischemic cardiomyopathy, and prior Hodgkins lymphoma was referred by Dr. Rockey Situ for CHF clinic evaluation.  Patient had Hodgkins disease diagnosed in 1994 and was treated with chemotherapy and radiation.  He has a strong family history of CAD and plus the chest wall radiation for Hodgkins treatment likely predisposed him to premature CAD.   ? ?He had CABG in 2013. Echo at the time of CABG showed EF 30%.  Low EF except for cMRI done in 02/2017 which showed EF 43%. Reported worsening dyspnea as well as chest pain in 01/2021 and 02/2021. Most recent echo in 02/2021 showed EF 20-25%, normal RV. Cath was done, showing 90% proximal-mid RCA stenosis with atretic RIMA-PDA.  This was treated with DES x 2.  RHC showed prominent RV failure.   ?  ?He was last seen by MD on 03/04/2021. He reported since PCI, he had been doing somewhat better symptomatically.  Denied chest pain. Denied SOB walking on flat ground. Endorsed SOB with stairs and with lifting/carrying heavy objects. Denied orthopnea/PND.  Denied lightheadedness.   ? ?Today he returns to HF clinic for pharmacist medication titration. At last visit with MD, spironolactone was discontinued and eplerenone 25 mg daily was initiated due to breast pain with spironolactone. Follow-up BMET on 03/13/2021  showed K of 5.4. Today, he reports feeling good overall but endorses dizziness and lightheadedness that occurs two times a week after taking his morning dose of meds. He states his home BP after waking up is 122/66, and after taking his meds his BP will drop to 90/60s. He states after drinking water and sitting down his BP will return to 124/66 after an hour and stabilize the rest of the day. This has not changed since changing to eplerenone. Denies chest pain and palpitations. He reports his breathing is overall good with  noticeable improvement since PCI. Denies SOB unless heavy exertion during cardiac rehab.  Weight at home is stable at 212-214 lbs. Takes furosemide 20 mg every other day, but reports he will skip some days if he feels like he doesn't need it. Denies LEE, PND, orthopnea. Appetite ok. Adheres to low sodium and potassium diet - no longer eats bananas. ? ? ?HF Medications: ?Carvedilol 3.125 mg BID ?Entresto 24-26 mg BID ?Eplerenone 25 mg QHS ?Farxiga 10 mg daily ?Furosemide 20 mg every other day ? ?Has the patient been experiencing any side effects to the medications prescribed?  Occasional dizziness after taking BP meds in the morning that resolves after an hour. ? ?Does the patient have any problems obtaining medications due to transportation or finances?   [place] ? ?Understanding of regimen: good ?Understanding of indications: excellent ?Potential of compliance: excellent ?Patient understands to avoid NSAIDs. ?Patient understands to avoid decongestants. ?  ? ?Pertinent Lab Values: ?03/28/2021 - Serum creatinine 1.38, BUN 14, Potassium 5.4, Sodium 138,  ?03/04/2021 - BNP 56 ? ?Vital Signs: ?Weight: 220.0 lbs (last clinic weight: 221.4 lbs.) ?Blood pressure: 120/68  ?Heart rate: 75  ? ?Assessment/Plan: ?Assessment/Plan: ?1. CAD: s/p CABG in 2013 and DES x 2 to native RCA in 02/2021 with atretic RIMA-PDA noted. No further chest pain since PCI.  ?- Continue aspirin 81 mg and Plavix 75 mg daily.  ?- Continue Crestor, Zetia. ?- Participates in cardiac rehab. ?2. Hyperlipidemia: Lipids in 02/2021 showed LDL 119 and TGs 291.  LDL goal of < 55 mg/dL. ?- Continue Crestor, Zetia, Vascepa, and Repatha. ?3. Chronic systolic CHF: Ischemic cardiomyopathy. He has had persistently low EF, most recent echo in 02/2021 with EF 20-25%, normal RV.  RHC in 02/2021 showed prominent RV failure with PAPI < 1.   ?- He is not volume overloaded on exam, NYHA class II symptoms. Orthostasis limits medication titration.   ?- Decrease  furosemide to 20 mg PRN only.  ?- Continue carvedilol 3.125 mg BID. ?- Continue Entresto 24-26 mg BID. ?- Potassium level on BMET today is still elevated with K of 5.4. Decrease eplerenone to 12.5 mg daily.  Recheck BMET at next pharmacy clinic visit.  ?- Continue Farxiga 10 mg daily.  ?- Repeat echo on 05/2021.  He has a wide LBBB (162 msec on 03/04/2021).  If EF remains low despite recent PCI, he will qualify for CRT-D device.  ?  ?Follow-up with pharmacy clinic in 3 weeks.  ? ? ?Audry Riles, PharmD, BCPS, BCCP, CPP ?Heart Failure Clinic Pharmacist ?[phone] ? ?

## 2021-03-28 NOTE — Patient Instructions (Addendum)
It was a pleasure seeing you today! ? ?MEDICATIONS: ?-We are changing your medications today ?-Change Lasix to taking as needed for fluid retention only. Take Lasix 20 mg (1/2 tablet) if you gain 3 lbs overnight or 5 lbs in 1 week) or have swelling in your legs.  ?-Decrease eplerenone to 12.5 mg (1/2 tablet) daily ?-Call if you have questions about your medications. ? ? ?NEXT APPOINTMENT: ?Return to clinic in 3 weeks with Pharmacy Clinic. ? ?In general, to take care of your heart failure: ?-Limit your fluid intake to 2 Liters (half-gallon) per day.   ?-Limit your salt intake to ideally 2-3 grams (2000-3000 mg) per day. ?-Weigh yourself daily and record, and bring that "weight diary" to your next appointment.  (Weight gain of 2-3 pounds in 1 day typically means fluid weight.) ?-The medications for your heart are to help your heart and help you live longer.   ?-Please contact us before stopping any of your heart medications. ? ?Call the clinic at [phone] with questions or to reschedule future appointments.  ?

## 2021-03-28 NOTE — Progress Notes (Signed)
Completed initial RD consultation ?

## 2021-03-29 ENCOUNTER — Encounter (HOSPITAL_COMMUNITY): Payer: Self-pay | Admitting: Cardiology

## 2021-04-01 ENCOUNTER — Other Ambulatory Visit: Payer: Self-pay | Admitting: Cardiovascular Disease

## 2021-04-01 ENCOUNTER — Other Ambulatory Visit: Payer: Self-pay

## 2021-04-01 ENCOUNTER — Encounter: Payer: Managed Care, Other (non HMO) | Admitting: *Deleted

## 2021-04-01 DIAGNOSIS — Z955 Presence of coronary angioplasty implant and graft: Secondary | ICD-10-CM

## 2021-04-01 NOTE — Progress Notes (Signed)
Daily Session Note ? ?Patient Details  ?Name: THAER MIYOSHI ?MRN: [id number] ?Date of Birth: 12-08-1965 ?Referring Provider:   ?Flowsheet Row Cardiac Rehab from 02/26/2021 in [facility] Cardiac and Pulmonary Rehab  ?Referring Provider End, Harrell Gave MD  ? ?  ? ? ?Encounter Date: 04/01/2021 ? ?Check In: ? Session Check In - 04/01/21 0811   ? ?  ? Check-In  ? Supervising physician immediately available to respond to emergencies See telemetry face sheet for immediately available ER MD   ? Location [facility]-Cardiac & Pulmonary Rehab   ? Staff Present Heath Lark, RN, BSN, Laveda Norman, BS, ACSM CEP, Exercise Physiologist;Joseph [place], [place]   ? Virtual Visit No   ? Medication changes reported     No   ? Fall or balance concerns reported    No   ? Warm-up and Cool-down Performed on first and last piece of equipment   ? Resistance Training Performed Yes   ? VAD Patient? No   ? PAD/SET Patient? No   ?  ? Pain Assessment  ? Currently in Pain? No/denies   ? ?  ?  ? ?  ? ? ? ? ? ?Social History  ? ?Tobacco Use  ?Smoking Status Former  ? Packs/day: 0.50  ? Types: Cigarettes  ? Quit date: 10/21/1996  ? Years since quitting: 24.4  ?Smokeless Tobacco Former  ? Types: Snuff  ? Quit date: 04/05/2019  ? ? ?Goals Met:  ?Independence with exercise equipment ?Exercise tolerated well ?No report of concerns or symptoms today ? ?Goals Unmet:  ?Not Applicable ? ?Comments: Pt able to follow exercise prescription today without complaint.  Will continue to monitor for progression. ? ? ? ?Dr. Emily Filbert is Medical Director for [place].  ?Dr. Ottie Glazier is Medical Director for [facility] Pulmonary Rehabilitation. ?

## 2021-04-03 ENCOUNTER — Other Ambulatory Visit: Payer: Self-pay

## 2021-04-03 DIAGNOSIS — Z955 Presence of coronary angioplasty implant and graft: Secondary | ICD-10-CM | POA: Diagnosis not present

## 2021-04-03 NOTE — Progress Notes (Signed)
Daily Session Note ? ?Patient Details  ?Name: Willie [name] ?MRN: [id number] ?Date of Birth: 1965/09/15 ?Referring Provider:   ?Flowsheet Row Cardiac Rehab from 02/26/2021 in [facility] Cardiac and Pulmonary Rehab  ?Referring Provider End, Harrell Gave MD  ? ?  ? ? ?Encounter Date: 04/03/2021 ? ?Check In: ? Session Check In - 04/03/21 9480   ? ?  ? Check-In  ? Supervising physician immediately available to respond to emergencies See telemetry face sheet for immediately available ER MD   ? Location [facility]-Cardiac & Pulmonary Rehab   ? Staff Present Birdie Sons, MPA, RN;Joseph [place], RCP,RRT,BSRT;Jessica [place], MA, RCEP, CCRP, [place], [place], ACSM CEP, Exercise Physiologist   ? Virtual Visit No   ? Medication changes reported     No   ? Fall or balance concerns reported    No   ? Warm-up and Cool-down Performed on first and last piece of equipment   ? Resistance Training Performed Yes   ? VAD Patient? No   ? PAD/SET Patient? No   ?  ? Pain Assessment  ? Currently in Pain? No/denies   ? ?  ?  ? ?  ? ? ? ? ? ?Social History  ? ?Tobacco Use  ?Smoking Status Former  ? Packs/day: 0.50  ? Types: Cigarettes  ? Quit date: 10/21/1996  ? Years since quitting: 24.4  ?Smokeless Tobacco Former  ? Types: Snuff  ? Quit date: 04/05/2019  ? ? ?Goals Met:  ?Independence with exercise equipment ?Exercise tolerated well ?No report of concerns or symptoms today ?Strength training completed today ? ?Goals Unmet:  ?Not Applicable ? ?Comments: Pt able to follow exercise prescription today without complaint.  Will continue to monitor for progression. ? ? ? ?Dr. Emily Filbert is Medical Director for [place].  ?Dr. Ottie Glazier is Medical Director for [facility] Pulmonary Rehabilitation. ?

## 2021-04-04 DIAGNOSIS — Z955 Presence of coronary angioplasty implant and graft: Secondary | ICD-10-CM | POA: Diagnosis not present

## 2021-04-04 NOTE — Progress Notes (Signed)
Daily Session Note ? ?Patient Details  ?Name: Willie [name] ?MRN: [id number] ?Date of Birth: 12/05/1965 ?Referring Provider:   ?Flowsheet Row Cardiac Rehab from 02/26/2021 in [facility] Cardiac and Pulmonary Rehab  ?Referring Provider End, Harrell Gave MD  ? ?  ? ? ?Encounter Date: 04/04/2021 ? ?Check In: ? Session Check In - 04/04/21 0732   ? ?  ? Check-In  ? Supervising physician immediately available to respond to emergencies See telemetry face sheet for immediately available ER MD   ? Location [facility]-Cardiac & Pulmonary Rehab   ? Staff Present Birdie Sons, MPA, RN;Joseph [place], RCP,RRT,BSRT;Melissa Linn, RDN, LDN   ? Virtual Visit No   ? Medication changes reported     No   ? Fall or balance concerns reported    No   ? Warm-up and Cool-down Performed on first and last piece of equipment   ? Resistance Training Performed Yes   ? VAD Patient? No   ? PAD/SET Patient? No   ?  ? Pain Assessment  ? Currently in Pain? No/denies   ? ?  ?  ? ?  ? ? ? ? ? ?Social History  ? ?Tobacco Use  ?Smoking Status Former  ? Packs/day: 0.50  ? Types: Cigarettes  ? Quit date: 10/21/1996  ? Years since quitting: 24.4  ?Smokeless Tobacco Former  ? Types: Snuff  ? Quit date: 04/05/2019  ? ? ?Goals Met:  ?Independence with exercise equipment ?Exercise tolerated well ?No report of concerns or symptoms today ?Strength training completed today ? ?Goals Unmet:  ?Not Applicable ? ?Comments: Pt able to follow exercise prescription today without complaint. Faxed ECG to Dr. Saunders Revel noting changes from his initial ECG for the doctor's review. Patient was asymptomatic. Will continue to monitor for progression. ? ? ? ?Dr. Emily Filbert is Medical Director for [place].  ?Dr. Ottie Glazier is Medical Director for [facility] Pulmonary Rehabilitation. ?

## 2021-04-08 ENCOUNTER — Encounter: Payer: Managed Care, Other (non HMO) | Attending: Internal Medicine | Admitting: *Deleted

## 2021-04-08 DIAGNOSIS — Z48812 Encounter for surgical aftercare following surgery on the circulatory system: Secondary | ICD-10-CM | POA: Insufficient documentation

## 2021-04-08 DIAGNOSIS — Z955 Presence of coronary angioplasty implant and graft: Secondary | ICD-10-CM | POA: Insufficient documentation

## 2021-04-08 NOTE — Progress Notes (Signed)
Daily Session Note ? ?Patient Details  ?Name: DAEMON DOWTY ?MRN: [id number] ?Date of Birth: 1965/03/30 ?Referring Provider:   ?Flowsheet Row Cardiac Rehab from 02/26/2021 in [facility] Cardiac and Pulmonary Rehab  ?Referring Provider End, Harrell Gave MD  ? ?  ? ? ?Encounter Date: 04/08/2021 ? ?Check In: ? Session Check In - 04/08/21 0809   ? ?  ? Check-In  ? Supervising physician immediately available to respond to emergencies See telemetry face sheet for immediately available ER MD   ? Location [facility]-Cardiac & Pulmonary Rehab   ? Staff Present Heath Lark, RN, BSN, Laveda Norman, BS, ACSM CEP, Exercise Physiologist;Joseph [place], [place]   ? Virtual Visit No   ? Medication changes reported     No   ? Fall or balance concerns reported    No   ? Warm-up and Cool-down Performed on first and last piece of equipment   ? Resistance Training Performed Yes   ? VAD Patient? No   ? PAD/SET Patient? No   ?  ? Pain Assessment  ? Currently in Pain? No/denies   ? ?  ?  ? ?  ? ? ? ? ? ?Social History  ? ?Tobacco Use  ?Smoking Status Former  ? Packs/day: 0.50  ? Types: Cigarettes  ? Quit date: 10/21/1996  ? Years since quitting: 24.4  ?Smokeless Tobacco Former  ? Types: Snuff  ? Quit date: 04/05/2019  ? ? ?Goals Met:  ?Independence with exercise equipment ?Exercise tolerated well ?No report of concerns or symptoms today ? ?Goals Unmet:  ?Not Applicable ? ?Comments: Pt able to follow exercise prescription today without complaint.  Will continue to monitor for progression. ? ? ? ?Dr. Emily Filbert is Medical Director for [place].  ?Dr. Ottie Glazier is Medical Director for [facility] Pulmonary Rehabilitation. ?

## 2021-04-10 DIAGNOSIS — Z955 Presence of coronary angioplasty implant and graft: Secondary | ICD-10-CM

## 2021-04-10 DIAGNOSIS — Z48812 Encounter for surgical aftercare following surgery on the circulatory system: Secondary | ICD-10-CM | POA: Diagnosis not present

## 2021-04-10 NOTE — Progress Notes (Signed)
Daily Session Note ? ?Patient Details  ?Name: Willie [name] ?MRN: [id number] ?Date of Birth: [birth year] ?Referring Provider:   ?Flowsheet Row Cardiac Rehab from 02/26/2021 in [facility] Cardiac and Pulmonary Rehab  ?Referring Provider End, Harrell Gave MD  ? ?  ? ? ?Encounter Date: 04/10/2021 ? ?Check In: ? Session Check In - 04/10/21 0727   ? ?  ? Check-In  ? Supervising physician immediately available to respond to emergencies See telemetry face sheet for immediately available ER MD   ? Location [facility]-Cardiac & Pulmonary Rehab   ? Staff Present Birdie Sons, MPA, RN;Joseph [place], RCP,RRT,BSRT;Jessica [place], MA, RCEP, CCRP, CCET   ? Virtual Visit No   ? Medication changes reported     No   ? Fall or balance concerns reported    No   ? Warm-up and Cool-down Performed on first and last piece of equipment   ? Resistance Training Performed Yes   ? VAD Patient? No   ? PAD/SET Patient? No   ?  ? Pain Assessment  ? Currently in Pain? No/denies   ? ?  ?  ? ?  ? ? ? ? ? ?Social History  ? ?Tobacco Use  ?Smoking Status Former  ? Packs/day: 0.50  ? Types: Cigarettes  ? Quit date: 10/21/1996  ? Years since quitting: 24.4  ?Smokeless Tobacco Former  ? Types: Snuff  ? Quit date: 04/05/2019  ? ? ?Goals Met:  ?Independence with exercise equipment ?Exercise tolerated well ?No report of concerns or symptoms today ?Strength training completed today ? ?Goals Unmet:  ?Not Applicable ? ?Comments: Pt able to follow exercise prescription today without complaint.  Will continue to monitor for progression. ? ? ? ?Dr. Emily Filbert is Medical Director for [place].  ?Dr. Ottie Glazier is Medical Director for [facility] Pulmonary Rehabilitation. ?

## 2021-04-11 DIAGNOSIS — Z955 Presence of coronary angioplasty implant and graft: Secondary | ICD-10-CM

## 2021-04-11 DIAGNOSIS — Z48812 Encounter for surgical aftercare following surgery on the circulatory system: Secondary | ICD-10-CM | POA: Diagnosis not present

## 2021-04-11 NOTE — Progress Notes (Signed)
Daily Session Note ? ?Patient Details  ?Name: Willie [name] ?MRN: [id number] ?Date of Birth: [birth year] ?Referring Provider:   ?Flowsheet Row Cardiac Rehab from 02/26/2021 in [facility] Cardiac and Pulmonary Rehab  ?Referring Provider End, Harrell Gave MD  ? ?  ? ? ?Encounter Date: 04/11/2021 ? ?Check In: ? Session Check In - 04/11/21 0713   ? ?  ? Check-In  ? Supervising physician immediately available to respond to emergencies See telemetry face sheet for immediately available ER MD   ? Location [facility]-Cardiac & Pulmonary Rehab   ? Staff Present Birdie Sons, MPA, RN;Jessica Villa Quintero, MA, RCEP, CCRP, CCET;Amanda Sommer, BA, ACSM CEP, Exercise Physiologist   ? Virtual Visit No   ? Medication changes reported     No   ? Fall or balance concerns reported    No   ? Warm-up and Cool-down Performed on first and last piece of equipment   ? Resistance Training Performed Yes   ? VAD Patient? No   ? PAD/SET Patient? No   ?  ? Pain Assessment  ? Currently in Pain? No/denies   ? ?  ?  ? ?  ? ? ? ? ? ?Social History  ? ?Tobacco Use  ?Smoking Status Former  ? Packs/day: 0.50  ? Types: Cigarettes  ? Quit date: 10/21/1996  ? Years since quitting: 24.4  ?Smokeless Tobacco Former  ? Types: Snuff  ? Quit date: 04/05/2019  ? ? ?Goals Met:  ?Independence with exercise equipment ?Exercise tolerated well ?No report of concerns or symptoms today ?Strength training completed today ? ?Goals Unmet:  ?Not Applicable ? ?Comments: Pt able to follow exercise prescription today without complaint.  Will continue to monitor for progression. ? ? ? ?Dr. Emily Filbert is Medical Director for [facility].  ?Dr. Ottie Glazier is Medical Director for [facility] Pulmonary Rehabilitation. ?

## 2021-04-11 NOTE — Progress Notes (Signed)
Cardiology Office Note ? ?Date:  04/12/2021  ? ?ID:  Willie VANOVERBEKE, DOB 05/17/1965, MRN [id number] ? ?PCP:  Tracie Harrier, MD  ? ?Chief Complaint  ?Patient presents with  ? 6 week follow up   ?  "Doing well." Medications reviewed by the patient verbally.   ? ? ?HPI:  ?56 year old gentleman with history of  ?Hodgkin's lymphoma with treatment in 1994, 1995 including chemotherapy and radiation , likely exacerbating coronary disease  ?smoking history for 20 years,  ?coronary artery disease, bypass surgery ?3 with  LIMA,left radial, RIMA  October 2013, cardiomyopathy with ejection fraction 30% noted prior to bypass surgery  ?33% by stress test August 2017, no ischemia ? hyperlipidemia  ?chronic systolic CHF ?Left bundle branch block noted prior to bypass surgery ?Mild bilateral carotid disease in 2013 ?Ejection fraction 25 to 30% on echo April 2021 ?who presents  for management of his ischemic cardiomyopathy. ? ?Last seen in clinic jan 2023 ?On last clinic visit felt better on reduced dose of Entresto, on higher dose had orthostasis ? ?COVID + January 04, 2020 ?In January 2003 reported chasing the dog, felt SOB, tachycardia ? ?Echocardiogram performed February 08, 2021 ejection fraction 20 to 25%, ejection fraction lower than prior study ? ?Cardiac catheterization performed February 15, 2021 ?---Successful PCI to proximal-mid RCA using overlapping Onyx frontier 3.5 x 15 mm (proximal) and 3.0 x 38 mm (distal) drug-eluting stents with 0% residual stenosis and TIMI-3 flow. ? ?In follow-up today, he reports feeling relatively well ?He has not been taking his Lasix ? no longer working, waiting for disability to come through ?Previously reported having shortness of breath when climbing up ladders and with oexertion ? ?Concerned about low blood pressure at times though denies near syncope ?Rare  orthostasis symptoms ? ?Participating with cardiac rehab ? ?Lab work reviewed ?Mildly elevated potassium, chronic issue ? ?EKG  personally reviewed by myself on todays visit ?Shows normal sinus rhythm left bundle branch block rate 65 bpm  ? ?Past medical history reviewed ?Echocardiogram 2021 ?Ejection fraction 20-25%  ? ?Cardiac MRI 2019 ?1. Normal LV size with septal-lateral dyssynchrony and mild diffuse hypokinesis, EF 43%. ?2.  Normal RV size and systolic function. ?3.  Moderate mitral regurgitation. ?4.  Minimal coronary pattern LGE. ? ?Seen by Dr. Caryl Comes and felt not be ICD candidate ? ?Stress test: 09/05/15 ?Exercise myocardial perfusion imaging study with no significant  ischemia ?Fixed defect mid to apical anterior wall, apical septal region and apical region ?Anteroseptal and septal wall hypokinesis, EF estimated at 33%  ?Abnormal wall motion possibly secondary to left bundle branch block ? ?Prior to the bypass surgery his legs felt heavy, had significant fatigue ?He had abnormal EKG leading to echocardiogram and stress test which led to catheterization and then bypass surgery ? ?Outside Echo 2016: EF 30 ?Severe MR?, moderate TR, RVSP 47+ RA ? ?Outside echo, Ejection fraction 25%  On 03/14/15 ?Moderate MR ? ?History of Hodgkin's lymphoma, hypertension, left bundle branch block ?Quit smoking 21 yrs back ( 1/2 ppd x 5-6 yrs) ? ?Catheterization report reviewed showing 85% left main disease, ?60% RCA disease, 50% circumflex disease ? ? 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 ? ?PSH:    ?Past  Surgical History:  ?Procedure Laterality Date  ? CARDIAC CATHETERIZATION    ? CARPAL TUNNEL RELEASE    ? CORONARY ARTERY BYPASS GRAFT  10/24/2011  ? Procedure: CORONARY  ARTERY BYPASS GRAFTING (CABG);  Surgeon: Rexene Alberts, MD;  Location: [place];  Service: Open Heart Surgery;  Laterality: N/A;  Coronary Artery Bypass Grafting X 3 Using Left Internal  Mammary Artery, Right Internal Mammary Artery and Right Radial Artery  ? CORONARY STENT INTERVENTION N/A 02/15/2021  ? Procedure: CORONARY STENT INTERVENTION;  Surgeon: Nelva Bush, MD;  Location: [place] [facility];  Service: Cardiovascular;  Laterality: N/A;  ? LYMPH NODE BIOPSY  1994  ? left neck  ? RADIAL ARTERY HARVEST  10/24/2011  ? Procedure: RADIAL ARTERY HARVEST;  Surgeon: Rexene Alberts, MD;  Location: [facility];  Service: Vascular;  Laterality: Left;  ? RIGHT/LEFT HEART CATH AND CORONARY/GRAFT ANGIOGRAPHY N/A 02/15/2021  ? Procedure: RIGHT/LEFT HEART CATH AND CORONARY/GRAFT ANGIOGRAPHY;  Surgeon: Nelva Bush, MD;  Location: [place] [facility];  Service: Cardiovascular;  Laterality: N/A;  ? ? ?Current Outpatient Medications  ?Medication Sig Dispense Refill  ? aspirin EC 81 MG tablet Take 81 mg by mouth every evening.    ? carvedilol (COREG) 3.125 MG tablet Take 1 tablet (3.125 mg total) by mouth 2 (two) times daily. 180 tablet 1  ? clopidogrel (PLAVIX) 75 MG tablet Take 1 tablet (75 mg total) by mouth daily with breakfast. 90 tablet 3  ? ENTRESTO 24-26 MG TAKE ONE TABLET BY MOUTH TWICE A DAY 180 tablet 0  ? eplerenone (INSPRA) 25 MG tablet Take 0.5 tablets (12.5 mg total) by mouth at bedtime. 15 tablet 11  ? Evolocumab (REPATHA SURECLICK) 144 MG/ML SOAJ Inject 1 pen. into the skin every 14 (fourteen) days. 6 mL 3  ? ezetimibe (ZETIA) 10 MG tablet TAKE ONE TABLET BY MOUTH ONE TIME DAILY 90 tablet 0  ? FARXIGA 10 MG TABS tablet TAKE ONE TABLET BY MOUTH EVERY MORNING BEFORE BREAKFAST 90 tablet 0  ? furosemide (LASIX) 40 MG tablet Take 0.5 tablets (20 mg total) by mouth daily as needed for fluid. 30 tablet 3  ? icosapent Ethyl (VASCEPA) 1 g capsule Take 2 capsules (2 g total) by mouth 2 (two) times daily. 120 capsule  11  ? pantoprazole (PROTONIX) 40 MG tablet Take 40 mg by mouth in the morning.    ? rosuvastatin (CRESTOR) 40 MG tablet Take 1 tablet (40 mg total) by mouth daily. 90 tablet 3  ? senna (SENOKOT) 8.6 MG tablet Take 4 tablets by mouth at bedtime.    ? vitamin B-12 (CYANOCOBALAMIN) 1000 MCG tablet Take 1 tablet (1,000 mcg total) by mouth daily. 30 tablet 1  ? allopurinol (ZYLOPRIM) 300 MG tablet Take 300 mg by mouth in the morning. (Patient not taking: Reported on 04/12/2021)    ? colchicine 0.6 MG tablet Take 0.6 mg by mouth daily as needed (GOUT FLARES). (Patient not taking: Reported on 04/12/2021)    ? ?No current facility-administered medications for this visit.  ? ? ?Allergies:   Spironolactone  ?Knots in breasts ? ?Social History:  The patient  reports that he quit smoking about 24 years ago. His smoking use included cigarettes. He smoked an average of .5 packs per day. He quit smokeless tobacco use about 2 years ago.  His smokeless tobacco use included snuff. He reports that he does not drink alcohol and does not use drugs.  ? ?Family History:   family history includes Coronary  artery disease in an other family member; Heart attack in his father; Heart disease in his mother.  ? ? ?Review of Systems: ?Review of Systems  ?Constitutional: Negative.   ?HENT: Negative.    ?Respiratory: Negative.    ?Cardiovascular: Negative.   ?Gastrointestinal: Negative.   ?Musculoskeletal: Negative.   ?Neurological: Negative.   ?Psychiatric/Behavioral: Negative.    ?All other systems reviewed and are negative. ? ?PHYSICAL EXAM: ?VS:  BP 100/70 (BP Location: Left Arm, Patient Position: Sitting, Cuff Size: Normal)   Pulse 65   Ht '5\' 8"'$  (1.727 m)   Wt 100.2 kg   SpO2 98%   BMI 33.60 kg/m?  , BMI Body mass index is 33.6 kg/m?Marland Kitchen  ?Constitutional:  oriented to person, place, and time. No distress.  ?HENT:  ?Head: Grossly normal ?Eyes:  no discharge. No scleral icterus.  ?Neck: No JVD, no carotid bruits  ?Cardiovascular: Regular rate and  rhythm, no murmurs appreciated ?Pulmonary/Chest: Clear to auscultation bilaterally, no wheezes or rails ?Abdominal: Soft.  no distension.  no tenderness.  ?Musculoskeletal: Normal range of motion ?Neurological:

## 2021-04-12 ENCOUNTER — Ambulatory Visit: Payer: Managed Care, Other (non HMO) | Admitting: Cardiovascular Disease

## 2021-04-12 ENCOUNTER — Encounter: Payer: Self-pay | Admitting: Cardiovascular Disease

## 2021-04-12 VITALS — BP 100/70 | HR 65 | Ht 68.0 in | Wt 221.0 lb

## 2021-04-12 DIAGNOSIS — I5022 Chronic systolic (congestive) heart failure: Secondary | ICD-10-CM

## 2021-04-12 DIAGNOSIS — E785 Hyperlipidemia, unspecified: Secondary | ICD-10-CM | POA: Diagnosis not present

## 2021-04-12 DIAGNOSIS — I10 Essential (primary) hypertension: Secondary | ICD-10-CM

## 2021-04-12 DIAGNOSIS — I2510 Atherosclerotic heart disease of native coronary artery without angina pectoris: Secondary | ICD-10-CM

## 2021-04-12 DIAGNOSIS — R0602 Shortness of breath: Secondary | ICD-10-CM

## 2021-04-12 DIAGNOSIS — Z951 Presence of aortocoronary bypass graft: Secondary | ICD-10-CM

## 2021-04-12 MED ORDER — FUROSEMIDE 40 MG PO TABS: 20.0000 mg | ORAL_TABLET | Freq: Every day | ORAL | 3 refills | Status: DC | PRN

## 2021-04-12 MED ORDER — ENTRESTO 24-26 MG PO TABS: 1.0000 | ORAL_TABLET | Freq: Two times a day (BID) | ORAL | 3 refills | Status: DC

## 2021-04-12 MED ORDER — CARVEDILOL 3.125 MG PO TABS: 3.1250 mg | ORAL_TABLET | Freq: Two times a day (BID) | ORAL | 3 refills | Status: DC

## 2021-04-12 MED ORDER — REPATHA SURECLICK 140 MG/ML ~~LOC~~ SOAJ: 1.0000 "pen " | SUBCUTANEOUS | 3 refills | Status: DC

## 2021-04-12 MED ORDER — EZETIMIBE 10 MG PO TABS: 10.0000 mg | ORAL_TABLET | Freq: Every day | ORAL | 3 refills | Status: DC

## 2021-04-12 MED ORDER — DAPAGLIFLOZIN PROPANEDIOL 10 MG PO TABS: 10.0000 mg | ORAL_TABLET | Freq: Every day | ORAL | 3 refills | Status: DC

## 2021-04-12 MED ORDER — CLOPIDOGREL BISULFATE 75 MG PO TABS: 75.0000 mg | ORAL_TABLET | Freq: Every day | ORAL | 3 refills | Status: DC

## 2021-04-12 MED ORDER — ROSUVASTATIN CALCIUM 40 MG PO TABS: 40.0000 mg | ORAL_TABLET | Freq: Every day | ORAL | 3 refills | Status: DC

## 2021-04-12 NOTE — Patient Instructions (Signed)
Medication Instructions:  No changes  If you need a refill on your cardiac medications before your next appointment, please call your pharmacy.    Lab work: No new labs needed   Testing/Procedures: No new testing needed   Follow-Up: At [facility], you and your health needs are our priority.  As part of our continuing mission to provide you with exceptional heart care, we have created designated Provider Care Teams.  These Care Teams include your primary Cardiologist (physician) and Advanced Practice Providers (APPs -  Physician Assistants and Nurse Practitioners) who all work together to provide you with the care you need, when you need it.  You will need a follow up appointment in 6 months  Providers on your designated Care Team:   Christopher Berge, NP Ryan Dunn, PA-C Cadence Furth, PA-C  COVID-19 Vaccine Information can be found at: [email or web address] For questions related to vaccine distribution or appointments, please email [email or web address] or call [phone].   

## 2021-04-15 ENCOUNTER — Encounter: Payer: Managed Care, Other (non HMO) | Admitting: *Deleted

## 2021-04-15 DIAGNOSIS — Z48812 Encounter for surgical aftercare following surgery on the circulatory system: Secondary | ICD-10-CM | POA: Diagnosis not present

## 2021-04-15 DIAGNOSIS — Z955 Presence of coronary angioplasty implant and graft: Secondary | ICD-10-CM

## 2021-04-15 NOTE — Progress Notes (Signed)
Daily Session Note ? ?Patient Details  ?Name: AYDIAN DIMMICK ?MRN: [id number] ?Date of Birth: 12/05/1965 ?Referring Provider:   ?Flowsheet Row Cardiac Rehab from 02/26/2021 in [facility] Cardiac and Pulmonary Rehab  ?Referring Provider End, Harrell Gave MD  ? ?  ? ? ?Encounter Date: 04/15/2021 ? ?Check In: ? Session Check In - 04/15/21 0811   ? ?  ? Check-In  ? Supervising physician immediately available to respond to emergencies See telemetry face sheet for immediately available ER MD   ? Location [facility]-Cardiac & Pulmonary Rehab   ? Staff Present Heath Lark, RN, BSN, CCRP;Joseph [place], RCP,RRT,BSRT;Laureen [place], BS, RRT, CPFT;Kelly [place], BS, ACSM CEP, Exercise Physiologist   ? Virtual Visit No   ? Medication changes reported     No   ? Fall or balance concerns reported    No   ? Warm-up and Cool-down Performed on first and last piece of equipment   ? Resistance Training Performed Yes   ? VAD Patient? No   ? PAD/SET Patient? No   ?  ? Pain Assessment  ? Currently in Pain? No/denies   ? ?  ?  ? ?  ? ? ? ? ? ?Social History  ? ?Tobacco Use  ?Smoking Status Former  ? Packs/day: 0.50  ? Types: Cigarettes  ? Quit date: 10/21/1996  ? Years since quitting: 24.4  ?Smokeless Tobacco Former  ? Types: Snuff  ? Quit date: 04/05/2019  ? ? ?Goals Met:  ?Independence with exercise equipment ?Exercise tolerated well ?No report of concerns or symptoms today ? ?Goals Unmet:  ?Not Applicable ? ?Comments: Pt able to follow exercise prescription today without complaint.  Will continue to monitor for progression. ? ? ? ?Dr. Emily Filbert is Medical Director for [facility].  ?Dr. Ottie Glazier is Medical Director for [facility] Pulmonary Rehabilitation. ?

## 2021-04-17 ENCOUNTER — Encounter: Payer: Managed Care, Other (non HMO) | Admitting: *Deleted

## 2021-04-17 DIAGNOSIS — Z955 Presence of coronary angioplasty implant and graft: Secondary | ICD-10-CM

## 2021-04-17 DIAGNOSIS — Z48812 Encounter for surgical aftercare following surgery on the circulatory system: Secondary | ICD-10-CM | POA: Diagnosis not present

## 2021-04-17 NOTE — Progress Notes (Incomplete)
***In Progress*** ? ?  ?Advanced Heart Failure Clinic Note  ? ?HPI:  ?56 y.o. with history of CAD s/p CABG, ischemic cardiomyopathy, and prior Hodgkins lymphoma was referred by Dr. Rockey Situ for CHF clinic evaluation.  Patient had Hodgkins disease diagnosed in 1994 and was treated with chemotherapy and radiation.  He has a strong family history of CAD and plus the chest wall radiation for Hodgkins treatment likely predisposed him to premature CAD.   ?  ?He had CABG in 2013. Echo at the time of CABG showed EF 30%.  Low EF except for cMRI done in 02/2017 which showed EF 43%. Reported worsening dyspnea as well as chest pain in 01/2021 and 02/2021. Most recent echo in 02/2021 showed EF 20-25%, normal RV. Cath was done, showing 90% proximal-mid RCA stenosis with atretic RIMA-PDA.  This was treated with DES x 2.  RHC showed prominent RV failure.   ?  ?He was last seen by MD on 03/04/2021. He reported since PCI, he had been doing somewhat better symptomatically.  Denied chest pain. Denied SOB walking on flat ground. Endorsed SOB with stairs and with lifting/carrying heavy objects. Denied orthopnea/PND.  Denied lightheadedness.   ?  ?Today he returns to HF clinic for pharmacist medication titration. At last visit with MD, spironolactone was discontinued and eplerenone 25 mg daily was initiated due to breast pain with spironolactone. Follow-up BMET on 03/13/2021  showed K of 5.4. Today, he reports feeling good overall but endorses dizziness and lightheadedness that occurs two times a week after taking his morning dose of meds. He states his home BP after waking up is 122/66, and after taking his meds his BP will drop to 90/60s. He states after drinking water and sitting down his BP will return to 124/66 after an hour and stabilize the rest of the day. This has not changed since changing to eplerenone. Denies chest pain and palpitations. He reports his breathing is overall good with noticeable improvement since PCI. Denies SOB unless  heavy exertion during cardiac rehab.  Weight at home is stable at 212-214 lbs. Takes furosemide 20 mg every other day, but reports he will skip some days if he feels like he doesn't need it. Denies LEE, PND, orthopnea. Appetite ok. Adheres to low sodium and potassium diet - no longer eats bananas. ? ?Today he returns to HF clinic for pharmacist medication titration. At last visit with MD ***.  ? ?Shortness of breath/dyspnea on exertion? {YES NO:22349}  ?Orthopnea/PND? {YES NO:22349} ?Edema? {YES NO:22349} ?Lightheadedness/dizziness? {YES NO:22349} ?Daily weights at home? {YES NO:22349} ?Blood pressure/heart rate monitoring at home? {YES NO:22349} ?Following low-sodium/fluid-restricted diet? {YES NO:22349} ? ?HF Medications: ?Carvedilol 3.125 mg BID ?Entresto 24/26 mg BID ?Eplerenone 12.5 mg daily ?Farxiga 10 mg daily ?Furosemide 20 mg prn daily ? ?Has the patient been experiencing any side effects to the medications prescribed?  {YES NO:22349} ? ?Does the patient have any problems obtaining medications due to transportation or finances?   {YES NO:22349} ? ?Understanding of regimen: {excellent/good/fair/poor:19665} ?Understanding of indications: {excellent/good/fair/poor:19665} ?Potential of compliance: {excellent/good/fair/poor:19665} ?Patient understands to avoid NSAIDs. ?Patient understands to avoid decongestants. ?  ? ?Pertinent Lab Values: ?03/28/21: Serum creatinine 1.38, BUN 14, Potassium 5.4, Sodium 138,  ?03/04/21: BNP 56.5 pg/mL ? ?Vital Signs: ?Weight: *** (last clinic weight: 220 lbs) ?Blood pressure: ***  ?Heart rate: ***  ? ?Assessment/Plan: ?1.  CAD: s/p CABG in 2013 and DES x 2 to native RCA in 02/2021 with atretic RIMA-PDA noted. No further chest pain since PCI.  ?-  Continue aspirin 81 mg and Plavix 75 mg daily.  ?- Continue Crestor, Zetia. ?- Participates in cardiac rehab. ? ?2. Hyperlipidemia: Lipids in 02/2021 showed LDL 119 and TGs 291. LDL goal of < 55 mg/dL. ?- Continue Crestor, Zetia, Vascepa, and  Repatha. ? ?3. Chronic systolic CHF: Ischemic cardiomyopathy. He has had persistently low EF, most recent echo in 02/2021 with EF 20-25%, normal RV.  RHC in 02/2021 showed prominent RV failure with PAPI < 1.   ?- He is not volume overloaded on exam, NYHA class II symptoms. Orthostasis limits medication titration.   ?- Decrease furosemide to 20 mg PRN only.  ?- Continue carvedilol 3.125 mg BID. ?- Continue Entresto 24-26 mg BID. ?- Potassium level on BMET today is still elevated with K of 5.4. Decrease eplerenone to 12.5 mg daily.  Recheck BMET at next pharmacy clinic visit.  ?- Continue Farxiga 10 mg daily.  ?- Repeat echo on 05/2021.  He has a wide LBBB (162 msec on 03/04/2021).  If EF remains low despite recent PCI, he will qualify for CRT-D device.  ? ?Follow up *** ? ? ?Audry Riles, PharmD, BCPS, BCCP, CPP ?Heart Failure Clinic Pharmacist ?[phone] ? ? ?

## 2021-04-17 NOTE — Progress Notes (Signed)
Daily Session Note ? ?Patient Details  ?Name: Willie [name] ?MRN: [id number] ?Date of Birth: [birth year] ?Referring Provider:   ?Flowsheet Row Cardiac Rehab from 02/26/2021 in [facility] Cardiac and Pulmonary Rehab  ?Referring Provider End, Harrell Gave MD  ? ?  ? ? ?Encounter Date: 04/17/2021 ? ?Check In: ? Session Check In - 04/17/21 0851   ? ?  ? Check-In  ? Supervising physician immediately available to respond to emergencies See telemetry face sheet for immediately available ER MD   ? Location [facility]-Cardiac & Pulmonary Rehab   ? Staff Present Hope Budds, RDN, LDN;Amanda Sommer, BA, ACSM CEP, Exercise Physiologist;Margee T[place], RN, BSN, CCRP;Joseph [place], RCP,RRT,BSRT   ? Virtual Visit No   ? Medication changes reported     No   ? Fall or balance concerns reported    No   ? Warm-up and Cool-down Performed on first and last piece of equipment   ? Resistance Training Performed Yes   ? VAD Patient? No   ? PAD/SET Patient? No   ?  ? Pain Assessment  ? Currently in Pain? No/denies   ? ?  ?  ? ?  ? ? ? ? ? ?Social History  ? ?Tobacco Use  ?Smoking Status Former  ? Packs/day: 0.50  ? Types: Cigarettes  ? Quit date: 10/21/1996  ? Years since quitting: 24.5  ?Smokeless Tobacco Former  ? Types: Snuff  ? Quit date: 04/05/2019  ? ? ?Goals Met:  ?Independence with exercise equipment ?Exercise tolerated well ?No report of concerns or symptoms today ? ?Goals Unmet:  ?Not Applicable ? ?Comments: Pt able to follow exercise prescription today without complaint.  Will continue to monitor for progression. ? ? ? ?Dr. Emily Filbert is Medical Director for [place] [facility].  ?Dr. Ottie Glazier is Medical Director for [facility] Pulmonary Rehabilitation. ?

## 2021-04-18 ENCOUNTER — Ambulatory Visit (HOSPITAL_COMMUNITY)
Admission: RE | Admit: 2021-04-18 | Discharge: 2021-04-18 | Disposition: A | Discharge status: Home / Self Care | Payer: Managed Care, Other (non HMO) | Source: Ambulatory Visit | Attending: Internal Medicine | Admitting: Internal Medicine

## 2021-04-18 ENCOUNTER — Encounter: Payer: Managed Care, Other (non HMO) | Admitting: *Deleted

## 2021-04-18 VITALS — BP 132/86 | HR 77 | Wt 221.2 lb

## 2021-04-18 DIAGNOSIS — Z8249 Family history of ischemic heart disease and other diseases of the circulatory system: Secondary | ICD-10-CM | POA: Diagnosis not present

## 2021-04-18 DIAGNOSIS — Z48812 Encounter for surgical aftercare following surgery on the circulatory system: Secondary | ICD-10-CM | POA: Diagnosis not present

## 2021-04-18 DIAGNOSIS — I255 Ischemic cardiomyopathy: Secondary | ICD-10-CM | POA: Diagnosis not present

## 2021-04-18 DIAGNOSIS — R0602 Shortness of breath: Secondary | ICD-10-CM | POA: Insufficient documentation

## 2021-04-18 DIAGNOSIS — Z955 Presence of coronary angioplasty implant and graft: Secondary | ICD-10-CM

## 2021-04-18 DIAGNOSIS — Z7982 Long term (current) use of aspirin: Secondary | ICD-10-CM | POA: Insufficient documentation

## 2021-04-18 DIAGNOSIS — I2510 Atherosclerotic heart disease of native coronary artery without angina pectoris: Secondary | ICD-10-CM | POA: Insufficient documentation

## 2021-04-18 DIAGNOSIS — Z951 Presence of aortocoronary bypass graft: Secondary | ICD-10-CM | POA: Insufficient documentation

## 2021-04-18 DIAGNOSIS — I5022 Chronic systolic (congestive) heart failure: Secondary | ICD-10-CM | POA: Diagnosis not present

## 2021-04-18 DIAGNOSIS — I447 Left bundle-branch block, unspecified: Secondary | ICD-10-CM | POA: Diagnosis not present

## 2021-04-18 DIAGNOSIS — Z7984 Long term (current) use of oral hypoglycemic drugs: Secondary | ICD-10-CM | POA: Insufficient documentation

## 2021-04-18 DIAGNOSIS — Z7902 Long term (current) use of antithrombotics/antiplatelets: Secondary | ICD-10-CM | POA: Diagnosis not present

## 2021-04-18 DIAGNOSIS — Z7901 Long term (current) use of anticoagulants: Secondary | ICD-10-CM | POA: Insufficient documentation

## 2021-04-18 DIAGNOSIS — E785 Hyperlipidemia, unspecified: Secondary | ICD-10-CM | POA: Insufficient documentation

## 2021-04-18 DIAGNOSIS — Z79899 Other long term (current) drug therapy: Secondary | ICD-10-CM | POA: Diagnosis not present

## 2021-04-18 LAB — BASIC METABOLIC PANEL
Anion gap: 5 (ref 5–15)
BUN: 15 mg/dL (ref 6–20)
CO2: 26 mmol/L (ref 22–32)
Calcium: 9.2 mg/dL (ref 8.9–10.3)
Chloride: 107 mmol/L (ref 98–111)
Creatinine, Ser: 1.29 mg/dL — ABNORMAL HIGH (ref 0.61–1.24)
GFR, Estimated: >60 mL/min (ref >60)
Glucose, Bld: 99 mg/dL (ref 70–99)
Potassium: 5 mmol/L (ref 3.5–5.1)
Sodium: 138 mmol/L (ref 135–145)

## 2021-04-18 NOTE — Patient Instructions (Signed)
It was a pleasure seeing you today! ? ?MEDICATIONS: ?-No medication changes today ?-Call if you have questions about your medications. ? ?LABS: ?-We will call you if your labs need attention. ? ?NEXT APPOINTMENT: ?Return to clinic in 1 month with Dr. Aundra Dubin. ? ?In general, to take care of your heart failure: ?-Limit your fluid intake to 2 Liters (half-gallon) per day.   ?-Limit your salt intake to ideally 2-3 grams (2000-3000 mg) per day. ?-Weigh yourself daily and record, and bring that "weight diary" to your next appointment.  (Weight gain of 2-3 pounds in 1 day typically means fluid weight.) ?-The medications for your heart are to help your heart and help you live longer.   ?-Please contact us before stopping any of your heart medications. ? ?Call the clinic at [phone] with questions or to reschedule future appointments.  ?

## 2021-04-18 NOTE — Telephone Encounter (Signed)
I just released them, he should be able to see them now. Thanks!

## 2021-04-18 NOTE — Progress Notes (Signed)
Student Intern completed SDOH screening with pt. No interventions needed at this time. ? ?Su Grand, MSW student intern ?Advanced Heart Failure Clinic ?[place] ?[phone] ? ? ?

## 2021-04-18 NOTE — Progress Notes (Signed)
?  ?Advanced Heart Failure Clinic Note  ? ?PCP: Tracie Harrier, MD ?Cardiology: Dr. Rockey Situ ?HF Cardiology: Dr. Aundra Dubin ? ?HPI:  ?56 y.o. with history of CAD s/p CABG, ischemic cardiomyopathy, and prior Hodgkins lymphoma was referred by Dr. Rockey Situ for CHF clinic evaluation.  Patient had Hodgkins disease diagnosed in 1994 and was treated with chemotherapy and radiation.  He has a strong family history of CAD and plus the chest wall radiation for Hodgkins treatment likely predisposed him to premature CAD.   ?  ?He had CABG in 2013. Echo at the time of CABG showed EF 30%.  Low EF except for cMRI done in 02/2017 which showed EF 43%. Reported worsening dyspnea as well as chest pain in 01/2021 and 02/2021. Most recent echo in 02/2021 showed EF 20-25%, normal RV. Cath was done, showing 90% proximal-mid RCA stenosis with atretic RIMA-PDA.  This was treated with DES x 2.  RHC showed prominent RV failure.   ?  ?He was last seen in the AHF Clinic on 03/04/2021. He reported since PCI, he had been doing somewhat better symptomatically.  Denied chest pain. Denied SOB walking on flat ground. Endorsed SOB with stairs and with lifting/carrying heavy objects. Denied orthopnea/PND.  Denied lightheadedness.   ?  ?Returned to HF clinic for pharmacist medication titration 03/28/21. At previous visit with MD, spironolactone was discontinued and eplerenone 25 mg daily was initiated due to breast pain with spironolactone. Follow-up BMET on 03/13/2021  showed K of 5.4. Reported feeling good overall but endorsed dizziness and lightheadedness that occurs two times a week after taking his morning dose of meds. He stated his home BP after waking up is 122/66, and after taking his meds his BP will drop to 90/60s. He stated after drinking water and sitting down his BP will return to 124/66 after an hour and stabilize the rest of the day. This had not changed since changing to eplerenone. Denied chest pain and palpitations. He reported his breathing was  overall good with noticeable improvement since PCI. Denied SOB unless heavy exertion during cardiac rehab.  Weight at home was stable at 212-214 lbs. Reported taking furosemide 20 mg every other day, but noted he would skip some days if he felt like he didn't need it. Denied LEE, PND, orthopnea. Appetite was ok. Noted that he adheres to low sodium and potassium diet - no longer eats bananas. ? ?Today he returns to HF clinic with his wife on the phone for pharmacist medication titration. His wife manages his medications and fills his pill box. At last visit with pharmacy clinic, furosemide was decreased to 20 mg daily PRN, and eplerenone was decreased to 12.5 mg daily due to an elevated potassium level of 5.4. Today he reports feeling good overall. Denies dizziness/lightheadedness. Endorses fatigue with exertion. Denies chest pain/palpitations. Says his home blood pressures are usually 90/60 after taking his morning medications, but improve throughout the day. However, his BP will occassionally drop to 80/50 (most recently happened earlier this week). BP then will improve over the course of the day to ~120s/80s. Notes his breathing is ok, but gets short of breath with yard work, and walking on the stair machine at cardiac rehab. Able to climb the stair machine for 20 minutes, gets SOB after 10 minutes but decreases pace.  Weight at home stable from 212-213 lbs. At last visit with cardiology, patient stated his furosemide was increased to 10 mg every other day to prevent fluid accumulation. However, patient reports not taking any  doses within the last week and fluid level has remained stable. Denies LEE/PND/Orthopnea. Says his appetite is "too good," endorses adhering to a  low salt diet. Counseled patient to avoid OTC decongestants, pseudoephedrine and phenylephrine (patient's wife recalled they were told to avoid these in the hospital but couldn't remember the reason). Clarified with patient that he should be  taking/filling high intensity statin rosuvastatin 40 mg daily, not the 5 mg daily prescription that was recently sent to his pharmacy by his PCP.   ? ? ?HF Medications: ?Carvedilol 3.125 mg BID ?Entresto 24/26 mg BID ?Eplerenone 12.5 mg daily ?Farxiga 10 mg daily ?Furosemide 20 mg prn daily ? ?Has the patient been experiencing any side effects to the medications prescribed? No ? ?Does the patient have any problems obtaining medications due to transportation or finances?  [employer], No issues affording medications.  ? ?Understanding of regimen: poor ?Understanding of indications: fair ?Potential of compliance: good ?Patient understands to avoid NSAIDs. ?Patient understands to avoid decongestants. ?  ? ?Pertinent Lab Values: ?03/28/21: Serum creatinine 1.38, BUN 14, Potassium 5.4, Sodium 138,  ?03/04/21: BNP 56.5 pg/mL ?BMET today pending ? ?Vital Signs: ?Weight: 221.2 lbs (last clinic weight: 220 lbs) ?Blood pressure: 132/86  ?Heart rate: 77  ? ?Assessment/Plan: ?1.  CAD: s/p CABG in 2013 and DES x 2 to native RCA in 02/2021 with atretic RIMA-PDA noted. No further chest pain since PCI.  ?- Continue aspirin 81 mg and Plavix 75 mg daily.  ?- Continue rosuvastatin 40 mg daily and Zetia. ?- Participates in cardiac rehab. ? ?2. Hyperlipidemia: Lipids in 02/2021 showed LDL 119 and TGs 291. LDL goal of < 55 mg/dL. ?- Continue rosuvastatin, Zetia, Vascepa, and Repatha. ? ?3. Chronic systolic CHF: Ischemic cardiomyopathy. He has had persistently low EF, most recent echo in 02/2021 with EF 20-25%, normal RV.  RHC in 02/2021 showed prominent RV failure with PAPI < 1.   ?- He is not volume overloaded on exam, NYHA class II symptoms. Orthostasis limits medication titration.   ?- Decrease furosemide to 20 mg PRN only. Furosemide is likely contributing to orthostasis.  ?- BMET today pending.  ?- Continue carvedilol 3.125 mg BID. ?- Continue Entresto 24-26 mg BID. ?- Continue eplerenone 12.5 mg daily ?- Continue Farxiga 10 mg  daily.  ?- Repeat echo 05/2021.  He has a wide LBBB (162 msec on 03/04/2021).  If EF remains low despite recent PCI, he will qualify for CRT-D device.  ? ?Follow up 05/29/21 with Dr. Aundra Dubin.  ? ? ?Audry Riles, PharmD, BCPS, BCCP, CPP ?Heart Failure Clinic Pharmacist ?[phone] ? ? ? ? ?

## 2021-04-18 NOTE — Progress Notes (Signed)
Daily Session Note ? ?Patient Details  ?Name: Willie [name] ?MRN: [id number] ?Date of Birth: 07/26/1965 ?Referring Provider:   ?Flowsheet Row Cardiac Rehab from 02/26/2021 in [facility] Cardiac and Pulmonary Rehab  ?Referring Provider End, Harrell Gave MD  ? ?  ? ? ?Encounter Date: 04/18/2021 ? ?Check In: ? Session Check In - 04/18/21 0804   ? ?  ? Check-In  ? Supervising physician immediately available to respond to emergencies See telemetry face sheet for immediately available ER MD   ? Location [facility]-Cardiac & Pulmonary Rehab   ? Staff Present Nyoka Cowden, RN, BSN, [place], BS, ACSM CEP, Exercise Physiologist;Melissa Linn, [place], LDN   ? Virtual Visit No   ? Medication changes reported     No   ? Fall or balance concerns reported    No   ? Tobacco Cessation No Change   ? Warm-up and Cool-down Performed on first and last piece of equipment   ? Resistance Training Performed Yes   ? VAD Patient? No   ? PAD/SET Patient? No   ?  ? Pain Assessment  ? Currently in Pain? No/denies   ? ?  ?  ? ?  ? ? ? ? ? ?Social History  ? ?Tobacco Use  ?Smoking Status Former  ? Packs/day: 0.50  ? Types: Cigarettes  ? Quit date: 10/21/1996  ? Years since quitting: 24.5  ?Smokeless Tobacco Former  ? Types: Snuff  ? Quit date: 04/05/2019  ? ? ?Goals Met:  ?Independence with exercise equipment ?Exercise tolerated well ?Strength training completed today ? ?Goals Unmet:  ?Not Applicable ? ?Comments: Pt able to follow exercise prescription today without complaint.  Will continue to monitor for progression.  ? ? ?Dr. Emily Filbert is Medical Director for [place].  ?Dr. Ottie Glazier is Medical Director for [facility] Pulmonary Rehabilitation. ?

## 2021-04-22 ENCOUNTER — Encounter: Payer: Managed Care, Other (non HMO) | Admitting: *Deleted

## 2021-04-22 DIAGNOSIS — Z48812 Encounter for surgical aftercare following surgery on the circulatory system: Secondary | ICD-10-CM | POA: Diagnosis not present

## 2021-04-22 DIAGNOSIS — Z955 Presence of coronary angioplasty implant and graft: Secondary | ICD-10-CM

## 2021-04-22 NOTE — Progress Notes (Signed)
Daily Session Note ? ?Patient Details  ?Name: ADI DORO ?MRN: [id number] ?Date of Birth: 1965-06-24 ?Referring Provider:   ?Flowsheet Row Cardiac Rehab from 02/26/2021 in [facility] Cardiac and Pulmonary Rehab  ?Referring Provider End, Harrell Gave MD  ? ?  ? ? ?Encounter Date: 04/22/2021 ? ?Check In: ? Session Check In - 04/22/21 0811   ? ?  ? Check-In  ? Supervising physician immediately available to respond to emergencies See telemetry face sheet for immediately available ER MD   ? Location [facility]-Cardiac & Pulmonary Rehab   ? Staff Present Earlean Shawl, BS, ACSM CEP, Exercise Physiologist;Joseph [place], [place];Heath Lark, RN, BSN, CCRP   ? Virtual Visit No   ? Medication changes reported     No   ? Fall or balance concerns reported    No   ? Warm-up and Cool-down Performed on first and last piece of equipment   ? Resistance Training Performed Yes   ? VAD Patient? No   ? PAD/SET Patient? No   ?  ? Pain Assessment  ? Currently in Pain? No/denies   ? ?  ?  ? ?  ? ? ? ? ? ?Social History  ? ?Tobacco Use  ?Smoking Status Former  ? Packs/day: 0.50  ? Types: Cigarettes  ? Quit date: 10/21/1996  ? Years since quitting: 24.5  ?Smokeless Tobacco Former  ? Types: Snuff  ? Quit date: 04/05/2019  ? ? ?Goals Met:  ?Independence with exercise equipment ?Exercise tolerated well ?No report of concerns or symptoms today ? ?Goals Unmet:  ?Not Applicable ? ?Comments: Pt able to follow exercise prescription today without complaint.  Will continue to monitor for progression. ? ? ? ?Dr. Emily Filbert is Medical Director for [place].  ?Dr. Ottie Glazier is Medical Director for [facility] Pulmonary Rehabilitation. ?

## 2021-04-24 ENCOUNTER — Encounter: Payer: Self-pay | Admitting: *Deleted

## 2021-04-24 DIAGNOSIS — Z48812 Encounter for surgical aftercare following surgery on the circulatory system: Secondary | ICD-10-CM | POA: Diagnosis not present

## 2021-04-24 DIAGNOSIS — Z955 Presence of coronary angioplasty implant and graft: Secondary | ICD-10-CM

## 2021-04-24 NOTE — Progress Notes (Signed)
Cardiac Individual Treatment Plan ? ?Patient Details  ?Name: Willie [name] ?MRN: [id number] ?Date of Birth: [birth year] ?Referring Provider:   ?Flowsheet Row Cardiac Rehab from 02/26/2021 in [facility] Cardiac and Pulmonary Rehab  ?Referring Provider End, Harrell Gave MD  ? ?  ? ? ?Initial Encounter Date:  ?Flowsheet Row Cardiac Rehab from 02/26/2021 in [facility] Cardiac and Pulmonary Rehab  ?Date 02/26/21  ? ?  ? ? ?Visit Diagnosis: Status post coronary artery stent placement ? ?Patient's Home Medications on Admission: ? ?Current Outpatient Medications:  ?  allopurinol (ZYLOPRIM) 300 MG tablet, Take 300 mg by mouth in the morning. (Patient not taking: Reported on 04/12/2021), Disp: , Rfl:  ?  aspirin EC 81 MG tablet, Take 81 mg by mouth every evening., Disp: , Rfl:  ?  carvedilol (COREG) 3.125 MG tablet, Take 1 tablet (3.125 mg total) by mouth 2 (two) times daily., Disp: 180 tablet, Rfl: 3 ?  clopidogrel (PLAVIX) 75 MG tablet, Take 1 tablet (75 mg total) by mouth daily with breakfast., Disp: 90 tablet, Rfl: 3 ?  colchicine 0.6 MG tablet, Take 0.6 mg by mouth daily as needed (GOUT FLARES). (Patient not taking: Reported on 04/12/2021), Disp: , Rfl:  ?  dapagliflozin propanediol (FARXIGA) 10 MG TABS tablet, Take 1 tablet (10 mg total) by mouth daily with breakfast., Disp: 90 tablet, Rfl: 3 ?  eplerenone (INSPRA) 25 MG tablet, Take 0.5 tablets (12.5 mg total) by mouth at bedtime., Disp: 15 tablet, Rfl: 11 ?  Evolocumab (REPATHA SURECLICK) 829 MG/ML SOAJ, Inject 1 pen. into the skin every 14 (fourteen) days., Disp: 6 mL, Rfl: 3 ?  ezetimibe (ZETIA) 10 MG tablet, Take 1 tablet (10 mg total) by mouth daily., Disp: 90 tablet, Rfl: 3 ?  furosemide (LASIX) 40 MG tablet, Take 0.5 tablets (20 mg total) by mouth daily as needed for fluid., Disp: 30 tablet, Rfl: 3 ?  icosapent Ethyl (VASCEPA) 1 g capsule, Take 2 capsules (2 g total) by mouth 2 (two) times daily., Disp: 120 capsule, Rfl: 11 ?  pantoprazole (PROTONIX) 40 MG tablet, Take 40 mg by  mouth in the morning., Disp: , Rfl:  ?  rosuvastatin (CRESTOR) 40 MG tablet, Take 1 tablet (40 mg total) by mouth daily., Disp: 90 tablet, Rfl: 3 ?  sacubitril-valsartan (ENTRESTO) 24-26 MG, Take 1 tablet by mouth 2 (two) times daily., Disp: 180 tablet, Rfl: 3 ?  senna (SENOKOT) 8.6 MG tablet, Take 4 tablets by mouth at bedtime., Disp: , Rfl:  ?  vitamin B-12 (CYANOCOBALAMIN) 1000 MCG tablet, Take 1 tablet (1,000 mcg total) by mouth daily., Disp: 30 tablet, Rfl: 1 ? ?Past Medical History: ?Past Medical History:  ?Diagnosis Date  ? Coronary artery disease   ? a. 10/2011 Cath: LM 85, LAD 20p, D1 20, LCX 20/56m OM1 85, RCA 60ost, 275m50d; b. 10/2011 CABG x 3: LIMA->LAD, RIMA->dRCA, L Radial->OM2; c. 08/2015 MV: EF 33%, apical ant, apical septal, apical infarct, no ischemia.  ? Family history of coronary artery disease 10/23/2011  ? HFrEF (heart failure with reduced ejection fraction) [place]  ? a. 02/2017 cMRI: EF 43%, mod MR, minimal LGE, nl RV size/fxn; b. 04/2019 Echo: EF 25-30%, glob HK, gr2 DD, mod reduced RV fxn, triv MR/MR, Ao root 3928m ? Hodgkin's [place]  ? neck/midchest area with radiation  ? Hyperlipidemia 10/23/2011  ? Hypertension   ? Ischemic cardiomyopathy   ? a. 08/2016 Echo: EF 20-25%; b. 02/2017 cMRI: EF 43% (no indication for ICD based on  this finding); c. 04/2019 Echo: EF 25-30%.  ? Left main coronary artery disease 10/22/2011  ? S/P CABG x 3 10/24/2011  ? LIMA to LAD, RIMA to RCA, LRA to OM2  ? S/P radiation therapy 10/21/1992  ? Radiation therapy to chest and neck for Hodgkin's lymphoma  ? Unstable angina ([place]) 10/22/2011  ? ? ?Tobacco Use: ?Social History  ? ?Tobacco Use  ?Smoking Status Former  ? Packs/day: 0.50  ? Types: Cigarettes  ? Quit date: 10/21/1996  ? Years since quitting: 24.5  ?Smokeless Tobacco Former  ? Types: Snuff  ? Quit date: 04/05/2019  ? ? ?Labs: ?Review Flowsheet   ? ?  ?  Latest Ref Rng & Units 10/22/2011 10/23/2011 10/24/2011 10/25/2011  ?Labs for ITP Cardiac and  Pulmonary Rehab  ?Cholestrol 100 - 199 mg/dL  245      ?LDL (calc) 0 - 99 mg/dL  166      ?Direct LDL 0 - 99 mg/dL      ?HDL-C >39 mg/dL  26      ?Trlycerides 0 - 149 mg/dL  265      ?Hemoglobin A1c <5.7 % 5.4       ?PH, Arterial 7.350 - 7.450   7.318    ? 7.315    ? 7.370    ? 7.341    ? 7.372     ?PCO2 arterial 35.0 - 45.0 mmHg   42.4    ? 42.9    ? 37.8    ? 23.8    ? 44.3     ?Bicarbonate 20.0 - 24.0 mEq/L   21.7    ? 21.8    ? 22.0    ? 13.1    ? 25.7     ?TCO2 0 - 100 mmol/L   22    ? 23    ? 23    ? 23    ? 14    ? 27   24    ?Acid-base deficit 0.0 - 2.0 mmol/L   4.0    ? 4.0    ? 3.0    ? 12.0     ?O2 Saturation %   97.0    ? 97.0    ? 97.0    ? 93.0    ? 100.0     ? ?  02/27/2021  ?Labs for ITP Cardiac and Pulmonary Rehab  ?Cholestrol 189    ?LDL (calc) 107    ?Direct LDL 119    ?HDL-C 32    ?Trlycerides 291    ?Hemoglobin A1c   ?PH, Arterial   ?PCO2 arterial   ?Bicarbonate   ?TCO2   ?Acid-base deficit   ?O2 Saturation   ?  ? ? Multiple values from one day are sorted in reverse-chronological order  ?  ?  ? ? ? ?Exercise Target Goals: ?Exercise Program Goal: ?Individual exercise prescription set using results from initial 6 min walk test and THRR while considering  patient?s activity barriers and safety.  ? ?Exercise Prescription Goal: ?Initial exercise prescription builds to 30-45 minutes a day of aerobic activity, 2-3 days per week.  Home exercise guidelines will be given to patient during program as part of exercise prescription that the participant will acknowledge. ? ? ?Education: Aerobic Exercise: ?- Group verbal and visual presentation on the components of exercise prescription. Introduces F.I.T.T principle from ACSM for exercise prescriptions.  Reviews F.I.T.T. principles of aerobic exercise including progression. Written material given at graduation. ?Flowsheet Row Cardiac Rehab from  04/24/2021 in [facility] Cardiac and Pulmonary Rehab  ?Date 03/06/21  ?Educator [facility]  ?Instruction Review Code 1- Verbalizes  Understanding  ? ?  ? ? ?Education: Resistance Exercise: ?- Group verbal and visual presentation on the components of exercise prescription. Introduces F.I.T.T principle from ACSM for exercise prescriptions  Reviews F.I.T.T. principles of resistance exercise including progression. Written material given at graduation. ?Flowsheet Row Cardiac Rehab from 04/24/2021 in [facility] Cardiac and Pulmonary Rehab  ?Date 03/13/21  ?Educator AS  ?Instruction Review Code 1- Verbalizes Understanding  ? ?  ? ?  ?Education: Exercise & Equipment Safety: ?- Individual verbal instruction and demonstration of equipment use and safety with use of the equipment. ?Flowsheet Row Cardiac Rehab from 04/24/2021 in [facility] Cardiac and Pulmonary Rehab  ?Education need identified 02/26/21  ?Date 02/26/21  ?Educator [place]  ?Instruction Review Code 1- Verbalizes Understanding  ? ?  ? ? ?Education: Exercise Physiology & General Exercise Guidelines: ?- Group verbal and written instruction with models to review the exercise physiology of the cardiovascular system and associated critical values. Provides general exercise guidelines with specific guidelines to those with heart or lung disease.  ? ? ?Education: Flexibility, Balance, Mind/Body Relaxation: ?- Group verbal and visual presentation with interactive activity on the components of exercise prescription. Introduces F.I.T.T principle from ACSM for exercise prescriptions. Reviews F.I.T.T. principles of flexibility and balance exercise training including progression. Also discusses the mind body connection.  Reviews various relaxation techniques to help reduce and manage stress (i.e. Deep breathing, progressive muscle relaxation, and visualization). Balance handout provided to take home. Written material given at graduation. ?Flowsheet Row Cardiac Rehab from 04/24/2021 in [facility] Cardiac and Pulmonary Rehab  ?Date 03/27/21  ?Educator AS  ?Instruction Review Code 1- Verbalizes Understanding  ? ?  ? ? ?Activity  Barriers & Risk Stratification: ? Activity Barriers & Cardiac Risk Stratification - 02/26/21 0947   ? ?  ? Activity Barriers & Cardiac Risk Stratification  ? Activity Barriers Decreased Ventricular Function   ? Musician

## 2021-04-24 NOTE — Progress Notes (Signed)
Daily Session Note ? ?Patient Details  ?Name: Willie [name] ?MRN: [id number] ?Date of Birth: 1965-05-13 ?Referring Provider:   ?Flowsheet Row Cardiac Rehab from 02/26/2021 in [facility] Cardiac and Pulmonary Rehab  ?Referring Provider End, Harrell Gave MD  ? ?  ? ? ?Encounter Date: 04/24/2021 ? ?Check In: ? Session Check In - 04/24/21 0740   ? ?  ? Check-In  ? Supervising physician immediately available to respond to emergencies See telemetry face sheet for immediately available ER MD   ? Location [facility]-Cardiac & Pulmonary Rehab   ? Staff Present Birdie Sons, MPA, RN;Joseph [place], RCP,RRT,BSRT;Amanda Sommer, BA, ACSM CEP, Exercise Physiologist   ? Virtual Visit No   ? Medication changes reported     No   ? Fall or balance concerns reported    No   ? Tobacco Cessation No Change   ? Warm-up and Cool-down Performed on first and last piece of equipment   ? Resistance Training Performed Yes   ? VAD Patient? No   ? PAD/SET Patient? No   ?  ? Pain Assessment  ? Currently in Pain? No/denies   ? ?  ?  ? ?  ? ? ? ? ? ?Social History  ? ?Tobacco Use  ?Smoking Status Former  ? Packs/day: 0.50  ? Types: Cigarettes  ? Quit date: 10/21/1996  ? Years since quitting: 24.5  ?Smokeless Tobacco Former  ? Types: Snuff  ? Quit date: 04/05/2019  ? ? ?Goals Met:  ?Independence with exercise equipment ?Exercise tolerated well ?No report of concerns or symptoms today ?Strength training completed today ? ?Goals Unmet:  ?Not Applicable ? ?Comments: Pt able to follow exercise prescription today without complaint.  Will continue to monitor for progression. ? ? ? ?Dr. Emily Filbert is Medical Director for [place].  ?Dr. Ottie Glazier is Medical Director for [facility] Pulmonary Rehabilitation. ?

## 2021-04-25 DIAGNOSIS — Z48812 Encounter for surgical aftercare following surgery on the circulatory system: Secondary | ICD-10-CM | POA: Diagnosis not present

## 2021-04-25 DIAGNOSIS — Z955 Presence of coronary angioplasty implant and graft: Secondary | ICD-10-CM

## 2021-04-25 NOTE — Progress Notes (Signed)
Daily Session Note ? ?Patient Details  ?Name: Willie [name] ?MRN: [id number] ?Date of Birth: 11/24/1965 ?Referring Provider:   ?Flowsheet Row Cardiac Rehab from 02/26/2021 in [facility] Cardiac and Pulmonary Rehab  ?Referring Provider End, Harrell Gave MD  ? ?  ? ? ?Encounter Date: 04/25/2021 ? ?Check In: ? Session Check In - 04/25/21 0714   ? ?  ? Check-In  ? Supervising physician immediately available to respond to emergencies See telemetry face sheet for immediately available ER MD   ? Location [facility]-Cardiac & Pulmonary Rehab   ? Staff Present Birdie Sons, MPA, [place], BS, ACSM CEP, Exercise Physiologist;Amanda Oletta Darter, BA, ACSM CEP, Exercise Physiologist   ? Virtual Visit No   ? Medication changes reported     No   ? Fall or balance concerns reported    No   ? Tobacco Cessation No Change   ? Warm-up and Cool-down Performed on first and last piece of equipment   ? Resistance Training Performed Yes   ? VAD Patient? No   ? PAD/SET Patient? No   ?  ? Pain Assessment  ? Currently in Pain? No/denies   ? ?  ?  ? ?  ? ? ? ? ? ?Social History  ? ?Tobacco Use  ?Smoking Status Former  ? Packs/day: 0.50  ? Types: Cigarettes  ? Quit date: 10/21/1996  ? Years since quitting: 24.5  ?Smokeless Tobacco Former  ? Types: Snuff  ? Quit date: 04/05/2019  ? ? ?Goals Met:  ?Independence with exercise equipment ?Exercise tolerated well ?No report of concerns or symptoms today ?Strength training completed today ? ?Goals Unmet:  ?Not Applicable ? ?Comments: Pt able to follow exercise prescription today without complaint.  Will continue to monitor for progression. ? ? ? ?Dr. Emily Filbert is Medical Director for [place].  ?Dr. Ottie Glazier is Medical Director for [facility] Pulmonary Rehabilitation. ?

## 2021-04-29 ENCOUNTER — Encounter: Payer: Managed Care, Other (non HMO) | Admitting: *Deleted

## 2021-04-29 DIAGNOSIS — Z48812 Encounter for surgical aftercare following surgery on the circulatory system: Secondary | ICD-10-CM | POA: Diagnosis not present

## 2021-04-29 DIAGNOSIS — Z955 Presence of coronary angioplasty implant and graft: Secondary | ICD-10-CM

## 2021-04-29 NOTE — Progress Notes (Signed)
Daily Session Note ? ?Patient Details  ?Name: Willie [name] ?MRN: [id number] ?Date of Birth: 08/21/1965 ?Referring Provider:   ?Flowsheet Row Cardiac Rehab from 02/26/2021 in [facility] Cardiac and Pulmonary Rehab  ?Referring Provider End, Harrell Gave MD  ? ?  ? ? ?Encounter Date: 04/29/2021 ? ?Check In: ? Session Check In - 04/29/21 0806   ? ?  ? Check-In  ? Supervising physician immediately available to respond to emergencies See telemetry face sheet for immediately available ER MD   ? Location [facility]-Cardiac & Pulmonary Rehab   ? Staff Present Heath Lark, RN, BSN, [place], BS, ACSM CEP, Exercise Physiologist;Joseph [place], [place]   ? Virtual Visit No   ? Medication changes reported     No   ? Fall or balance concerns reported    No   ? Warm-up and Cool-down Performed on first and last piece of equipment   ? Resistance Training Performed Yes   ? VAD Patient? No   ? PAD/SET Patient? No   ?  ? Pain Assessment  ? Currently in Pain? No/denies   ? ?  ?  ? ?  ? ? ? ? ? ?Social History  ? ?Tobacco Use  ?Smoking Status Former  ? Packs/day: 0.50  ? Types: Cigarettes  ? Quit date: 10/21/1996  ? Years since quitting: 24.5  ?Smokeless Tobacco Former  ? Types: Snuff  ? Quit date: 04/05/2019  ? ? ?Goals Met:  ?Independence with exercise equipment ?Exercise tolerated well ?No report of concerns or symptoms today ? ?Goals Unmet:  ?Not Applicable ? ?Comments: Pt able to follow exercise prescription today without complaint.  Will continue to monitor for progression. ? ? ? ?Dr. Emily Filbert is Medical Director for [place].  ?Dr. Ottie Glazier is Medical Director for [facility] Pulmonary Rehabilitation. ?

## 2021-05-01 VITALS — Ht 70.0 in | Wt 214.3 lb

## 2021-05-01 DIAGNOSIS — Z48812 Encounter for surgical aftercare following surgery on the circulatory system: Secondary | ICD-10-CM | POA: Diagnosis not present

## 2021-05-01 DIAGNOSIS — Z955 Presence of coronary angioplasty implant and graft: Secondary | ICD-10-CM

## 2021-05-01 NOTE — Progress Notes (Signed)
Daily Session Note ? ?Patient Details  ?Name: Willie [name] ?MRN: [id number] ?Date of Birth: [birth year] ?Referring Provider:   ?Flowsheet Row Cardiac Rehab from 02/26/2021 in [facility] Cardiac and Pulmonary Rehab  ?Referring Provider End, Harrell Gave MD  ? ?  ? ? ?Encounter Date: 05/01/2021 ? ?Check In: ? Session Check In - 05/01/21 0733   ? ?  ? Check-In  ? Supervising physician immediately available to respond to emergencies See telemetry face sheet for immediately available ER MD   ? Location [facility]-Cardiac & Pulmonary Rehab   ? Staff Present Birdie Sons, MPA, RN;Joseph Glencoe, Sharren [place], MS, ASCM CEP, Exercise Physiologist;Jessica Luan Pulling, MA, RCEP, CCRP, CCET   ? Virtual Visit No   ? Medication changes reported     No   ? Fall or balance concerns reported    No   ? Tobacco Cessation No Change   ? Warm-up and Cool-down Performed on first and last piece of equipment   ? Resistance Training Performed Yes   ? VAD Patient? No   ? PAD/SET Patient? No   ?  ? Pain Assessment  ? Currently in Pain? No/denies   ? ?  ?  ? ?  ? ? ? ? ? ?Social History  ? ?Tobacco Use  ?Smoking Status Former  ? Packs/day: 0.50  ? Types: Cigarettes  ? Quit date: 10/21/1996  ? Years since quitting: 24.5  ?Smokeless Tobacco Former  ? Types: Snuff  ? Quit date: 04/05/2019  ? ? ?Goals Met:  ?Independence with exercise equipment ?Exercise tolerated well ?No report of concerns or symptoms today ?Strength training completed today ? ?Goals Unmet:  ?Not Applicable ? ?Comments: Pt able to follow exercise prescription today without complaint.  Will continue to monitor for progression. ? ? 6 Minute Walk   ? ? [name] Name 02/26/21 1149 05/01/21 0803  ?  ?  ? 6 Minute Walk  ? Phase Initial Discharge   ? Distance 1335 feet 1710 feet   ? Distance % Change -- 28.1 %   ? Distance Feet Change -- 375 ft   ? Walk Time 6 minutes 6 minutes   ? # of Rest Breaks 0 0   ? MPH 2.52 3.24   ? METS 3.41 4.24   ? RPE 7 11   ? Perceived Dyspnea  0 --   ? VO2 Peak 11.96  14.83   ? Symptoms No No   ? Resting HR 82 bpm 76 bpm   ? Resting BP 108/64 106/62   ? Resting Oxygen Saturation  98 % --   ? Exercise Oxygen Saturation  during 6 min walk 99 % --   ? Max Ex. HR 109 bpm 110 bpm   ? Max Ex. BP 116/70 128/74   ? 2 Minute Post BP 114/72 --   ? ?  ?  ? ?  ? ? ? ?Dr. Emily Filbert is Medical Director for [place].  ?Dr. Ottie Glazier is Medical Director for [facility] Pulmonary Rehabilitation. ?

## 2021-05-02 DIAGNOSIS — Z955 Presence of coronary angioplasty implant and graft: Secondary | ICD-10-CM

## 2021-05-02 DIAGNOSIS — Z48812 Encounter for surgical aftercare following surgery on the circulatory system: Secondary | ICD-10-CM | POA: Diagnosis not present

## 2021-05-02 NOTE — Progress Notes (Signed)
Daily Session Note ? ?Patient Details  ?Name: Willie [name] ?MRN: [id number] ?Date of Birth: 1965/12/20 ?Referring Provider:   ?Flowsheet Row Cardiac Rehab from 02/26/2021 in [facility] Cardiac and Pulmonary Rehab  ?Referring Provider End, Harrell Gave MD  ? ?  ? ? ?Encounter Date: 05/02/2021 ? ?Check In: ? Session Check In - 05/02/21 0724   ? ?  ? Check-In  ? Supervising physician immediately available to respond to emergencies See telemetry face sheet for immediately available ER MD   ? Location [facility]-Cardiac & Pulmonary Rehab   ? Staff Present Birdie Sons, MPA, RN;Joseph [place], RCP,RRT,BSRT;Melissa [place], RDN, LDN   ? Virtual Visit No   ? Medication changes reported     No   ? Fall or balance concerns reported    No   ? Tobacco Cessation No Change   ? Warm-up and Cool-down Performed on first and last piece of equipment   ? Resistance Training Performed Yes   ? VAD Patient? No   ? PAD/SET Patient? No   ?  ? Pain Assessment  ? Currently in Pain? No/denies   ? ?  ?  ? ?  ? ? ? ? ? ?Social History  ? ?Tobacco Use  ?Smoking Status Former  ? Packs/day: 0.50  ? Types: Cigarettes  ? Quit date: 10/21/1996  ? Years since quitting: 24.5  ?Smokeless Tobacco Former  ? Types: Snuff  ? Quit date: 04/05/2019  ? ? ?Goals Met:  ?Independence with exercise equipment ?Exercise tolerated well ?No report of concerns or symptoms today ?Strength training completed today ? ?Goals Unmet:  ?Not Applicable ? ?Comments: Pt able to follow exercise prescription today without complaint.  Will continue to monitor for progression. ? ? ? ?Dr. Emily Filbert is Medical Director for [place].  ?Dr. Ottie Glazier is Medical Director for [facility] Pulmonary Rehabilitation. ?

## 2021-05-02 NOTE — Patient Instructions (Signed)
Discharge Patient Instructions ? ?Patient Details  ?Name: Willie [name] ?MRN: [id number] ?Date of Birth: 1965/05/07 ?Referring Provider:  Nelva Bush, MD ? ? ?Number of Visits: 52 ? ?Reason for Discharge:  ?Patient reached a stable level of exercise. ?Patient independent in their exercise. ?Patient has met program and personal goals. ? ?Smoking History:  ?Social History  ? ?Tobacco Use  ?Smoking Status Former  ? Packs/day: 0.50  ? Types: Cigarettes  ? Quit date: 10/21/1996  ? Years since quitting: 24.5  ?Smokeless Tobacco Former  ? Types: Snuff  ? Quit date: 04/05/2019  ? ? ?Diagnosis:  ?Status post coronary artery stent placement ? ?Initial Exercise Prescription: ? Initial Exercise Prescription - 02/26/21 1100   ? ?  ? Date of Initial Exercise RX and Referring Provider  ? Date 02/26/21   ? Referring Provider End, Harrell Gave MD   ?  ? Oxygen  ? Maintain Oxygen Saturation 88% or higher   ?  ? Treadmill  ? MPH 2.7   ? Grade 1   ? Minutes 15   ? METs 3.44   ?  ? Recumbant Bike  ? Level 3   ? RPM 60   ? Watts 25   ? Minutes 15   ? METs 3.4   ?  ? REL-XR  ? Level 3   ? Speed 50   ? Minutes 15   ? METs 3.4   ?  ? Track  ? Laps 37   ? Minutes 15   ? METs 3.01   ?  ? Prescription Details  ? Frequency (times per week) 3   ? Duration Progress to 30 minutes of continuous aerobic without signs/symptoms of physical distress   ?  ? Intensity  ? THRR 40-80% of Max Heartrate 114-146   ? Ratings of Perceived Exertion 11-13   ? Perceived Dyspnea 0-4   ?  ? Progression  ? Progression Continue to progress workloads to maintain intensity without signs/symptoms of physical distress.   ?  ? Resistance Training  ? Training Prescription Yes   ? Weight 5 lb   ? Reps 10-15   ? ?  ?  ? ?  ? ? ?Discharge Exercise Prescription (Final Exercise Prescription Changes): ? Exercise Prescription Changes - 04/29/21 0900   ? ?  ? Response to Exercise  ? Blood Pressure (Admit) 102/72   ? Blood Pressure (Exit) 104/62   ? Heart Rate (Admit) 83 bpm   ?  Heart Rate (Exercise) 109 bpm   ? Heart Rate (Exit) 78 bpm   ? Rating of Perceived Exertion (Exercise) 15   ? Symptoms none   ? Duration Continue with 30 min of aerobic exercise without signs/symptoms of physical distress.   ? Intensity THRR unchanged   ?  ? Progression  ? Progression Continue to progress workloads to maintain intensity without signs/symptoms of physical distress.   ? Average METs 4.41   ?  ? Resistance Training  ? Training Prescription Yes   ? Weight 8 lb   ? Reps 10-15   ?  ? Interval Training  ? Interval Training Yes   ? Equipment REL-XR   ?  ? Treadmill  ? MPH 3   ? Grade 7.5   ? Minutes 15   ? METs 6.4   ?  ? Recumbant Elliptical  ? Level 3.2   ? Minutes 15   ? METs 3   ?  ? Elliptical  ? Level 5   ? Minutes  15   ? METs 3.6   ?  ? REL-XR  ? Level 7   intervals  ? Minutes 15   ? METs 5.8   ?  ? Home Exercise Plan  ? Plans to continue exercise at Home (comment)   walking  ? Frequency Add 2 additional days to program exercise sessions.   ? Initial Home Exercises Provided 03/11/21   ?  ? Oxygen  ? Maintain Oxygen Saturation 88% or higher   ? ?  ?  ? ?  ? ? ?Functional Capacity: ? 6 Minute Walk   ? ? [place] Name 02/26/21 1149 05/01/21 0803  ?  ?  ? 6 Minute Walk  ? Phase Initial Discharge   ? Distance 1335 feet 1710 feet   ? Distance % Change -- 28.1 %   ? Distance Feet Change -- 375 ft   ? Walk Time 6 minutes 6 minutes   ? # of Rest Breaks 0 0   ? MPH 2.52 3.24   ? METS 3.41 4.24   ? RPE 7 11   ? Perceived Dyspnea  0 --   ? VO2 Peak 11.96 14.83   ? Symptoms No No   ? Resting HR 82 bpm 76 bpm   ? Resting BP 108/64 106/62   ? Resting Oxygen Saturation  98 % --   ? Exercise Oxygen Saturation  during 6 min walk 99 % --   ? Max Ex. HR 109 bpm 110 bpm   ? Max Ex. BP 116/70 128/74   ? 2 Minute Post BP 114/72 --   ? ?  ?  ? ?  ? ? ? ? ?Nutrition & Weight - Outcomes: ? Pre Biometrics - 02/26/21 0946   ? ?  ? Pre Biometrics  ? Height _0  (1.778 m)   ? Weight 220 lb 11.2 oz (100.1 kg)   ? BMI (Calculated)  31.67   ? Single Leg Stand 30 seconds   ? ?  ?  ? ?  ? ? Post Biometrics - 05/01/21 0804   ? ?  ?  Post  Biometrics  ? Height _1  (1.778 m)   ? Weight 214 lb 4.8 oz (97.2 kg)   ? BMI (Calculated) 30.75   ? Single Leg Stand 30 seconds   ? ?  ?  ? ?  ? ? ?Nutrition: ? Nutrition Therapy & Goals - 03/28/21 1430   ? ?  ? Nutrition Therapy  ? Diet Heart healthy, low Na   ? Drug/Food Interactions Statins/Certain Fruits   ? Protein (specify units) 70g   ? Fiber 30 grams   ? Whole Grain Foods 3 servings   ? Saturated Fats 16 max. grams   ? Fruits and Vegetables 8 servings/day   ? Sodium 2 grams   ?  ? Personal Nutrition Goals  ? Nutrition Goal ST: eat a rainbow of fruits and vegetables per week, add fruit serving to breakfast or lunch LT: increase vegetable intake to 8 servings per day, increase variety of non-starchy vegetables   ? Comments 56 y.o. M admitted to cardiac rehab s/p stent placement. PMHx includes CAD, HFrEF, HTN, HLD, s/p CABG (2013). Relevant medications includes coreg, lasix, protonix, crestor, senokot, vit B12.  PYP Score: 55. Vegetables & Fruits 5/12. Breads, Grains & Cereals 7/12. Red & Processed Meat 6/12. Poultry 2/2. Fish & Shellfish 1/4. Beans, Nuts & Seeds 2/4. Milk & Dairy Foods 2/6. Toppings, Oils, Seasonings & Salt 10/20. Sweets, Snacks &  Restaurant Food 12/14. Beverages 8/10. He has been reducing salt. He reports since CABG 3x he has been limiting portion sizes, he is honoring his hunger still. B: [place] sausage and [place] bacon with 2 eggs L: peanut butter or [place] sandwich (whole wheat bread) D: beans, green beans, collard greens, butter beans, corn, cubesteak or hamburger or meatloaf, chicken mostly, fish (catfish), very little pork. Uses olive oil with cooking and sometimes butter. They do not add salt to their food. Drinks: water, diet pepsi sometimes. Reviewed heart healthy eating.   ?  ? Intervention Plan  ? Intervention Prescribe, educate and counsel regarding individualized specific  dietary modifications aiming towards targeted core components such as weight, hypertension, lipid management, diabetes, heart failure and other comorbidities.   ? Expected Outcomes Short Term Goal: Understand basic principles of dietary content, such as calories, fat, sodium, cholesterol and nutrients.;Short Term Goal: A plan has been developed with personal nutrition goals set during dietitian appointment.;Long Term Goal: Adherence to prescribed nutrition plan.   ? ?  ?  ? ?  ? ? ? ?Goals reviewed with patient; copy given to patient. ?

## 2021-05-06 ENCOUNTER — Encounter: Payer: Managed Care, Other (non HMO) | Attending: Internal Medicine | Admitting: *Deleted

## 2021-05-06 DIAGNOSIS — Z955 Presence of coronary angioplasty implant and graft: Secondary | ICD-10-CM | POA: Diagnosis present

## 2021-05-06 DIAGNOSIS — I5022 Chronic systolic (congestive) heart failure: Secondary | ICD-10-CM | POA: Insufficient documentation

## 2021-05-06 DIAGNOSIS — Z48812 Encounter for surgical aftercare following surgery on the circulatory system: Secondary | ICD-10-CM | POA: Insufficient documentation

## 2021-05-06 NOTE — Progress Notes (Signed)
Daily Session Note ? ?Patient Details  ?Name: Willie [name] ?MRN: [id number] ?Date of Birth: September 09, 1965 ?Referring Provider:   ?Flowsheet Row Cardiac Rehab from 02/26/2021 in [facility] Cardiac and Pulmonary Rehab  ?Referring Provider End, Harrell Gave MD  ? ?  ? ? ?Encounter Date: 05/06/2021 ? ?Check In: ? Session Check In - 05/06/21 0815   ? ?  ? Check-In  ? Supervising physician immediately available to respond to emergencies See telemetry face sheet for immediately available ER MD   ? Location [facility]-Cardiac & Pulmonary Rehab   ? Staff Present Heath Lark, RN, BSN, Laveda Norman, BS, ACSM CEP, Exercise Physiologist;Joseph Alamillo, [place]   ? Virtual Visit No   ? Medication changes reported     No   ? Fall or balance concerns reported    No   ? Warm-up and Cool-down Performed on first and last piece of equipment   ? Resistance Training Performed Yes   ? VAD Patient? No   ? PAD/SET Patient? No   ?  ? Pain Assessment  ? Currently in Pain? No/denies   ? ?  ?  ? ?  ? ? ? ? ? ?Social History  ? ?Tobacco Use  ?Smoking Status Former  ? Packs/day: 0.50  ? Types: Cigarettes  ? Quit date: 10/21/1996  ? Years since quitting: 24.5  ?Smokeless Tobacco Former  ? Types: Snuff  ? Quit date: 04/05/2019  ? ? ?Goals Met:  ?Independence with exercise equipment ?Exercise tolerated well ?No report of concerns or symptoms today ? ?Goals Unmet:  ?Not Applicable ? ?Comments: Pt able to follow exercise prescription today without complaint.  Will continue to monitor for progression. ? ? ? ?Dr. Emily Filbert is Medical Director for [place].  ?Dr. Ottie Glazier is Medical Director for [facility] Pulmonary Rehabilitation. ?

## 2021-05-08 DIAGNOSIS — Z955 Presence of coronary angioplasty implant and graft: Secondary | ICD-10-CM

## 2021-05-08 DIAGNOSIS — Z48812 Encounter for surgical aftercare following surgery on the circulatory system: Secondary | ICD-10-CM | POA: Diagnosis not present

## 2021-05-08 NOTE — Progress Notes (Signed)
Discharge Progress Report ? ?Patient Details  ?Name: Willie [name] ?MRN: [id number] ?Date of Birth: 04-26-1965 ?Referring Provider:   ?Flowsheet Row Cardiac Rehab from 02/26/2021 in [facility] Cardiac and Pulmonary Rehab  ?Referring Provider End, Harrell Gave MD  ? ?  ? ? ? ?Number of Visits: 71 ? ?Reason for Discharge:  ?Patient reached a stable level of exercise. ?Patient independent in their exercise. ?Patient has met program and personal goals. ? ?Smoking History:  ?Social History  ? ?Tobacco Use  ?Smoking Status Former  ? Packs/day: 0.50  ? Types: Cigarettes  ? Quit date: 10/21/1996  ? Years since quitting: 24.5  ?Smokeless Tobacco Former  ? Types: Snuff  ? Quit date: 04/05/2019  ? ? ?Diagnosis:  ?Status post coronary artery stent placement ? ?ADL UCSD: ? ? ?Initial Exercise Prescription: ? Initial Exercise Prescription - 02/26/21 1100   ? ?  ? Date of Initial Exercise RX and Referring Provider  ? Date 02/26/21   ? Referring Provider End, Harrell Gave MD   ?  ? Oxygen  ? Maintain Oxygen Saturation 88% or higher   ?  ? Treadmill  ? MPH 2.7   ? Grade 1   ? Minutes 15   ? METs 3.44   ?  ? Recumbant Bike  ? Level 3   ? RPM 60   ? Watts 25   ? Minutes 15   ? METs 3.4   ?  ? REL-XR  ? Level 3   ? Speed 50   ? Minutes 15   ? METs 3.4   ?  ? Track  ? Laps 37   ? Minutes 15   ? METs 3.01   ?  ? Prescription Details  ? Frequency (times per week) 3   ? Duration Progress to 30 minutes of continuous aerobic without signs/symptoms of physical distress   ?  ? Intensity  ? THRR 40-80% of Max Heartrate 114-146   ? Ratings of Perceived Exertion 11-13   ? Perceived Dyspnea 0-4   ?  ? Progression  ? Progression Continue to progress workloads to maintain intensity without signs/symptoms of physical distress.   ?  ? Resistance Training  ? Training Prescription Yes   ? Weight 5 lb   ? Reps 10-15   ? ?  ?  ? ?  ? ? ?Discharge Exercise Prescription (Final Exercise Prescription Changes): ? Exercise Prescription Changes - 04/29/21 0900   ? ?  ?  Response to Exercise  ? Blood Pressure (Admit) 102/72   ? Blood Pressure (Exit) 104/62   ? Heart Rate (Admit) 83 bpm   ? Heart Rate (Exercise) 109 bpm   ? Heart Rate (Exit) 78 bpm   ? Rating of Perceived Exertion (Exercise) 15   ? Symptoms none   ? Duration Continue with 30 min of aerobic exercise without signs/symptoms of physical distress.   ? Intensity THRR unchanged   ?  ? Progression  ? Progression Continue to progress workloads to maintain intensity without signs/symptoms of physical distress.   ? Average METs 4.41   ?  ? Resistance Training  ? Training Prescription Yes   ? Weight 8 lb   ? Reps 10-15   ?  ? Interval Training  ? Interval Training Yes   ? Equipment REL-XR   ?  ? Treadmill  ? MPH 3   ? Grade 7.5   ? Minutes 15   ? METs 6.4   ?  ? Recumbant Elliptical  ? Level  3.2   ? Minutes 15   ? METs 3   ?  ? Elliptical  ? Level 5   ? Minutes 15   ? METs 3.6   ?  ? REL-XR  ? Level 7   intervals  ? Minutes 15   ? METs 5.8   ?  ? Home Exercise Plan  ? Plans to continue exercise at Home (comment)   walking  ? Frequency Add 2 additional days to program exercise sessions.   ? Initial Home Exercises Provided 03/11/21   ?  ? Oxygen  ? Maintain Oxygen Saturation 88% or higher   ? ?  ?  ? ?  ? ? ?Functional Capacity: ? 6 Minute Walk   ? ? [place] Name 02/26/21 1149 05/01/21 0803  ?  ?  ? 6 Minute Walk  ? Phase Initial Discharge   ? Distance 1335 feet 1710 feet   ? Distance % Change -- 28.1 %   ? Distance Feet Change -- 375 ft   ? Walk Time 6 minutes 6 minutes   ? # of Rest Breaks 0 0   ? MPH 2.52 3.24   ? METS 3.41 4.24   ? RPE 7 11   ? Perceived Dyspnea  0 --   ? VO2 Peak 11.96 14.83   ? Symptoms No No   ? Resting HR 82 bpm 76 bpm   ? Resting BP 108/64 106/62   ? Resting Oxygen Saturation  98 % --   ? Exercise Oxygen Saturation  during 6 min walk 99 % --   ? Max Ex. HR 109 bpm 110 bpm   ? Max Ex. BP 116/70 128/74   ? 2 Minute Post BP 114/72 --   ? ?  ?  ? ?  ? ? ?Psychological, QOL, Others - Outcomes: ?PHQ 2/9: ? ?   05/06/2021  ?  7:42 AM 02/26/2021  ?  9:45 AM  ?Depression screen PHQ 2/9  ?Decreased Interest 1 0  ?Down, Depressed, Hopeless 2 0  ?PHQ - 2 Score 3 0  ?Altered sleeping 0 0  ?Tired, decreased energy 3 0  ?Change in appetite 0 0  ?Feeling bad or failure about yourself  2 0  ?Trouble concentrating 2 0  ?Moving slowly or fidgety/restless 0 0  ?Suicidal thoughts 0 0  ?PHQ-9 Score 10 0  ?Difficult doing work/chores  Not difficult at all  ? ? ?Quality of Life: ? Quality of Life - 05/06/21 0739   ? ?  ? Quality of Life Scores  ? Health/Function Pre 28.4 %   ? Health/Function Post 24.8 %   ? Health/Function % Change -12.68 %   ? Socioeconomic Pre 30 %   ? Socioeconomic Post 25.71 %   ? Socioeconomic % Change  -14.3 %   ? Psych/Spiritual Pre 30 %   ? Psych/Spiritual Post 28.29 %   ? Psych/Spiritual % Change -5.7 %   ? Family Pre 30 %   ? Family Post 30 %   ? Family % Change 0 %   ? GLOBAL Pre 29.27 %   ? GLOBAL Post 26.47 %   ? GLOBAL % Change -9.57 %   ? ?  ?  ? ?  ? ? ? ? ? ?Nutrition & Weight - Outcomes: ? Pre Biometrics - 02/26/21 0946   ? ?  ? Pre Biometrics  ? Height 5' 10" (1.778 m)   ? Weight 220 lb 11.2 oz (100.1 kg)   ?  BMI (Calculated) 31.67   ? Single Leg Stand 30 seconds   ? ?  ?  ? ?  ? ? Post Biometrics - 05/01/21 0804   ? ?  ?  Post  Biometrics  ? Height 5' 10" (1.778 m)   ? Weight 214 lb 4.8 oz (97.2 kg)   ? BMI (Calculated) 30.75   ? Single Leg Stand 30 seconds   ? ?  ?  ? ?  ? ? ?Nutrition: ? Nutrition Therapy & Goals - 03/28/21 1430   ? ?  ? Nutrition Therapy  ? Diet Heart healthy, low Na   ? Drug/Food Interactions Statins/Certain Fruits   ? Protein (specify units) 70g   ? Fiber 30 grams   ? Whole Grain Foods 3 servings   ? Saturated Fats 16 max. grams   ? Fruits and Vegetables 8 servings/day   ? Sodium 2 grams   ?  ? Personal Nutrition Goals  ? Nutrition Goal ST: eat a rainbow of fruits and vegetables per week, add fruit serving to breakfast or lunch LT: increase vegetable intake to 8 servings per day,  increase variety of non-starchy vegetables   ? Comments 56 y.o. M admitted to cardiac rehab s/p stent placement. PMHx includes CAD, HFrEF, HTN, HLD, s/p CABG (2013). Relevant medications includes coreg, lasix, protonix, crestor, senokot, vit B12.  PYP Score: 55. Vegetables & Fruits 5/12. Breads, Grains & Cereals 7/12. Red & Processed Meat 6/12. Poultry 2/2. Fish & Shellfish 1/4. Beans, Nuts & Seeds 2/4. Milk & Dairy Foods 2/6. Toppings, Oils, Seasonings & Salt 10/20. Sweets, Snacks & Restaurant Food 12/14. Beverages 8/10. He has been reducing salt. He reports since CABG 3x he has been limiting portion sizes, he is honoring his hunger still. B: [place] sausage and [place] bacon with 2 eggs L: peanut butter or [place] sandwich (whole wheat bread) D: beans, green beans, collard greens, butter beans, corn, cubesteak or hamburger or meatloaf, chicken mostly, fish (catfish), very little pork. Uses olive oil with cooking and sometimes butter. They do not add salt to their food. Drinks: water, diet pepsi sometimes. Reviewed heart healthy eating.   ?  ? Intervention Plan  ? Intervention Prescribe, educate and counsel regarding individualized specific dietary modifications aiming towards targeted core components such as weight, hypertension, lipid management, diabetes, heart failure and other comorbidities.   ? Expected Outcomes Short Term Goal: Understand basic principles of dietary content, such as calories, fat, sodium, cholesterol and nutrients.;Short Term Goal: A plan has been developed with personal nutrition goals set during dietitian appointment.;Long Term Goal: Adherence to prescribed nutrition plan.   ? ?  ?  ? ?  ? ? ?Nutrition Discharge: ? ? ?Education Questionnaire Score: ? Knowledge Questionnaire Score - 05/06/21 0738   ? ?  ? Knowledge Questionnaire Score  ? Pre Score 24/26: Nutrition, [place]   ? Post Score 25/26   ? ?  ?  ? ?  ? ? ?Goals reviewed with patient; copy given to patient. ?

## 2021-05-08 NOTE — Progress Notes (Signed)
Daily Session Note ? ?Patient Details  ?Name: Willie [name] ?MRN: [id number] ?Date of Birth: [birth year] ?Referring Provider:   ?Flowsheet Row Cardiac Rehab from 02/26/2021 in [facility] Cardiac and Pulmonary Rehab  ?Referring Provider End, Harrell Gave MD  ? ?  ? ? ?Encounter Date: 05/08/2021 ? ?Check In: ? Session Check In - 05/08/21 0740   ? ?  ? Check-In  ? Supervising physician immediately available to respond to emergencies See telemetry face sheet for immediately available ER MD   ? Location [facility]-Cardiac & Pulmonary Rehab   ? Staff Present Birdie Sons, MPA, RN;Melissa [place], RDN, LDN;Joseph [place], RCP,RRT,BSRT   ? Virtual Visit No   ? Medication changes reported     No   ? Fall or balance concerns reported    No   ? Tobacco Cessation No Change   ? Warm-up and Cool-down Performed on first and last piece of equipment   ? Resistance Training Performed Yes   ? VAD Patient? No   ? PAD/SET Patient? No   ?  ? Pain Assessment  ? Currently in Pain? No/denies   ? ?  ?  ? ?  ? ? ? ? ? ?Social History  ? ?Tobacco Use  ?Smoking Status Former  ? Packs/day: 0.50  ? Types: Cigarettes  ? Quit date: 10/21/1996  ? Years since quitting: 24.5  ?Smokeless Tobacco Former  ? Types: Snuff  ? Quit date: 04/05/2019  ? ? ?Goals Met:  ?Independence with exercise equipment ?Exercise tolerated well ?No report of concerns or symptoms today ?Strength training completed today ? ?Goals Unmet:  ?Not Applicable ? ?Comments:  Willie [name] graduated today from  rehab with 36 sessions completed.  Details of the patient's exercise prescription and what He needs to do in order to continue the prescription and progress were discussed with patient.  Patient was given a copy of prescription and goals.  Patient verbalized understanding.  Willie [name] plans to continue to exercise by walking at home. Willie [name] stated that his higher score on the PHQ evaluation is related to his job loss.  ? ? ? ? ?Dr. Emily Filbert is Medical Director for [place].  ?Dr. Ottie Glazier is Medical Director for [facility] Pulmonary Rehabilitation. ?

## 2021-05-08 NOTE — Progress Notes (Signed)
Cardiac Individual Treatment Plan ? ?Patient Details  ?Name: Willie [name] ?MRN: [id number] ?Date of Birth: 1965/10/29 ?Referring Provider:   ?Flowsheet Row Cardiac Rehab from 02/26/2021 in [facility] Cardiac and Pulmonary Rehab  ?Referring Provider End, Harrell Gave MD  ? ?  ? ? ?Initial Encounter Date:  ?Flowsheet Row Cardiac Rehab from 02/26/2021 in [facility] Cardiac and Pulmonary Rehab  ?Date 02/26/21  ? ?  ? ? ?Visit Diagnosis: Status post coronary artery stent placement ? ?Patient's Home Medications on Admission: ? ?Current Outpatient Medications:  ?  allopurinol (ZYLOPRIM) 300 MG tablet, Take 300 mg by mouth in the morning. (Patient not taking: Reported on 04/12/2021), Disp: , Rfl:  ?  aspirin EC 81 MG tablet, Take 81 mg by mouth every evening., Disp: , Rfl:  ?  carvedilol (COREG) 3.125 MG tablet, Take 1 tablet (3.125 mg total) by mouth 2 (two) times daily., Disp: 180 tablet, Rfl: 3 ?  clopidogrel (PLAVIX) 75 MG tablet, Take 1 tablet (75 mg total) by mouth daily with breakfast., Disp: 90 tablet, Rfl: 3 ?  colchicine 0.6 MG tablet, Take 0.6 mg by mouth daily as needed (GOUT FLARES). (Patient not taking: Reported on 04/12/2021), Disp: , Rfl:  ?  dapagliflozin propanediol (FARXIGA) 10 MG TABS tablet, Take 1 tablet (10 mg total) by mouth daily with breakfast., Disp: 90 tablet, Rfl: 3 ?  eplerenone (INSPRA) 25 MG tablet, Take 0.5 tablets (12.5 mg total) by mouth at bedtime., Disp: 15 tablet, Rfl: 11 ?  Evolocumab (REPATHA SURECLICK) 638 MG/ML SOAJ, Inject 1 pen. into the skin every 14 (fourteen) days., Disp: 6 mL, Rfl: 3 ?  ezetimibe (ZETIA) 10 MG tablet, Take 1 tablet (10 mg total) by mouth daily., Disp: 90 tablet, Rfl: 3 ?  furosemide (LASIX) 40 MG tablet, Take 0.5 tablets (20 mg total) by mouth daily as needed for fluid., Disp: 30 tablet, Rfl: 3 ?  icosapent Ethyl (VASCEPA) 1 g capsule, Take 2 capsules (2 g total) by mouth 2 (two) times daily., Disp: 120 capsule, Rfl: 11 ?  pantoprazole (PROTONIX) 40 MG tablet, Take 40 mg by  mouth in the morning., Disp: , Rfl:  ?  rosuvastatin (CRESTOR) 40 MG tablet, Take 1 tablet (40 mg total) by mouth daily., Disp: 90 tablet, Rfl: 3 ?  sacubitril-valsartan (ENTRESTO) 24-26 MG, Take 1 tablet by mouth 2 (two) times daily., Disp: 180 tablet, Rfl: 3 ?  senna (SENOKOT) 8.6 MG tablet, Take 4 tablets by mouth at bedtime., Disp: , Rfl:  ?  vitamin B-12 (CYANOCOBALAMIN) 1000 MCG tablet, Take 1 tablet (1,000 mcg total) by mouth daily., Disp: 30 tablet, Rfl: 1 ? ?Past Medical History: ?Past Medical History:  ?Diagnosis Date  ? Coronary artery disease   ? a. 10/2011 Cath: LM 85, LAD 20p, D1 20, LCX 20/59m OM1 85, RCA 60ost, 264m50d; b. 10/2011 CABG x 3: LIMA->LAD, RIMA->dRCA, L Radial->OM2; c. 08/2015 MV: EF 33%, apical ant, apical septal, apical infarct, no ischemia.  ? Family history of coronary artery disease 10/23/2011  ? [place]heart [place]  ? a. 02/2017 cMRI: EF 43%, mod MR, minimal LGE, nl RV size/fxn; b. 04/2019 Echo: EF 25-30%, glob HK, gr2 DD, mod reduced RV fxn, triv MR/MR, Ao root 3970m ? [place]  ? neck/midchest area with radiation  ? Hyperlipidemia 10/23/2011  ? Hypertension   ? Ischemic cardiomyopathy   ? a. 08/2016 Echo: EF 20-25%; b. 02/2017 cMRI: EF 43% (no indication for ICD based on  this finding); c. 04/2019 Echo: EF 25-30%.  ? Left main coronary artery disease 10/22/2011  ? S/P CABG x 3 10/24/2011  ? LIMA to LAD, RIMA to RCA, LRA to OM2  ? S/P radiation therapy 10/21/1992  ? Radiation therapy to chest and neck for Hodgkin's lymphoma  ? Unstable angina ([place]) 10/22/2011  ? ? ?Tobacco Use: ?Social History  ? ?Tobacco Use  ?Smoking Status Former  ? Packs/day: 0.50  ? Types: Cigarettes  ? Quit date: 10/21/1996  ? Years since quitting: 24.5  ?Smokeless Tobacco Former  ? Types: Snuff  ? Quit date: 04/05/2019  ? ? ?Labs: ?Review Flowsheet   ? ?  ?  Latest Ref Rng & Units 10/22/2011 10/23/2011 10/24/2011 10/25/2011  ?Labs for ITP Cardiac and  Pulmonary Rehab  ?Cholestrol 100 - 199 mg/dL  245      ?LDL (calc) 0 - 99 mg/dL  166      ?Direct LDL 0 - 99 mg/dL      ?HDL-C >39 mg/dL  26      ?Trlycerides 0 - 149 mg/dL  265      ?Hemoglobin A1c <5.7 % 5.4       ?PH, Arterial 7.350 - 7.450   7.318    ? 7.315    ? 7.370    ? 7.341    ? 7.372     ?PCO2 arterial 35.0 - 45.0 mmHg   42.4    ? 42.9    ? 37.8    ? 23.8    ? 44.3     ?Bicarbonate 20.0 - 24.0 mEq/L   21.7    ? 21.8    ? 22.0    ? 13.1    ? 25.7     ?TCO2 0 - 100 mmol/L   22    ? 23    ? 23    ? 23    ? 14    ? 27   24    ?Acid-base deficit 0.0 - 2.0 mmol/L   4.0    ? 4.0    ? 3.0    ? 12.0     ?O2 Saturation %   97.0    ? 97.0    ? 97.0    ? 93.0    ? 100.0     ? ?  02/27/2021  ?Labs for ITP Cardiac and Pulmonary Rehab  ?Cholestrol 189    ?LDL (calc) 107    ?Direct LDL 119    ?HDL-C 32    ?Trlycerides 291    ?Hemoglobin A1c   ?PH, Arterial   ?PCO2 arterial   ?Bicarbonate   ?TCO2   ?Acid-base deficit   ?O2 Saturation   ?  ? ? Multiple values from one day are sorted in reverse-chronological order  ?  ?  ? ? ? ?Exercise Target Goals: ?Exercise Program Goal: ?Individual exercise prescription set using results from initial 6 min walk test and THRR while considering  patient?s activity barriers and safety.  ? ?Exercise Prescription Goal: ?Initial exercise prescription builds to 30-45 minutes a day of aerobic activity, 2-3 days per week.  Home exercise guidelines will be given to patient during program as part of exercise prescription that the participant will acknowledge. ? ? ?Education: Aerobic Exercise: ?- Group verbal and visual presentation on the components of exercise prescription. Introduces F.I.T.T principle from ACSM for exercise prescriptions.  Reviews F.I.T.T. principles of aerobic exercise including progression. Written material given at graduation. ?Flowsheet Row Cardiac Rehab from  05/08/2021 in [facility] Cardiac and Pulmonary Rehab  ?Date 03/06/21  ?Educator [facility]  ?Instruction Review Code 1- Verbalizes  Understanding  ? ?  ? ? ?Education: Resistance Exercise: ?- Group verbal and visual presentation on the components of exercise prescription. Introduces F.I.T.T principle from ACSM for exercise prescriptions  Reviews F.I.T.T. principles of resistance exercise including progression. Written material given at graduation. ?Flowsheet Row Cardiac Rehab from 05/08/2021 in [facility] Cardiac and Pulmonary Rehab  ?Date 03/13/21  ?Educator AS  ?Instruction Review Code 1- Verbalizes Understanding  ? ?  ? ?  ?Education: Exercise & Equipment Safety: ?- Individual verbal instruction and demonstration of equipment use and safety with use of the equipment. ?Flowsheet Row Cardiac Rehab from 05/08/2021 in [facility] Cardiac and Pulmonary Rehab  ?Education need identified 02/26/21  ?Date 02/26/21  ?Educator KL  ?Instruction Review Code 1- Verbalizes Understanding  ? ?  ? ? ?Education: Exercise Physiology & General Exercise Guidelines: ?- Group verbal and written instruction with models to review the exercise physiology of the cardiovascular system and associated critical values. Provides general exercise guidelines with specific guidelines to those with heart or lung disease.  ?Flowsheet Row Cardiac Rehab from 05/08/2021 in [facility] Cardiac and Pulmonary Rehab  ?Date 05/01/21  ?Educator [facility]  ?Instruction Review Code 1- Verbalizes Understanding  ? ?  ? ? ?Education: Flexibility, Balance, Mind/Body Relaxation: ?- Group verbal and visual presentation with interactive activity on the components of exercise prescription. Introduces F.I.T.T principle from ACSM for exercise prescriptions. Reviews F.I.T.T. principles of flexibility and balance exercise training including progression. Also discusses the mind body connection.  Reviews various relaxation techniques to help reduce and manage stress (i.e. Deep breathing, progressive muscle relaxation, and visualization). Balance handout provided to take home. Written material given at graduation. ?Flowsheet Row Cardiac  Rehab from 05/08/2021 in [facility] Cardiac and Pulmonary Rehab  ?Date 03/27/21  ?Educator AS  ?Instruction Review Code 1- Verbalizes Understanding  ? ?  ? ? ?Activity Barriers & Risk Stratification: ? Activity Esmond Camper

## 2021-05-29 ENCOUNTER — Ambulatory Visit (HOSPITAL_COMMUNITY)
Admission: RE | Admit: 2021-05-29 | Discharge: 2021-05-29 | Disposition: A | Discharge status: Home / Self Care | Payer: Managed Care, Other (non HMO) | Source: Ambulatory Visit | Attending: Internal Medicine | Admitting: Internal Medicine

## 2021-05-29 ENCOUNTER — Encounter (HOSPITAL_COMMUNITY): Payer: Self-pay | Admitting: Cardiology

## 2021-05-29 ENCOUNTER — Ambulatory Visit (HOSPITAL_BASED_OUTPATIENT_CLINIC_OR_DEPARTMENT_OTHER)
Admission: RE | Admit: 2021-05-29 | Discharge: 2021-05-29 | Disposition: A | Discharge status: Home / Self Care | Payer: Managed Care, Other (non HMO) | Source: Ambulatory Visit | Attending: Cardiology | Admitting: Cardiology

## 2021-05-29 ENCOUNTER — Other Ambulatory Visit (HOSPITAL_COMMUNITY): Payer: Self-pay

## 2021-05-29 VITALS — BP 122/86 | HR 63 | Wt 214.4 lb

## 2021-05-29 DIAGNOSIS — I5022 Chronic systolic (congestive) heart failure: Secondary | ICD-10-CM | POA: Diagnosis not present

## 2021-05-29 DIAGNOSIS — Z79899 Other long term (current) drug therapy: Secondary | ICD-10-CM | POA: Insufficient documentation

## 2021-05-29 DIAGNOSIS — Z951 Presence of aortocoronary bypass graft: Secondary | ICD-10-CM | POA: Diagnosis not present

## 2021-05-29 DIAGNOSIS — Z7902 Long term (current) use of antithrombotics/antiplatelets: Secondary | ICD-10-CM | POA: Insufficient documentation

## 2021-05-29 DIAGNOSIS — Z955 Presence of coronary angioplasty implant and graft: Secondary | ICD-10-CM | POA: Diagnosis not present

## 2021-05-29 DIAGNOSIS — I340 Nonrheumatic mitral (valve) insufficiency: Secondary | ICD-10-CM | POA: Insufficient documentation

## 2021-05-29 DIAGNOSIS — Z7982 Long term (current) use of aspirin: Secondary | ICD-10-CM | POA: Insufficient documentation

## 2021-05-29 DIAGNOSIS — I255 Ischemic cardiomyopathy: Secondary | ICD-10-CM | POA: Diagnosis not present

## 2021-05-29 DIAGNOSIS — R0600 Dyspnea, unspecified: Secondary | ICD-10-CM | POA: Insufficient documentation

## 2021-05-29 DIAGNOSIS — Z7984 Long term (current) use of oral hypoglycemic drugs: Secondary | ICD-10-CM | POA: Diagnosis not present

## 2021-05-29 DIAGNOSIS — I447 Left bundle-branch block, unspecified: Secondary | ICD-10-CM | POA: Diagnosis not present

## 2021-05-29 DIAGNOSIS — I2510 Atherosclerotic heart disease of native coronary artery without angina pectoris: Secondary | ICD-10-CM

## 2021-05-29 DIAGNOSIS — I110 Hypertensive heart disease with heart failure: Secondary | ICD-10-CM | POA: Diagnosis not present

## 2021-05-29 DIAGNOSIS — R5383 Other fatigue: Secondary | ICD-10-CM | POA: Diagnosis present

## 2021-05-29 DIAGNOSIS — Z8616 Personal history of COVID-19: Secondary | ICD-10-CM | POA: Diagnosis not present

## 2021-05-29 DIAGNOSIS — E785 Hyperlipidemia, unspecified: Secondary | ICD-10-CM | POA: Diagnosis not present

## 2021-05-29 LAB — ECHOCARDIOGRAM COMPLETE
Area-P 1/2: 3.5 cm2
Calc EF: 29.1 %
S' Lateral: 4.6 cm
Single Plane A2C EF: 27.9 %
Single Plane A4C EF: 26.8 %

## 2021-05-29 MED ORDER — LOKELMA 5 G PO PACK: 10.0000 g | PACK | Freq: Every day | ORAL | 5 refills | Status: DC

## 2021-05-29 MED ORDER — CARVEDILOL 6.25 MG PO TABS: 6.2500 mg | ORAL_TABLET | Freq: Two times a day (BID) | ORAL | 11 refills | Status: DC

## 2021-05-29 MED ORDER — EPLERENONE 25 MG PO TABS: 25.0000 mg | ORAL_TABLET | Freq: Every day | ORAL | 11 refills | Status: DC

## 2021-05-29 NOTE — Progress Notes (Signed)
PCP: Tracie Harrier, MD Cardiology: Dr. Rockey Situ HF Cardiology: Dr. Aundra Dubin  56 y.o. with history of CAD s/p CABG, ischemic cardiomyopathy, and prior Hodgkins lymphoma was referred by Dr. Rockey Situ for CHF clinic evaluation.  Patient had Hodgkins disease diagnosed in 1994 and was treated with chemotherapy and radiation.  A strong family history of CAD and also the chest wall radiation with Hodgkins treatment likely predisposed him to premature CAD.  He had CABG in 2013. Echo at the time of CABG showed EF 30%.  EF has been low since that time except for cMRI done in 2/19 which showed EF 43%.  Most recent echo in 2/23 showed EF 20-25%, normal RV.  He had worsening dyspnea as well as chest pain in 1/23 and 2/23.  Cath was done, showing 90% proximal-mid RCA stenosis with atretic RIMA-PDA.  This was treated with DES x 2.  RHC showed prominent RV failure.    Since PCI, he has been doing somewhat better symptomatically.  No further chest pain.  He is not short of breath walking on flat ground. He is short of breath with stairs and with lifting/carrying heavy objects. No orthopnea/PND.  No lightheadedness.    Labs (2/23): LDL 119, TGs 291, K 5.3 (stopped KCl), creatinine 1.18  ECG (personally reviewed): NSR, LBBB 162 msec  PMH: 1. Hodgkins disease: Treated with chemotherapy and chest radiation in 1994.  2. CAD: CABG x 3 in 2013 with LIMA-LAD, RIMA-RCA, free radial-OM2.  - LHC (2/23): 90% proximal-mid RCA stenosis with atretic RIMA-RCA, patient had DES x 2 to proximal and mid RCA.  3. H/o COVID 4. Hyperlipidemia 5. Gout 6. Chronic systolic CHF: Ischemic cardiomyopathy.   - Echo (10/13): EF 30% - Echo (2018): EF 20-25%, moderate RV dysfunction.  - Cardiac MRI (2/19): EF 43%, minimal coronary pattern LGE.   - Echo (4/21): EF 20-25%, normal RV - Echo (2/23): EF 20-25%, normal RV - RHC (2/23): mean RA 18, PA 33/18 mean 23, PAPI < 1, CI 2.0 Fick/2.5 thermo.   Social History   Socioeconomic History    Marital status: Married    Spouse name: Not on file   Number of children: Not on file   Years of education: Not on file   Highest education level: Not on file  Occupational History   Not on file  Tobacco Use   Smoking status: Former    Packs/day: 0.50    Types: Cigarettes    Quit date: 10/21/1996    Years since quitting: 24.6   Smokeless tobacco: Former    Types: Snuff    Quit date: 04/05/2019  Vaping Use   Vaping Use: Never used  Substance and Sexual Activity   Alcohol use: No   Drug use: No   Sexual activity: Yes  Other Topics Concern   Not on file  Social History Narrative   Not on file   Social Determinants of Health   Financial Resource Strain: Low Risk    Difficulty of Paying Living Expenses: Not hard at all  Food Insecurity: No Food Insecurity   Worried About Charity fundraiser in the Last Year: Never true   [place] in the Last Year: Never true  Transportation Needs: No Transportation Needs   Lack of Transportation (Medical): No   Lack of Transportation (Non-Medical): No  Physical Activity: Not on file  Stress: Not on file  Social Connections: Not on file  Intimate Partner Violence: Not on file   Family History  Problem Relation  Age of Onset   Coronary artery disease Other    Heart disease Mother    Heart attack Father    ROS: All systems reviewed and negative except as per HPI.   Current Outpatient Medications  Medication Sig Dispense Refill   aspirin EC 81 MG tablet Take 81 mg by mouth every evening.     clopidogrel (PLAVIX) 75 MG tablet Take 1 tablet (75 mg total) by mouth daily with breakfast. 90 tablet 3   colchicine 0.6 MG tablet Take 0.6 mg by mouth daily as needed (GOUT FLARES).     dapagliflozin propanediol (FARXIGA) 10 MG TABS tablet Take 1 tablet (10 mg total) by mouth daily with breakfast. 90 tablet 3   Evolocumab (REPATHA SURECLICK) 626 MG/ML SOAJ Inject 1 pen. into the skin every 14 (fourteen) days. 6 mL 3   ezetimibe (ZETIA) 10 MG  tablet Take 1 tablet (10 mg total) by mouth daily. 90 tablet 3   furosemide (LASIX) 40 MG tablet Take 0.5 tablets (20 mg total) by mouth daily as needed for fluid. 30 tablet 3   icosapent Ethyl (VASCEPA) 1 g capsule Take 2 capsules (2 g total) by mouth 2 (two) times daily. 120 capsule 11   pantoprazole (PROTONIX) 40 MG tablet Take 40 mg by mouth in the morning.     rosuvastatin (CRESTOR) 40 MG tablet Take 1 tablet (40 mg total) by mouth daily. 90 tablet 3   sacubitril-valsartan (ENTRESTO) 24-26 MG Take 1 tablet by mouth 2 (two) times daily. 180 tablet 3   senna (SENOKOT) 8.6 MG tablet Take 4 tablets by mouth at bedtime.     sodium zirconium cyclosilicate (LOKELMA) 5 g packet Take 10 g by mouth daily. 30 packet 5   vitamin B-12 (CYANOCOBALAMIN) 1000 MCG tablet Take 1 tablet (1,000 mcg total) by mouth daily. 30 tablet 1   carvedilol (COREG) 6.25 MG tablet Take 1 tablet (6.25 mg total) by mouth 2 (two) times daily. 60 tablet 11   eplerenone (INSPRA) 25 MG tablet Take 1 tablet (25 mg total) by mouth at bedtime. 30 tablet 11   No current facility-administered medications for this encounter.   BP 122/86   Pulse 63   Wt 97.3 kg (214 lb 6.4 oz)   SpO2 100%   BMI 30.76 kg/m  General: NAD Neck: No JVD, no thyromegaly or thyroid nodule.  Lungs: Clear to auscultation bilaterally with normal respiratory effort. CV: Nondisplaced PMI.  Heart regular S1/S2, no S3/S4, no murmur.  No peripheral edema.  No carotid bruit.  Normal pedal pulses.  Abdomen: Soft, nontender, no hepatosplenomegaly, no distention.  Skin: Intact without lesions or rashes.  Neurologic: Alert and oriented x 3.  Psych: Normal affect. Extremities: No clubbing or cyanosis.  HEENT: Normal.   Assessment/Plan: 1. CAD: s/p CABG in 2013 and DES x 2 to native RCA in 2/23 with atretic RIMA-PDA noted. No further chest pain since PCI.  - Continue ASA 81 and Plavix 75.  - Continue Crestor and Zetia. - To do cardiac rehab at [facility].  2.  Hyperlipidemia: Lipids in 2/23 showed LDL 119 and TGs 291.  - Continue Crestor and Zetia, but want to see LDL < 55 ideally.  I will refer to lipid clinic for [place].  - With high triglycerides, add Vascepa 2 g bid.  3. Chronic systolic CHF: Ischemic cardiomyopathy. He has had persistently low EF, most recent echo in 2/23 with EF 20-25%, normal RV.  [place] in 2/23 showed prominent RV failure with PAPI <  1.  He is not volume overloaded on exam, NYHA class II symptoms.  Medication titration has been limited by orthostasis.  - Breast pain with spironolactone, will start eplerenone 25 mg daily.  He had mildly elevated K earlier this month but K supplement was stopped. BMET/BNP today and repeat in 10 days.  - Continue Coreg 3.125 mg bid.  - Continue Entresto 24/26 bid.  - Continue Farxiga 10 mg daily.  - Repeat echo in 5/23.  He has a wide LBBB (162 msec today).  If EF remains low despite recent PCI, he will qualify for CRT-D device.   Followup with HF pharmacist in 3 wks for 2 visits for medication adjustment.   Followup with me with echo in 5/23.   Loralie Champagne 05/29/2021

## 2021-05-29 NOTE — Progress Notes (Signed)
Echocardiogram 2D Echocardiogram has been performed.  Willie [name] M 05/29/2021, 8:56 AM

## 2021-05-29 NOTE — Patient Instructions (Addendum)
Increase Eplerenone to 25 mg daily. Don't not increase this dose till you start your Lokelma   Increase Carvedilol to 6.25 mg Twice daily  Start Lokelma 5g daily  Blood work in a week.  Please follow up with our heart failure pharmacist in 3 weeks.  Your physician recommends that you schedule a follow-up appointment as scheduled   If you have any questions or concerns before your next appointment please send [place] a message through [place] or call our office at [phone].    TO LEAVE A MESSAGE FOR THE NURSE SELECT OPTION 2, PLEASE LEAVE A MESSAGE INCLUDING: YOUR NAME DATE OF BIRTH CALL BACK NUMBER REASON FOR CALL**this is important as we prioritize the call backs  YOU WILL RECEIVE A CALL BACK THE SAME DAY AS LONG AS YOU CALL BEFORE 4:00 PM  At the [facility], you and your health needs are our priority. As part of our continuing mission to provide you with exceptional heart care, we have created designated Provider Care Teams. These Care Teams include your primary Cardiologist (physician) and Advanced Practice Providers (APPs- Physician Assistants and Nurse Practitioners) who all work together to provide you with the care you need, when you need it.   You may see any of the following providers on your designated Care Team at your next follow up: Dr Glori Bickers Dr Haynes Kerns, NP Lyda Jester, [place] [facility] [place], [place] Audry Riles, PharmD   Please be sure to bring in all your medications bottles to every appointment.

## 2021-05-31 ENCOUNTER — Encounter (HOSPITAL_COMMUNITY): Payer: Self-pay | Admitting: Cardiology

## 2021-06-06 ENCOUNTER — Other Ambulatory Visit
Admission: RE | Admit: 2021-06-06 | Discharge: 2021-06-06 | Disposition: A | Discharge status: Home / Self Care | Payer: Managed Care, Other (non HMO) | Attending: Cardiology | Admitting: Cardiology

## 2021-06-06 DIAGNOSIS — I5022 Chronic systolic (congestive) heart failure: Secondary | ICD-10-CM | POA: Insufficient documentation

## 2021-06-06 LAB — BASIC METABOLIC PANEL
Anion gap: 5 (ref 5–15)
BUN: 14 mg/dL (ref 6–20)
CO2: 26 mmol/L (ref 22–32)
Calcium: 9.5 mg/dL (ref 8.9–10.3)
Chloride: 106 mmol/L (ref 98–111)
Creatinine, Ser: 1.04 mg/dL (ref 0.61–1.24)
GFR, Estimated: >60 mL/min (ref >60)
Glucose, Bld: 106 mg/dL — ABNORMAL HIGH (ref 70–99)
Potassium: 4.3 mmol/L (ref 3.5–5.1)
Sodium: 137 mmol/L (ref 135–145)

## 2021-06-07 ENCOUNTER — Encounter: Payer: Self-pay | Admitting: Pharmacist

## 2021-06-07 NOTE — Progress Notes (Signed)
Advanced Heart Failure Clinic Note   PCP: Tracie Harrier, MD Cardiology: Dr. Rockey Situ HF Cardiology: Dr. Aundra Dubin  HPI:  56 y.o. with history of CAD s/p CABG, ischemic cardiomyopathy, and prior Hodgkins lymphoma was referred by Dr. Rockey Situ for CHF clinic evaluation.  Patient had Hodgkins disease diagnosed in 1994 and was treated with chemotherapy and radiation.  He has a strong family history of CAD and plus the chest wall radiation for Hodgkins treatment likely predisposed him to premature CAD.  He had CABG in 2013. Echo at the time of CABG showed EF 30%. EF has been low since that time except for cMRI done in 02/2017 which showed EF 43%.  Echo in 02/2021 showed EF 20-25%, normal RV.  He had worsening dyspnea as well as chest pain in 01/2021 and 02/2021.  Cath was done, showing 90% proximal-mid RCA stenosis with atretic RIMA-PDA.  This was treated with DES x 2.  RHC showed prominent RV failure.    Echo was done 05/29/21, EF 25-30% with septal-lateral dyssynchrony and mild RV dysfunction.    He was last seen in the AHF Clinic on 05/29/21. He reported that he still gets fatigued/dyspneic walking to his mailbox (has to go up a small hill).  He noted fatigue with stairs.  Noted he generally does ok on flat ground.  He had finished cardiac rehab. No orthopnea/PND.  No chest pain.  Weight was down 7 lbs.    Today he returns to HF clinic for pharmacist medication titration. At last visit with MD, carvedilol was increased to 6.25 mg BID. Additionally, Lokelma 10 g daily was initiated and eplerenone was increased to 25 mg daily. He was also referred to EP for consideration of CRT-D. Overall he is feeling ok today. Main complaint continues to be fatigue. States he is tired all the time. He will be active for half a day but then has to take breaks the rest of the day. Feels dizzy first thing in the morning or when laying down. These episodes only last a few seconds. Has occasional chest pain that resolves with rest.  No palpitations. Gets SOB with mild activity, can only walk to mailbox and back (~25 yards) before getting SOB. Weight at home has been stable at 210 lbs. Has not needed any PRN Lasix. No LEE, PND or orthopnea. States he snores.   HF Medications: Carvedilol 6.25 mg BID Entresto 24/26 mg BID Eplerenone 25 mg daily Farxiga 10 mg daily Furosemide 20 mg PRN Lokelma 10 g daily  Has the patient been experiencing any side effects to the medications prescribed? No  Does the patient have any problems obtaining medications due to transportation or finances?  [employer], No issues affording medications.   Understanding of regimen: poor Understanding of indications: fair Potential of compliance: good Patient understands to avoid NSAIDs. Patient understands to avoid decongestants.    Pertinent Lab Values: 06/06/21: Serum creatinine 1.04, BUN 14, Potassium 4.3, Sodium 137    Vital Signs: Weight: 216 lbs (last clinic weight: 214 lbs) Blood pressure: 140/80  Heart rate: 83   Assessment/Plan: 1.  CAD: s/p CABG in 2013 and DES x 2 to native RCA in 02/2021 with atretic RIMA-PDA noted. No further chest pain since PCI.  - Continue aspirin 81 mg and Plavix 75 mg daily.  - Continue rosuvastatin 40 mg daily and Zetia. - cardiac rehab at [facility]  2. Hyperlipidemia: He is now on Repatha, rosuvastatin, Vascepa, and Zetia. Good lipids in 05/2021.   3. Chronic systolic CHF:  Ischemic cardiomyopathy.  Echo in 02/2021 with EF 20-25%, normal RV.  [place] in 02/2021 showed prominent RV failure with PAPI < 1.  Echo 05/2021 showed EF 25-30% with septal-lateral dyssynchrony and mild RV dysfunction   - He is not volume overloaded on exam, NYHA class II symptoms.  - Continue furosemide 20 mg PRN  - Continue carvedilol 3.125 mg BID. - Increase Entresto to 49/51 mg BID. - Continue eplerenone 25 mg daily. Continue taking Lokelma 10 g daily. - Continue Farxiga 10 mg daily.  - Persistently low EF with septal-lateral  dyssynchrony. 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 Referred to EP for CRT-D.  -Refer for home sleep study  Follow up 4 weeks with [place], PharmD, BCPS, [place], [facility] Pharmacist [phone]

## 2021-06-19 ENCOUNTER — Ambulatory Visit (HOSPITAL_COMMUNITY)
Admission: RE | Admit: 2021-06-19 | Discharge: 2021-06-19 | Disposition: A | Discharge status: Home / Self Care | Payer: Managed Care, Other (non HMO) | Source: Ambulatory Visit | Attending: Internal Medicine | Admitting: Internal Medicine

## 2021-06-19 ENCOUNTER — Other Ambulatory Visit (HOSPITAL_COMMUNITY): Payer: Self-pay | Admitting: Surgery

## 2021-06-19 VITALS — BP 140/80 | HR 83 | Wt 216.0 lb

## 2021-06-19 DIAGNOSIS — Z7982 Long term (current) use of aspirin: Secondary | ICD-10-CM | POA: Diagnosis not present

## 2021-06-19 DIAGNOSIS — I255 Ischemic cardiomyopathy: Secondary | ICD-10-CM | POA: Diagnosis not present

## 2021-06-19 DIAGNOSIS — I5022 Chronic systolic (congestive) heart failure: Secondary | ICD-10-CM | POA: Diagnosis present

## 2021-06-19 DIAGNOSIS — Z9221 Personal history of antineoplastic chemotherapy: Secondary | ICD-10-CM | POA: Insufficient documentation

## 2021-06-19 DIAGNOSIS — I447 Left bundle-branch block, unspecified: Secondary | ICD-10-CM | POA: Insufficient documentation

## 2021-06-19 DIAGNOSIS — Z79899 Other long term (current) drug therapy: Secondary | ICD-10-CM | POA: Insufficient documentation

## 2021-06-19 DIAGNOSIS — Z951 Presence of aortocoronary bypass graft: Secondary | ICD-10-CM | POA: Diagnosis not present

## 2021-06-19 DIAGNOSIS — Z8571 Personal history of Hodgkin lymphoma: Secondary | ICD-10-CM | POA: Diagnosis not present

## 2021-06-19 DIAGNOSIS — Z8249 Family history of ischemic heart disease and other diseases of the circulatory system: Secondary | ICD-10-CM | POA: Insufficient documentation

## 2021-06-19 DIAGNOSIS — R0683 Snoring: Secondary | ICD-10-CM

## 2021-06-19 DIAGNOSIS — E785 Hyperlipidemia, unspecified: Secondary | ICD-10-CM | POA: Diagnosis not present

## 2021-06-19 DIAGNOSIS — I2510 Atherosclerotic heart disease of native coronary artery without angina pectoris: Secondary | ICD-10-CM | POA: Insufficient documentation

## 2021-06-19 MED ORDER — LOKELMA 10 G PO PACK: 10.0000 g | PACK | Freq: Every day | ORAL | 3 refills | Status: DC

## 2021-06-19 MED ORDER — SACUBITRIL-VALSARTAN 49-51 MG PO TABS: 1.0000 | ORAL_TABLET | Freq: Two times a day (BID) | ORAL | 3 refills | Status: DC

## 2021-06-19 NOTE — Progress Notes (Signed)
Patient given home sleep study device while in clinic.  I reviewed instructions to complete and informed him that I would call when need for insurance prior [name] is confirmed.  He is aware and agreeable.  STOP BANG RISK ASSESSMENT S (snore) Have you been told that you snore?     YES   T (tired) Are you often tired, fatigued, or sleepy during the day?   YES  O (obstruction) Do you stop breathing, choke, or gasp during sleep? YES   P (pressure) Do you have or are you being treated for high blood pressure? YES   B (BMI) Is your body index greater than 35 kg/m? NO   A (age) Are you 58 years old or older? YES   N (neck) Do you have a neck circumference greater than 16 inches?   NO   G (gender) Are you a male? YES   TOTAL STOP/BANG "YES" ANSWERS 6

## 2021-06-19 NOTE — Patient Instructions (Signed)
It was a pleasure seeing you today! ° °MEDICATIONS: °-We are changing your medications today °-Increase Entresto to 49/51 mg (1 tablet) twice daily. You may take 2 tablets of the 24/26 mg strength twice daily until you receive the new strength.  °-Call if you have questions about your medications. ° ° °NEXT APPOINTMENT: °Return to clinic in 4 weeks with Pharmacy Clinic.  ° °In general, to take care of your heart failure: °-Limit your fluid intake to 2 Liters (half-gallon) per day.   °-Limit your salt intake to ideally 2-3 grams (2000-3000 mg) per day. °-Weigh yourself daily and record, and bring that "weight diary" to your next appointment.  (Weight gain of 2-3 pounds in 1 day typically means fluid weight.) °-The medications for your heart are to help your heart and help you live longer.   °-Please contact us before stopping any of your heart medications. ° °Call the clinic at [phone] with questions or to reschedule future appointments.  °

## 2021-06-26 ENCOUNTER — Encounter (INDEPENDENT_AMBULATORY_CARE_PROVIDER_SITE_OTHER): Payer: Managed Care, Other (non HMO) | Admitting: Cardiology

## 2021-06-26 ENCOUNTER — Telehealth (HOSPITAL_COMMUNITY): Payer: Self-pay | Admitting: Surgery

## 2021-06-26 DIAGNOSIS — G4733 Obstructive sleep apnea (adult) (pediatric): Secondary | ICD-10-CM

## 2021-06-26 NOTE — Telephone Encounter (Signed)
I called patient to let him know that he can proceed to complete his ordered home sleep study and that insurance prior [name] is not needed. I left a message and asked him to complete the study within the next several nights.

## 2021-06-27 ENCOUNTER — Encounter: Payer: Self-pay | Admitting: Pharmacist

## 2021-06-27 NOTE — Procedures (Signed)
   SLEEP STUDY REPORT Patient Information Study Date: 06/26/21 Patient Name: Willie [name] Patient ID: [id number] Birth Date: 26-Jul-2065 Age: 56 Gender: Male Referring Physician: Loralie Champagne, MD  TEST DESCRIPTION: Home sleep apnea testing was completed using the WatchPat, a Type 1 device, utilizing  peripheral arterial tonometry (PAT), chest movement, actigraphy, pulse oximetry, pulse rate, body position and snore.  AHI was calculated with apnea and hypopnea using valid sleep time as the denominator. RDI includes apneas,  hypopneas, and RERAs. The data acquired and the scoring of sleep and all associated events were performed in  accordance with the recommended standards and specifications as outlined in the AASM Manual for the Scoring of  Sleep and Associated Events 2.2.0 (2015).  FINDINGS: 1. Moderate Obstructive Sleep Apnea with AHI 21.2/hr.  2. No significant Central Sleep Apnea with pAHIc 6.2/hr. 3. Oxygen desaturations as low as 86%. 4. Mild snoring was present. O2 sats were < 88% for 0 min. 5. Total sleep time was 6 hrs and 52mn. 6. 41.9% of total sleep time was spent in REM sleep.  7. Normal sleep onset latency at 17 min 8. Shortened REM sleep onset latency at 53 min.  9. Total awakenings were 13.   DIAGNOSIS:  Moderate Obstructive Sleep Apnea (G47.33)  RECOMMENDATIONS: 1. Clinical correlation of these findings is necessary. The decision to treat obstructive sleep apnea (OSA) is usually  based on the presence of apnea symptoms or the presence of associated medical conditions such as Hypertension,  Congestive Heart Failure, Atrial Fibrillation or Obesity. The most common symptoms of OSA are snoring, gasping for  breath while sleeping, daytime sleepiness and fatigue.   2. Initiating apnea therapy is recommended given the presence of symptoms and/or associated conditions.  Recommend proceeding with one of the following:   a. Auto-CPAP therapy with a pressure range of  5-20cm H2O.   b. An oral appliance (OA) that can be obtained from certain dentists with expertise in sleep medicine. These are  primarily of use in non-obese patients with mild and moderate disease.   c. An ENT consultation which may be useful to look for specific causes of obstruction and possible treatment  options.   d. If patient is intolerant to PAP therapy, consider referral to ENT for evaluation for hypoglossal nerve stimulator.   3. Close follow-up is necessary to ensure success with CPAP or oral appliance therapy for maximum benefit .  4. A follow-up oximetry study on CPAP is recommended to assess the adequacy of therapy and determine the need  for supplemental oxygen or the potential need for Bi-level therapy. An arterial blood gas to determine the adequacy of  baseline ventilation and oxygenation should also be considered.  5. Healthy sleep recommendations include: adequate nightly sleep (normal 7-9 hrs/night), avoidance of caffeine after  noon and alcohol near bedtime, and maintaining a sleep environment that is cool, dark and quiet.  6. Weight loss for overweight patients is recommended. Even modest amounts of weight loss can significantly  improve the severity of sleep apnea.  7. Snoring recommendations include: weight loss where appropriate, side sleeping, and avoidance of alcohol before  bed.  8. Operation of motor vehicle should not be performed when sleepy.  Signature: Electronically Signed: 06/27/21 TFransico Him MD; [facility] DShell[facility] American Board of  Sleep Medicine

## 2021-06-28 ENCOUNTER — Ambulatory Visit: Payer: Managed Care, Other (non HMO)

## 2021-06-28 DIAGNOSIS — R0683 Snoring: Secondary | ICD-10-CM

## 2021-07-01 ENCOUNTER — Telehealth (HOSPITAL_COMMUNITY): Payer: Self-pay | Admitting: Surgery

## 2021-07-03 ENCOUNTER — Encounter: Payer: Self-pay | Admitting: Internal Medicine

## 2021-07-03 ENCOUNTER — Encounter: Payer: Self-pay | Admitting: *Deleted

## 2021-07-03 ENCOUNTER — Telehealth: Payer: Self-pay | Admitting: *Deleted

## 2021-07-03 ENCOUNTER — Ambulatory Visit (INDEPENDENT_AMBULATORY_CARE_PROVIDER_SITE_OTHER): Payer: Managed Care, Other (non HMO) | Admitting: Internal Medicine

## 2021-07-03 VITALS — BP 106/74 | HR 69 | Ht 70.0 in | Wt 215.6 lb

## 2021-07-03 DIAGNOSIS — Z01812 Encounter for preprocedural laboratory examination: Secondary | ICD-10-CM

## 2021-07-03 DIAGNOSIS — I5022 Chronic systolic (congestive) heart failure: Secondary | ICD-10-CM

## 2021-07-03 DIAGNOSIS — G4733 Obstructive sleep apnea (adult) (pediatric): Secondary | ICD-10-CM

## 2021-07-03 DIAGNOSIS — I255 Ischemic cardiomyopathy: Secondary | ICD-10-CM

## 2021-07-03 LAB — BASIC METABOLIC PANEL
BUN/Creatinine Ratio: 12 (ref 9–20)
BUN: 15 mg/dL (ref 6–24)
CO2: 27 mmol/L (ref 20–29)
Calcium: 10.1 mg/dL (ref 8.7–10.2)
Chloride: 102 mmol/L (ref 96–106)
Creatinine, Ser: 1.25 mg/dL (ref 0.76–1.27)
Glucose: 73 mg/dL (ref 70–99)
Potassium: 4.4 mmol/L (ref 3.5–5.2)
Sodium: 138 mmol/L (ref 134–144)
eGFR: 67 mL/min/{1.73_m2} (ref >59)

## 2021-07-03 LAB — CBC
Hematocrit: 47.5 % (ref 37.5–51.0)
Hemoglobin: 16.3 g/dL (ref 13.0–17.7)
MCH: 30 pg (ref 26.6–33.0)
MCHC: 34.3 g/dL (ref 31.5–35.7)
MCV: 88 fL (ref 79–97)
Platelets: 202 10*3/uL (ref 150–450)
RBC: 5.43 x10E6/uL (ref 4.14–5.80)
RDW: 14.2 % (ref 11.6–15.4)
WBC: 7.6 10*3/uL (ref 3.4–10.8)

## 2021-07-03 NOTE — Telephone Encounter (Signed)
-----   Message from Lauralee Evener, [place] sent at 06/28/2021  8:40 AM EDT -----  ----- Message ----- From: Sueanne Margarita, MD Sent: 06/27/2021   1:12 PM EDT To: [facility] Sleep Studies  Please let patient know that they have sleep apnea and recommend treating with CPAP.  Please order an auto CPAP from 4-15cm H2O with heated humidity and mask of choice.  Order overnight pulse ox on CPAP.  Followup with me in 6 weeks.

## 2021-07-03 NOTE — Progress Notes (Addendum)
    ELECTROPHYSIOLOGY CONSULT NOTE  Patient ID: Willie [name], MRN: [id number], DOB/AGE: 08/05/1965 56 y.o. Admit date: (Not on file) Date of Consult: 07/03/2021  Primary Physician: Hande, Vishwanath, MD Primary Cardiologist: DM TG     Willie [name] is a 56 y.o. male who is being seen today for the evaluation of CRT-D at the request of DM.    HPI Willie [name] is a 56 y.o. 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  He also underwent bypass surgery in 2013 EF at that time was about 30%   I saw in consultation 2019 for consideration of an ICD; his ejection fraction at that time was about 45% and proceeding was deferred.  Intercurrently, developed worsening shortness of breath as well as chest pain.  Catheterization 1/23 demonstrated RCA disease and an atretic RIMA he underwent DES x2 right heart catheterization showed severely elevated right-sided filling pressures.  There was question of equalization   Denies nocturnal dyspnea abdominal distention or peripheral edema.  Dyspnea on exertion and fatigue.  No chest pain.  DATE TEST EF   2/19 cMRI 43%   2/23 Echo   20-25 %   1/23 LHC  RCA stent  5/23 Echo  25-30% RV dysfunction-mild   Date Cr K Hgb  6/23 1 4.3 16.5 (2/23)            Past Medical History:  Diagnosis Date   Coronary artery disease    a. 10/2011 Cath: LM 85, LAD 20p, D1 20, LCX 20/50m, OM1 85, RCA 60ost, 20m, 50d; b. 10/2011 CABG x 3: LIMA->LAD, RIMA->dRCA, L Radial->OM2; c. 08/2015 MV: EF 33%, apical ant, apical septal, apical infarct, no ischemia.   Family history of coronary artery disease 10/23/2011   HFrEF (heart failure with reduced ejection fraction) (HCC)    a. 02/2017 cMRI: EF 43%, mod MR, minimal LGE, nl RV size/fxn; b. 04/2019 Echo: EF 25-30%, glob HK, gr2 DD, mod reduced RV fxn, triv MR/MR, Ao root 39mm.   Hodgkin's lymphoma (HCC) 1994   neck/midchest area  with radiation   Hyperlipidemia 10/23/2011   Hypertension    Ischemic cardiomyopathy    a. 08/2016 Echo: EF 20-25%; b. 02/2017 cMRI: EF 43% (no indication for ICD based on this finding); c. 04/2019 Echo: EF 25-30%.   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   LIMA to LAD, RIMA to RCA, LRA to OM2   S/P radiation therapy 10/21/1992   Radiation therapy to chest and neck for Hodgkin's lymphoma   Unstable angina (HCC) 10/22/2011      Surgical History:  Past Surgical History:  Procedure Laterality Date   CARDIAC CATHETERIZATION     CARPAL TUNNEL RELEASE     CORONARY ARTERY BYPASS GRAFT  10/24/2011   Procedure: CORONARY ARTERY BYPASS GRAFTING (CABG);  Surgeon: Clarence H Owen, MD;  Location: [facility] OR;  Service: Open Heart Surgery;  Laterality: N/A;  Coronary Artery Bypass Grafting X 3 Using Left Internal  Mammary Artery, Right Internal Mammary Artery and Right Radial Artery   CORONARY STENT INTERVENTION N/A 02/15/2021   Procedure: CORONARY STENT INTERVENTION;  Surgeon: End, Christopher, MD;  Location: [facility] INVASIVE CV LAB;  Service: Cardiovascular;  Laterality: N/A;   LYMPH NODE BIOPSY  1994   left neck   RADIAL ARTERY HARVEST  10/24/2011   Procedure: RADIAL ARTERY HARVEST;  Surgeon: Clarence H Owen, MD;  Location: [facility]   OR;  Service: Vascular;  Laterality: Left;   RIGHT/LEFT HEART CATH AND CORONARY/GRAFT ANGIOGRAPHY N/A 02/15/2021   Procedure: RIGHT/LEFT HEART CATH AND CORONARY/GRAFT ANGIOGRAPHY;  Surgeon: End, Christopher, MD;  Location: [facility] INVASIVE CV LAB;  Service: Cardiovascular;  Laterality: N/A;     Home Meds: Current Meds  Medication Sig   aspirin EC 81 MG tablet Take 81 mg by mouth every evening.   carvedilol (COREG) 6.25 MG tablet Take 1 tablet (6.25 mg total) by mouth 2 (two) times daily.   clopidogrel (PLAVIX) 75 MG tablet Take 1 tablet (75 mg total) by mouth daily with breakfast.   colchicine 0.6 MG tablet Take 0.6 mg by mouth daily as needed (GOUT  FLARES).   dapagliflozin propanediol (FARXIGA) 10 MG TABS tablet Take 1 tablet (10 mg total) by mouth daily with breakfast.   eplerenone (INSPRA) 25 MG tablet Take 1 tablet (25 mg total) by mouth at bedtime.   Evolocumab (REPATHA SURECLICK) 140 MG/ML SOAJ Inject 1 pen. into the skin every 14 (fourteen) days.   ezetimibe (ZETIA) 10 MG tablet Take 1 tablet (10 mg total) by mouth daily.   furosemide (LASIX) 40 MG tablet Take 0.5 tablets (20 mg total) by mouth daily as needed for fluid.   icosapent Ethyl (VASCEPA) 1 g capsule Take 2 capsules (2 g total) by mouth 2 (two) times daily.   pantoprazole (PROTONIX) 40 MG tablet Take 40 mg by mouth in the morning.   rosuvastatin (CRESTOR) 40 MG tablet Take 1 tablet (40 mg total) by mouth daily.   sacubitril-valsartan (ENTRESTO) 49-51 MG Take 1 tablet by mouth 2 (two) times daily.   senna (SENOKOT) 8.6 MG tablet Take 4 tablets by mouth at bedtime.   sodium zirconium cyclosilicate (LOKELMA) 10 g PACK packet Take 10 g by mouth daily.   sodium zirconium cyclosilicate (LOKELMA) 5 g packet Take 10 g by mouth daily.   vitamin B-12 (CYANOCOBALAMIN) 1000 MCG tablet Take 1 tablet (1,000 mcg total) by mouth daily.    Allergies:  Allergies  Allergen Reactions   Spironolactone Other (See Comments)    Caused Chest swelling/knots around breast area.      Social History   Socioeconomic History   Marital status: Married    Spouse name: Not on file   Number of children: Not on file   Years of education: Not on file   Highest education level: Not on file  Occupational History   Not on file  Tobacco Use   Smoking status: Former    Packs/day: 0.50    Types: Cigarettes    Quit date: 10/21/1996    Years since quitting: 24.7   Smokeless tobacco: Former    Types: Snuff    Quit date: 04/05/2019  Vaping Use   Vaping Use: Never used  Substance and Sexual Activity   Alcohol use: No   Drug use: No   Sexual activity: Yes  Other Topics Concern   Not on file   Social History Narrative   Not on file   Social Determinants of Health   Financial Resource Strain: Low Risk  (04/18/2021)   Overall Financial Resource Strain (CARDIA)    Difficulty of Paying Living Expenses: Not hard at all  Food Insecurity: No Food Insecurity (04/18/2021)   Hunger Vital Sign    Worried About Running Out of Food in the Last Year: Never true    Ran Out of Food in the Last Year: Never true  Transportation Needs: No Transportation Needs (04/18/2021)   PRAPARE -   Transportation    Lack of Transportation (Medical): No    Lack of Transportation (Non-Medical): No  Physical Activity: Not on file  Stress: Not on file  Social Connections: Not on file  Intimate Partner Violence: Not on file     Family History  Problem Relation Age of Onset   Coronary artery disease Other    Heart disease Mother    Heart attack Father      ROS:  Please see the history of present illness.     All other systems reviewed and negative.    Physical Exam: Blood pressure 106/74, pulse 69, height 5' 10" (1.778 m), weight 215 lb 9.6 oz (97.8 kg), SpO2 97 %. General: Well developed, well nourished male in no acute distress. Head: Normocephalic, atraumatic, sclera non-icteric, no xanthomas, nares are without discharge. EENT: normal  Lymph Nodes:  none Neck: Negative for carotid bruits. JVD not elevated. Back:without scoliosis kyphosis Lungs: Clear bilaterally to auscultation without wheezes, rales, or rhonchi. Breathing is unlabored. Heart: RRR with S1 S2. No murmur . No rubs, or gallops appreciated. Abdomen: Soft, non-tender, non-distended with normoactive bowel sounds. No hepatomegaly. No rebound/guarding. No obvious abdominal masses. Msk:  Strength and tone appear normal for age. Extremities: No clubbing or cyanosis. No  edema.  Distal pedal pulses are 2+ and equal bilaterally. Skin: Warm and Dry Neuro: Alert and oriented X 3. CN III-XII intact Grossly normal sensory and motor function  . Psych:  Responds to questions appropriately with a normal affect.        EKG: Sinus rhythm at 69 20/17/45 Left bundle branch block with a monophasic fractionated QRS lead V6 triphasic QRS lead I    Assessment and Plan:  Nonischemic and ischemic cardiomyopathy CABG and recent interval stenting  History of Hodgkin's disease with mantle radiation and chemotherapy  Congestive heart failure left greater than right class III-B  Left bundle branch block  Lightheadedness  Fatigue    The patient has persistent left ventricular dysfunction despite revascularization in the setting of ischemic and nonischemic cardiomyopathy and left bundle branch block.  He is appropriately considered for ICD implantation for primary prevention; he is also however candidate for resynchronization based on his QRS duration and his symptoms.  The contributing cause of his cardiomyopathy portends some variability in the likelihood of significant improvement or probably in the range of 65- 75 80%  Continue i Entresto 49/51 but I am not sanguine that he will be able to tolerate it going forward   He is having significant lightheadedness.  his eplerenone had been decreased to 12.5 and then was uptitrated by pharmacy.  His carvedilol was also recently uptitrated; I wonder whether it may be contributing to his fatigue.  Rather than make any medication adjustments myself, I will reach out to the heart failure clinic I would asked him to consider 2 adjustments-1, bisoprolol as an alternative to carvedilol to see whether any of the fatigue is related to that drug, 2-to change his eplerenone back to 12.5 so as to try to mitigate lightheadedness.  Dr DM has corrected me on this that the lowest dose for eplerenone is 25       Willie [name]  

## 2021-07-03 NOTE — Patient Instructions (Addendum)
Medication Instructions:  Your physician recommends that you continue on your current medications as directed. Please refer to the Current Medication list given to you today.  *If you need a refill on your cardiac medications before your next appointment, please call your pharmacy*   Lab Work:  CBC and BMET  If you have labs (blood work) drawn today and your tests are completely normal, you will receive your results only by: [place] (if you have MyChart) OR A paper copy in the mail If you have any lab test that is abnormal or we need to change your treatment, we will call you to review the results.   Testing/Procedures: Your physician has recommended that you have a defibrillator inserted. An implantable cardioverter defibrillator (ICD) is a small device that is placed in your chest or, in rare cases, your abdomen. This device uses electrical pulses or shocks to help control life-threatening, irregular heartbeats that could lead the heart to suddenly stop beating (sudden cardiac arrest). Leads are attached to the ICD that goes into your heart. This is done in the hospital and usually requires an overnight stay. Please see the instruction sheet given to you today for more information.   CRT or cardiac resynchronization therapy is a treatment used to correct heart failure. When you have heart failure your heart is weakened and doesn't pump as well as it should. This therapy may help reduce symptoms and improve the quality of life.  Please see the handout/brochure given to you today to get more information of the different options of therapy.    Follow-Up: At [facility], you and your health needs are our priority.  As part of our continuing mission to provide you with exceptional heart care, we have created designated Provider Care Teams.  These Care Teams include your primary Cardiologist (physician) and Advanced Practice Providers (APPs -  Physician Assistants and Nurse  Practitioners) who all work together to provide you with the care you need, when you need it.  We recommend signing up for the patient portal called "MyChart".  Sign up information is provided on this After Visit Summary.  MyChart is used to connect with patients for Virtual Visits (Telemedicine).  Patients are able to view lab/test results, encounter notes, upcoming appointments, etc.  Non-urgent messages can be sent to your provider as well.   To learn more about what you can do with MyChart, go to [email or web address].    Your next appointment:   To be scheduled - Dr Olin Pia scheduler will call you to schedule`  Important Information About Sugar

## 2021-07-03 NOTE — Telephone Encounter (Signed)
The patient has been notified of the result and verbalized understanding.  All questions (if any) were answered. Marolyn Hammock, [place] 07/10/2021 5:07 PM    Upon patient request DME selection is [facility] Patient understands he will be contacted by [facility] to set up his cpap. Patient understands to call if [facility] does not contact him with new setup in a timely manner. Patient understands they will be called once confirmation has been received from [employer]/ that they have received their new machine to schedule 10 week follow up appointment.   Mora notified of new cpap order  Please add to airview Patient was grateful for the call and thanked me.  This encounter was created in error - please disregard.

## 2021-07-03 NOTE — Telephone Encounter (Addendum)
The patient has been notified of the result via his mychart. Left detailed message on voicemail and informed patient to call back.Marolyn Hammock, [place] 05/16/2021 12:08 PM    Marolyn Hammock, [place] 07/03/2021 3:00 PM

## 2021-07-03 NOTE — H&P (View-Only) (Signed)
ELECTROPHYSIOLOGY CONSULT NOTE  Patient ID: Willie [name], MRN: [id number], DOB/AGE: 07-25-1965 56 y.o. Admit date: (Not on file) Date of Consult: 07/03/2021  Primary Physician: Tracie Harrier, MD Primary Cardiologist: DM TG     Willie [name] is a 56 y.o. male who is being seen today for the evaluation of CRT-D at the request of DM.    HPI Willie [name] is a 56 y.o. 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  He also underwent bypass surgery in 2013 EF at that time was about 30%   I saw in consultation 2019 for consideration of an ICD; his ejection fraction at that time was about 45% and proceeding was deferred.  Intercurrently, developed worsening shortness of breath as well as chest pain.  Catheterization 1/23 demonstrated RCA disease and an atretic RIMA he underwent DES x2 right heart catheterization showed severely elevated right-sided filling pressures.  There was question of equalization   Denies nocturnal dyspnea abdominal distention or peripheral edema.  Dyspnea on exertion and fatigue.  No chest pain.  DATE TEST EF   2/19 cMRI 43%   2/23 Echo   20-25 %   1/23 LHC  RCA stent  5/23 Echo  25-30% RV dysfunction-mild   Date Cr K Hgb  6/23 1 4.3 16.5 (2/23)            Past Medical History:  Diagnosis Date   Coronary artery disease    a. 10/2011 Cath: LM 85, LAD 20p, D1 20, LCX 20/35m OM1 85, RCA 60ost, 273m50d; b. 10/2011 CABG x 3: LIMA->LAD, RIMA->dRCA, L Radial->OM2; c. 08/2015 MV: EF 33%, apical ant, apical septal, apical infarct, no ischemia.   Family history of coronary artery disease 10/23/2011   HFrEF (heart failure with reduced ejection fraction) ([facility]   a. 02/2017 cMRI: EF 43%, mod MR, minimal LGE, nl RV size/fxn; b. 04/2019 Echo: EF 25-30%, glob HK, gr2 DD, mod reduced RV fxn, triv MR/MR, Ao root 3950m  Hodgkin's lymphoma ([facility]   neck/midchest area  with radiation   Hyperlipidemia 10/23/2011   Hypertension    Ischemic cardiomyopathy    a. 08/2016 Echo: EF 20-25%; b. 02/2017 cMRI: EF 43% (no indication for ICD based on this finding); c. 04/2019 Echo: EF 25-30%.   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   LIMA to LAD, RIMA to RCA, LRA to OM2   S/P radiation therapy 10/21/1992   Radiation therapy to chest and neck for Hodgkin's lymphoma   Unstable [place]ina ([place]2013      Surgical History:  Past Surgical History:  Procedure Laterality Date   CARDIAC CATHETERIZATION     CARPAL TUNNEL RELEASE     CORONARY ARTERY BYPASS GRAFT  10/24/2011   Procedure: CORONARY ARTERY BYPASS GRAFTING (CABG);  Surgeon: ClaRexene AlbertsD;  [facility];  Laterality: N/A;  Coronary Artery Bypass Grafting X 3 Using Left Internal  Mammary Artery, Right Internal Mammary Artery and Right Radial Artery   CORONARY STENT INTERVENTION N/A 02/15/2021   Procedure: CORONARY STENT INTERVENTION;  Surgeon: EndNelva BushD;  [facility];  Service: Cardiovascular;  Laterality: N/A;   LYMPH NODE BIOPSY  1994   left neck   RADIAL ARTERY HARVEST  10/24/2011   Procedure: RADIAL ARTERY HARVEST;  Surgeon: ClaRexene AlbertsD;  [facility];  Service: Vascular;  Laterality: Left;   RIGHT/LEFT HEART CATH AND CORONARY/GRAFT ANGIOGRAPHY N/A 02/15/2021   Procedure: RIGHT/LEFT HEART CATH AND CORONARY/GRAFT ANGIOGRAPHY;  Surgeon: Nelva Bush, MD;  Location: [facility];  Service: Cardiovascular;  Laterality: N/A;     Home Meds: Current Meds  Medication Sig   aspirin EC 81 MG tablet Take 81 mg by mouth every evening.   carvedilol (COREG) 6.25 MG tablet Take 1 tablet (6.25 mg total) by mouth 2 (two) times daily.   clopidogrel (PLAVIX) 75 MG tablet Take 1 tablet (75 mg total) by mouth daily with breakfast.   colchicine 0.6 MG tablet Take 0.6 mg by mouth daily as needed (GOUT  FLARES).   dapagliflozin propanediol (FARXIGA) 10 MG TABS tablet Take 1 tablet (10 mg total) by mouth daily with breakfast.   eplerenone (INSPRA) 25 MG tablet Take 1 tablet (25 mg total) by mouth at bedtime.   Evolocumab (REPATHA SURECLICK) 956 MG/ML SOAJ Inject 1 pen. into the skin every 14 (fourteen) days.   ezetimibe (ZETIA) 10 MG tablet Take 1 tablet (10 mg total) by mouth daily.   furosemide (LASIX) 40 MG tablet Take 0.5 tablets (20 mg total) by mouth daily as needed for fluid.   icosapent Ethyl (VASCEPA) 1 g capsule Take 2 capsules (2 g total) by mouth 2 (two) times daily.   pantoprazole (PROTONIX) 40 MG tablet Take 40 mg by mouth in the morning.   rosuvastatin (CRESTOR) 40 MG tablet Take 1 tablet (40 mg total) by mouth daily.   sacubitril-valsartan (ENTRESTO) 49-51 MG Take 1 tablet by mouth 2 (two) times daily.   senna (SENOKOT) 8.6 MG tablet Take 4 tablets by mouth at bedtime.   sodium zirconium cyclosilicate (LOKELMA) 10 g PACK packet Take 10 g by mouth daily.   sodium zirconium cyclosilicate (LOKELMA) 5 g packet Take 10 g by mouth daily.   vitamin B-12 (CYANOCOBALAMIN) 1000 MCG tablet Take 1 tablet (1,000 mcg total) by mouth daily.    Allergies:  Allergies  Allergen Reactions   Spironolactone Other (See Comments)    Caused Chest swelling/knots around breast area.      Social History   Socioeconomic History   Marital status: Married    Spouse name: Not on file   Number of children: Not on file   Years of education: Not on file   Highest education level: Not on file  Occupational History   Not on file  Tobacco Use   Smoking status: Former    Packs/day: 0.50    Types: Cigarettes    Quit date: 10/21/1996    Years since quitting: 24.7   Smokeless tobacco: Former    Types: Snuff    Quit date: 04/05/2019  Vaping Use   Vaping Use: Never used  Substance and Sexual Activity   Alcohol use: No   Drug use: No   Sexual activity: Yes  Other Topics Concern   Not on file   Social History Narrative   Not on file   Social Determinants of Health   Financial Resource Strain: Low Risk  (04/18/2021)   Overall Financial Resource Strain (CARDIA)    Difficulty of Paying Living Expenses: Not hard at all  Food Insecurity: No Food Insecurity (04/18/2021)   Hunger Vital Sign    Worried About Running Out of Food in the Last Year: Never true    [place] in the Last Year: Never true  Transportation Needs: No Transportation Needs (04/18/2021)   [place] -  Hydrologist (Medical): No    Lack of Transportation (Non-Medical): No  Physical Activity: Not on file  Stress: Not on file  Social Connections: Not on file  Intimate Partner Violence: Not on file     Family History  Problem Relation Age of Onset   Coronary artery disease Other    Heart disease Mother    Heart attack Father      ROS:  Please see the history of present illness.     All other systems reviewed and negative.    Physical Exam: Blood pressure 106/74, pulse 69, height '5\' 10"'$  (1.778 m), weight 215 lb 9.6 oz (97.8 kg), SpO2 97 %. General: Well developed, well nourished male in no acute distress. Head: Normocephalic, atraumatic, sclera non-icteric, no xanthomas, nares are without discharge. EENT: normal  Lymph Nodes:  none Neck: Negative for carotid bruits. JVD not elevated. Back:without scoliosis kyphosis Lungs: Clear bilaterally to auscultation without wheezes, rales, or rhonchi. Breathing is unlabored. Heart: RRR with S1 S2. No murmur . No rubs, or gallops appreciated. Abdomen: Soft, non-tender, non-distended with normoactive bowel sounds. No hepatomegaly. No rebound/guarding. No obvious abdominal masses. Msk:  Strength and tone appear normal for age. Extremities: No clubbing or cyanosis. No  edema.  Distal pedal pulses are 2+ and equal bilaterally. Skin: Warm and Dry Neuro: Alert and oriented X 3. CN III-XII intact Grossly normal sensory and motor function  . Psych:  Responds to questions appropriately with a normal affect.        EKG: Sinus rhythm at 69 20/17/45 Left bundle branch block with a monophasic fractionated QRS lead V6 triphasic QRS lead I    Assessment and Plan:  Nonischemic and ischemic cardiomyopathy CABG and recent interval stenting  History of Hodgkin's disease with mantle radiation and chemotherapy  Congestive heart failure left greater than right class III-B  Left bundle branch block  Lightheadedness  Fatigue    The patient has persistent left ventricular dysfunction despite revascularization in the setting of ischemic and nonischemic cardiomyopathy and left bundle branch block.  He is appropriately considered for ICD implantation for primary prevention; he is also however candidate for resynchronization based on his QRS duration and his symptoms.  The contributing cause of his cardiomyopathy portends some variability in the likelihood of significant improvement or probably in the range of 65- 75 80%  Continue i Entresto 49/51 but I am not sanguine that he will be able to tolerate it going forward   He is having significant lightheadedness.  his eplerenone had been decreased to 12.5 and then was uptitrated by pharmacy.  His carvedilol was also recently uptitrated; I wonder whether it may be contributing to his fatigue.  Rather than make any medication adjustments myself, I will reach out to the heart failure clinic I would asked him to consider 2 adjustments-1, bisoprolol as an alternative to carvedilol to see whether any of the fatigue is related to that drug, 2-to change his eplerenone back to 12.5 so as to try to mitigate lightheadedness.  Dr DM has corrected me on this that the lowest dose for eplerenone is 25       Virl Axe

## 2021-07-04 NOTE — Addendum Note (Signed)
Addended by: Michelle Nasuti on: 07/04/2021 10:32 AM   Modules accepted: Orders

## 2021-07-05 NOTE — Pre-Procedure Instructions (Signed)
Attempted to call patient regarding procedure instructions.  No answer 

## 2021-07-08 ENCOUNTER — Encounter (HOSPITAL_COMMUNITY)
Admission: RE | Disposition: A | Discharge status: Home / Self Care | Payer: Self-pay | Source: Home / Self Care | Attending: Internal Medicine

## 2021-07-08 ENCOUNTER — Ambulatory Visit (HOSPITAL_COMMUNITY): Payer: Managed Care, Other (non HMO)

## 2021-07-08 ENCOUNTER — Ambulatory Visit (HOSPITAL_COMMUNITY)
Admission: RE | Admit: 2021-07-08 | Discharge: 2021-07-08 | Disposition: A | Discharge status: Home / Self Care | Payer: Managed Care, Other (non HMO) | Attending: Internal Medicine | Admitting: Internal Medicine

## 2021-07-08 DIAGNOSIS — I5022 Chronic systolic (congestive) heart failure: Secondary | ICD-10-CM | POA: Diagnosis not present

## 2021-07-08 DIAGNOSIS — Z951 Presence of aortocoronary bypass graft: Secondary | ICD-10-CM | POA: Diagnosis not present

## 2021-07-08 DIAGNOSIS — I110 Hypertensive heart disease with heart failure: Secondary | ICD-10-CM | POA: Insufficient documentation

## 2021-07-08 DIAGNOSIS — Z955 Presence of coronary angioplasty implant and graft: Secondary | ICD-10-CM | POA: Diagnosis not present

## 2021-07-08 DIAGNOSIS — Z87891 Personal history of nicotine dependence: Secondary | ICD-10-CM | POA: Diagnosis not present

## 2021-07-08 DIAGNOSIS — Z8571 Personal history of Hodgkin lymphoma: Secondary | ICD-10-CM | POA: Insufficient documentation

## 2021-07-08 DIAGNOSIS — I255 Ischemic cardiomyopathy: Secondary | ICD-10-CM | POA: Insufficient documentation

## 2021-07-08 DIAGNOSIS — Z79899 Other long term (current) drug therapy: Secondary | ICD-10-CM | POA: Diagnosis not present

## 2021-07-08 DIAGNOSIS — I447 Left bundle-branch block, unspecified: Secondary | ICD-10-CM | POA: Diagnosis not present

## 2021-07-08 DIAGNOSIS — I2510 Atherosclerotic heart disease of native coronary artery without angina pectoris: Secondary | ICD-10-CM | POA: Insufficient documentation

## 2021-07-08 DIAGNOSIS — Z923 Personal history of irradiation: Secondary | ICD-10-CM | POA: Diagnosis not present

## 2021-07-08 DIAGNOSIS — Z9221 Personal history of antineoplastic chemotherapy: Secondary | ICD-10-CM | POA: Insufficient documentation

## 2021-07-08 DIAGNOSIS — R42 Dizziness and giddiness: Secondary | ICD-10-CM | POA: Insufficient documentation

## 2021-07-08 HISTORY — PX: BIV ICD INSERTION CRT-D: EP1195

## 2021-07-08 SURGERY — BIV ICD INSERTION CRT-D
Anesthesia: LOCAL

## 2021-07-08 MED ORDER — LIDOCAINE HCL (PF) 1 % IJ SOLN
INTRAMUSCULAR | Status: AC
  Filled 2021-07-08: qty 60

## 2021-07-08 MED ORDER — MIDAZOLAM HCL 5 MG/5ML IJ SOLN
INTRAMUSCULAR | Status: DC | PRN
  Administered 2021-07-08 (×2): 1 mg via INTRAVENOUS
  Administered 2021-07-08: 2 mg via INTRAVENOUS
  Administered 2021-07-08: 1 mg via INTRAVENOUS

## 2021-07-08 MED ORDER — ONDANSETRON HCL 4 MG/2ML IJ SOLN: 4.0000 mg | Freq: Four times a day (QID) | INTRAMUSCULAR | Status: DC | PRN

## 2021-07-08 MED ORDER — FENTANYL CITRATE (PF) 100 MCG/2ML IJ SOLN
INTRAMUSCULAR | Status: AC
  Filled 2021-07-08: qty 2

## 2021-07-08 MED ORDER — IOHEXOL 350 MG/ML SOLN: INTRAVENOUS | Status: DC | PRN

## 2021-07-08 MED ORDER — SODIUM CHLORIDE 0.9 % IV SOLN: INTRAVENOUS | Status: DC

## 2021-07-08 MED ORDER — SODIUM CHLORIDE 0.9 % IV SOLN
INTRAVENOUS | Status: AC
  Filled 2021-07-08: qty 2

## 2021-07-08 MED ORDER — CHLORHEXIDINE GLUCONATE 4 % EX LIQD
4.0000 | Freq: Once | CUTANEOUS | Status: DC
  Filled 2021-07-08: qty 60

## 2021-07-08 MED ORDER — MIDAZOLAM HCL 5 MG/5ML IJ SOLN
INTRAMUSCULAR | Status: AC
  Filled 2021-07-08: qty 5

## 2021-07-08 MED ORDER — IODIXANOL 320 MG/ML IV SOLN
INTRAVENOUS | Status: DC | PRN
  Administered 2021-07-08: 16 mL

## 2021-07-08 MED ORDER — CEFAZOLIN SODIUM-DEXTROSE 2-4 GM/100ML-% IV SOLN
INTRAVENOUS | Status: AC
  Filled 2021-07-08: qty 100

## 2021-07-08 MED ORDER — ACETAMINOPHEN 325 MG PO TABS: 325.0000 mg | ORAL_TABLET | ORAL | Status: DC | PRN

## 2021-07-08 MED ORDER — CEFAZOLIN SODIUM-DEXTROSE 2-4 GM/100ML-% IV SOLN
2.0000 g | INTRAVENOUS | Status: AC
  Administered 2021-07-08: 2 g via INTRAVENOUS

## 2021-07-08 MED ORDER — SODIUM CHLORIDE 0.9 % IV SOLN
80.0000 mg | INTRAVENOUS | Status: AC
  Administered 2021-07-08: 80 mg

## 2021-07-08 MED ORDER — HEPARIN (PORCINE) IN NACL 1000-0.9 UT/500ML-% IV SOLN
INTRAVENOUS | Status: DC | PRN
  Administered 2021-07-08: 500 mL

## 2021-07-08 MED ORDER — HEPARIN (PORCINE) IN NACL 1000-0.9 UT/500ML-% IV SOLN
INTRAVENOUS | Status: AC
  Filled 2021-07-08: qty 500

## 2021-07-08 MED ORDER — LIDOCAINE HCL (PF) 1 % IJ SOLN
INTRAMUSCULAR | Status: DC | PRN
  Administered 2021-07-08: 50 mL

## 2021-07-08 MED ORDER — FENTANYL CITRATE (PF) 100 MCG/2ML IJ SOLN
INTRAMUSCULAR | Status: DC | PRN
  Administered 2021-07-08: 50 ug via INTRAVENOUS
  Administered 2021-07-08 (×3): 25 ug via INTRAVENOUS

## 2021-07-08 SURGICAL SUPPLY — 17 items
CABLE SURGICAL S-101-97-12 (CABLE) ×3 IMPLANT
CATH CPS DIRECT 135 DS2C020 (CATHETERS) ×1 IMPLANT
CATH CPS LOCATOR 3D MED (CATHETERS) ×1 IMPLANT
CATH CPS QUART CN DS2N029-65 (CATHETERS) ×1 IMPLANT
ICD UNIFY ASURA CRT CD3357-40C (ICD Generator) ×1 IMPLANT
LEAD DURATA 7122-65CM (Lead) ×1 IMPLANT
LEAD SELECT SECURE 3830 383069 (Lead) IMPLANT
LEAD TENDRIL MRI 52CM LPA1200M (Lead) ×1 IMPLANT
PAD DEFIB RADIO PHYSIO CONN (PAD) ×2 IMPLANT
SELECT SECURE 3830 383069 (Lead) ×2 IMPLANT
SHEATH 7FR PRELUDE SNAP 13 (SHEATH) ×1 IMPLANT
SHEATH 8FR PRELUDE SNAP 13 (SHEATH) ×1 IMPLANT
SHEATH 9.5FR PRELUDE SNAP 13 (SHEATH) ×1 IMPLANT
SHEATH WORLEY 9FR 62CM (SHEATH) IMPLANT
SLITTER AGILIS HISPRO (INSTRUMENTS) ×1 IMPLANT
TRAY PACEMAKER INSERTION (PACKS) ×2 IMPLANT
WIRE HI TORQ VERSACORE-J 145CM (WIRE) ×2 IMPLANT

## 2021-07-08 NOTE — Progress Notes (Signed)
Per Dr. Caryl Comes, if CXR shows no pleural effusion patient can be discharged. Patients CXR shows no pleural effusion or other abnormalities. Patient to be discharged per order.

## 2021-07-08 NOTE — Discharge Instructions (Signed)
After Your ICD (Implantable Cardiac Defibrillator)   You have a [employer] ICD  ACTIVITY Do not lift your arm above shoulder height for 1 week after your procedure. After 7 days, you may progress as below.  You should remove your sling 24 hours after your procedure, unless otherwise instructed by your provider.     Monday July 15, 2021  Tuesday July 16, 2021 Wednesday July 17, 2021 Thursday July 18, 2021   Do not lift, push, pull, or carry anything over 10 pounds with the affected arm until 6 weeks (Monday August 19, 2021 ) after your procedure.   You may drive AFTER your wound check, unless you have been told otherwise by your provider.   Ask your healthcare provider when you can go back to work   INCISION/Dressing If you are on a blood thinner such as Coumadin, Xarelto, Eliquis, Plavix, or Pradaxa please confirm with your provider when this should be resumed.   If large square, outer bandage is left in place, this can be removed after 24 hours from your procedure. Do not remove steri-strips or glue as below.   Monitor your defibrillator site for redness, swelling, and drainage. Call the device clinic at [phone] if you experience these symptoms or fever/chills.  If your incision is sealed with Steri-strips or staples, you may shower 7 days after your procedure or when told by your provider. Do not remove the steri-strips or let the shower hit directly on your site. You may wash around your site with soap and water.    If you were discharged in a sling, please do not wear this during the day more than 48 hours after your surgery unless otherwise instructed. This may increase the risk of stiffness and soreness in your shoulder.   Avoid lotions, ointments, or perfumes over your incision until it is well-healed.  You may use a hot tub or a pool AFTER your wound check appointment if the incision is completely closed.  Your ICD is designed to protect you from life threatening heart  rhythms. Because of this, you may receive a shock.   1 shock with no symptoms:  Call the office during business hours. 1 shock with symptoms (chest pain, chest pressure, dizziness, lightheadedness, shortness of breath, overall feeling unwell):  Call 911. If you experience 2 or more shocks in 24 hours:  Call 911. If you receive a shock, you should not drive for 6 months per the [place] DMV IF you receive appropriate therapy from your ICD.   ICD Alerts:  Some alerts are vibratory and others beep. These are NOT emergencies. Please call our office to let us know. If this occurs at night or on weekends, it can wait until the next business day. Send a remote transmission.  If your device is capable of reading fluid status (for heart failure), you will be offered monthly monitoring to review this with you.   DEVICE MANAGEMENT Remote monitoring is used to monitor your ICD from home. This monitoring is scheduled every 91 days by our office. It allows [place] to keep an eye on the functioning of your device to ensure it is working properly. You will routinely see your Electrophysiologist annually (more often if necessary).   You should receive your ID card for your new device in 4-8 weeks. Keep this card with you at all times once received. Consider wearing a medical alert bracelet or necklace.  Your ICD  may be MRI compatible. This will be discussed at your  next office visit/wound check.  You should avoid contact with strong electric or magnetic fields.   Do not use amateur (ham) radio equipment or electric (arc) welding torches. MP3 player headphones with magnets should not be used. Some devices are safe to use if held at least 12 inches (30 cm) from your defibrillator. These include power tools, lawn mowers, and speakers. If you are unsure if something is safe to use, ask your health care provider.  When using your cell phone, hold it to the ear that is on the opposite side from the defibrillator. Do not leave  your cell phone in a pocket over the defibrillator.  You may safely use electric blankets, heating pads, computers, and microwave ovens.  Call the office right away if: You have chest pain. You feel more than one shock. You feel more short of breath than you have felt before. You feel more light-headed than you have felt before. Your incision starts to open up.  This information is not intended to replace advice given to you by your health care provider. Make sure you discuss any questions you have with your health care provider.

## 2021-07-08 NOTE — Interval H&P Note (Signed)
History and Physical Interval Note:  07/08/2021 12:23 PM  Willie [name]  has presented today for surgery, with the diagnosis of cardiomyopathy.  The various methods of treatment have been discussed with the patient and family. After consideration of risks, benefits and other options for treatment, the patient has consented to  Procedure(s): BIV ICD INSERTION CRT-D (N/A) as a surgical intervention.  The patient's history has been reviewed, patient examined, no change in status, stable for surgery.  I have reviewed the patient's chart and labs.  Questions were answered to the patient's satisfaction.     Virl Axe  Have reviewed the potential benefits and risks of ICD implantation including but not limited to death, perforation of heart or lung, lead dislodgement, infection,  device malfunction and inappropriate shocks.  The patient expresses understanding  and is willing to proceed.

## 2021-07-10 ENCOUNTER — Encounter (HOSPITAL_COMMUNITY): Payer: Self-pay | Admitting: Internal Medicine

## 2021-07-10 ENCOUNTER — Encounter: Payer: Self-pay | Admitting: Internal Medicine

## 2021-07-10 NOTE — Telephone Encounter (Signed)
Received new card and will call patient on Friday to have him come and pick it up.

## 2021-07-10 NOTE — Telephone Encounter (Signed)
Called over to Cath lab and spoke with Bevely Palmer. She is going to look into getting card and will call me back.

## 2021-07-10 NOTE — Telephone Encounter (Addendum)
The patient has been notified of the result via his mychart. Left detailed message on voicemail and informed patient to call back.

## 2021-07-12 ENCOUNTER — Encounter: Payer: Self-pay | Admitting: Internal Medicine

## 2021-07-12 ENCOUNTER — Encounter: Payer: Self-pay | Admitting: Pharmacist

## 2021-07-12 NOTE — Telephone Encounter (Signed)
Follow-up after same day discharge: Implant date: 07/08/21 MD: Virl Axe, MD Device: SJ CRT-D Location: left chest   Wound check visit: 07/24/21 at 11:20 52 day MD follow-up: 10/10/21 at 3:30  Remote Transmission received:  Dressing/sling removed: Yes  Confirm OAC restart on: Confirmed patient will restart Plavix 07/14/21  Successful telephone encounter to patient to address his MyChart questions related to activity post CRT-D implant. Reviewed procedure discharge instructions with patient in detail. Confirmed appointments as above. Patient will call device clinic at [phone] if additional questions or concerns should arise.

## 2021-07-14 ENCOUNTER — Encounter: Payer: Self-pay | Admitting: Internal Medicine

## 2021-07-16 NOTE — Addendum Note (Signed)
Addended by: Freada Bergeron on: 07/16/2021 11:47 AM   Modules accepted: Orders

## 2021-07-16 NOTE — Telephone Encounter (Signed)
Per dpr left message on voicemail and asked to have the patient to call back.Willie [name], [place] 05/16/2021 12:08 PM

## 2021-07-16 NOTE — Telephone Encounter (Signed)
Return call: The patient has been notified of the result and verbalized understanding.  All questions (if any) were answered. Willie [name], [place] 07/16/2021 11:43 AM    Upon patient request DME selection is [name] Patient understands he will be contacted by [facility] to set up his cpap. Patient understands to call if [name] does not contact him with new setup in a timely manner. Patient understands they will be called once confirmation has been received from [employer]/ that they have received their new machine to schedule 10 week follow up appointment.   [name] notified of new cpap order  Please add to airview Patient was grateful for the call and thanked me.

## 2021-07-16 NOTE — Telephone Encounter (Signed)
The patient has been notified of the result. Left detailed message on voicemail and informed patient to call back.Earle Gell, [place] 05/16/2021 12:08 PM

## 2021-07-17 ENCOUNTER — Inpatient Hospital Stay (HOSPITAL_COMMUNITY)
Admission: RE | Admit: 2021-07-17 | Discharge: 2021-07-17 | Disposition: A | Discharge status: Home / Self Care | Payer: Managed Care, Other (non HMO) | Source: Ambulatory Visit

## 2021-07-24 ENCOUNTER — Ambulatory Visit (INDEPENDENT_AMBULATORY_CARE_PROVIDER_SITE_OTHER): Payer: Managed Care, Other (non HMO)

## 2021-07-24 DIAGNOSIS — I255 Ischemic cardiomyopathy: Secondary | ICD-10-CM

## 2021-07-24 LAB — CUP PACEART INCLINIC DEVICE CHECK
Battery Remaining Longevity: 72 mo
Brady Statistic RA Percent Paced: 2.6 %
Brady Statistic RV Percent Paced: 99.95 %
Date Time Interrogation Session: 20230719112200
HighPow Impedance: 64.125
Implantable Lead Implant Date: 20230703
Implantable Lead Implant Date: 20230703
Implantable Lead Implant Date: 20230703
Implantable Lead Location: 753858
Implantable Lead Location: 753859
Implantable Lead Location: 753860
Implantable Lead Model: 3830
Implantable Lead Model: 7122
Implantable Pulse Generator Implant Date: 20230703
Lead Channel Impedance Value: 512.5 Ohm
Lead Channel Impedance Value: 662.5 Ohm
Lead Channel Impedance Value: 675 Ohm
Lead Channel Pacing Threshold Amplitude: 0.5 V
Lead Channel Pacing Threshold Amplitude: 0.5 V
Lead Channel Pacing Threshold Amplitude: 0.5 V
Lead Channel Pacing Threshold Amplitude: 0.5 V
Lead Channel Pacing Threshold Amplitude: 0.5 V
Lead Channel Pacing Threshold Amplitude: 0.5 V
Lead Channel Pacing Threshold Pulse Width: 0.05 ms
Lead Channel Pacing Threshold Pulse Width: 0.05 ms
Lead Channel Pacing Threshold Pulse Width: 0.5 ms
Lead Channel Pacing Threshold Pulse Width: 0.5 ms
Lead Channel Pacing Threshold Pulse Width: 0.5 ms
Lead Channel Pacing Threshold Pulse Width: 0.5 ms
Lead Channel Sensing Intrinsic Amplitude: 11.9 mV
Lead Channel Sensing Intrinsic Amplitude: 5 mV
Lead Channel Setting Pacing Amplitude: 0.25 V
Lead Channel Setting Pacing Amplitude: 3.5 V
Lead Channel Setting Pacing Amplitude: 3.5 V
Lead Channel Setting Pacing Pulse Width: 0.05 ms
Lead Channel Setting Pacing Pulse Width: 0.5 ms
Lead Channel Setting Sensing Sensitivity: 0.5 mV
Pulse Gen Serial Number: 8906747

## 2021-07-24 MED ORDER — ENTRESTO 24-26 MG PO TABS: 1.0000 | ORAL_TABLET | Freq: Two times a day (BID) | ORAL | 3 refills | Status: DC

## 2021-07-24 NOTE — Patient Instructions (Signed)
   After Your ICD (Implantable Cardiac Defibrillator)    Monitor your defibrillator site for redness, swelling, and drainage. Call the device clinic at [phone] if you experience these symptoms or fever/chills.  Your incision was closed with Steri-strips or staples:  You may shower 7 days after your procedure and wash your incision with soap and water. Avoid lotions, ointments, or perfumes over your incision until it is well-healed.  You may use a hot tub or a pool after your wound check appointment if the incision is completely closed.  Do not lift, push or pull greater than 10 pounds with the affected arm until 6 weeks after your procedure. There are no other restrictions in arm movement after your wound check appointment.  Your ICD is not MRI compatible.  Your ICD is designed to protect you from life threatening heart rhythms. Because of this, you may receive a shock.   1 shock with no symptoms:  Call the office during business hours. 1 shock with symptoms (chest pain, chest pressure, dizziness, lightheadedness, shortness of breath, overall feeling unwell):  Call 911. If you experience 2 or more shocks in 24 hours:  Call 911. If you receive a shock, you should not drive.  [place] DMV - no driving for 6 months if you receive appropriate therapy from your ICD.   ICD Alerts:  Some alerts are vibratory and others beep. These are NOT emergencies. Please call our office to let us know. If this occurs at night or on weekends, it can wait until the next business day. Send a remote transmission.  If your device is capable of reading fluid status (for heart failure), you will be offered monthly monitoring to review this with you.   Remote monitoring is used to monitor your ICD from home. This monitoring is scheduled every 91 days by our office. It allows [place] to keep an eye on the functioning of your device to ensure it is working properly. You will routinely see your Electrophysiologist annually  (more often if necessary).

## 2021-07-24 NOTE — Progress Notes (Signed)

## 2021-08-04 ENCOUNTER — Encounter (HOSPITAL_COMMUNITY): Payer: Self-pay | Admitting: Cardiology

## 2021-08-05 ENCOUNTER — Encounter (HOSPITAL_COMMUNITY): Payer: Self-pay | Admitting: Cardiology

## 2021-08-05 ENCOUNTER — Ambulatory Visit (HOSPITAL_COMMUNITY)
Admission: RE | Admit: 2021-08-05 | Discharge: 2021-08-05 | Disposition: A | Discharge status: Home / Self Care | Payer: Managed Care, Other (non HMO) | Source: Ambulatory Visit | Attending: Cardiology | Admitting: Cardiology

## 2021-08-05 VITALS — BP 110/70 | HR 78 | Wt 214.4 lb

## 2021-08-05 DIAGNOSIS — R42 Dizziness and giddiness: Secondary | ICD-10-CM | POA: Insufficient documentation

## 2021-08-05 DIAGNOSIS — N644 Mastodynia: Secondary | ICD-10-CM | POA: Insufficient documentation

## 2021-08-05 DIAGNOSIS — I255 Ischemic cardiomyopathy: Secondary | ICD-10-CM | POA: Insufficient documentation

## 2021-08-05 DIAGNOSIS — Z951 Presence of aortocoronary bypass graft: Secondary | ICD-10-CM | POA: Insufficient documentation

## 2021-08-05 DIAGNOSIS — I5022 Chronic systolic (congestive) heart failure: Secondary | ICD-10-CM | POA: Diagnosis not present

## 2021-08-05 DIAGNOSIS — E785 Hyperlipidemia, unspecified: Secondary | ICD-10-CM | POA: Diagnosis not present

## 2021-08-05 DIAGNOSIS — I2510 Atherosclerotic heart disease of native coronary artery without angina pectoris: Secondary | ICD-10-CM | POA: Diagnosis present

## 2021-08-05 DIAGNOSIS — Z7902 Long term (current) use of antithrombotics/antiplatelets: Secondary | ICD-10-CM | POA: Insufficient documentation

## 2021-08-05 DIAGNOSIS — Z955 Presence of coronary angioplasty implant and graft: Secondary | ICD-10-CM | POA: Diagnosis present

## 2021-08-05 DIAGNOSIS — Z8571 Personal history of Hodgkin lymphoma: Secondary | ICD-10-CM | POA: Diagnosis not present

## 2021-08-05 DIAGNOSIS — G4733 Obstructive sleep apnea (adult) (pediatric): Secondary | ICD-10-CM | POA: Insufficient documentation

## 2021-08-05 DIAGNOSIS — Z79899 Other long term (current) drug therapy: Secondary | ICD-10-CM | POA: Insufficient documentation

## 2021-08-05 LAB — BASIC METABOLIC PANEL
Anion gap: 6 (ref 5–15)
BUN: 15 mg/dL (ref 6–20)
CO2: 26 mmol/L (ref 22–32)
Calcium: 9.6 mg/dL (ref 8.9–10.3)
Chloride: 106 mmol/L (ref 98–111)
Creatinine, Ser: 1.26 mg/dL — ABNORMAL HIGH (ref 0.61–1.24)
GFR, Estimated: >60 mL/min (ref >60)
Glucose, Bld: 99 mg/dL (ref 70–99)
Potassium: 4.3 mmol/L (ref 3.5–5.1)
Sodium: 138 mmol/L (ref 135–145)

## 2021-08-05 MED ORDER — EPLERENONE 25 MG PO TABS: 50.0000 mg | ORAL_TABLET | Freq: Every day | ORAL | 11 refills | Status: DC

## 2021-08-05 NOTE — Patient Instructions (Addendum)
Increase Eplerenone to '50mg'$  daily.  Labs done today, your results will be available in MyChart, we will contact you for abnormal readings.  Repeat blood work in 10 days.  Your physician has requested that you have an echocardiogram. Echocardiography is a painless test that uses sound waves to create images of your heart. It provides your doctor with information about the size and shape of your heart and how well your heart's chambers and valves are working. This procedure takes approximately one hour. There are no restrictions for this procedure.  Your physician recommends that you schedule a follow-up appointment in: 3 months with echocardiogram   If you have any questions or concerns before your next appointment please send [place] a message through [place] or call our office at [phone].    TO LEAVE A MESSAGE FOR THE NURSE SELECT OPTION 2, PLEASE LEAVE A MESSAGE INCLUDING: YOUR NAME DATE OF BIRTH CALL BACK NUMBER REASON FOR CALL**this is important as we prioritize the call backs  YOU WILL RECEIVE A CALL BACK THE SAME DAY AS LONG AS YOU CALL BEFORE 4:00 PM  At the [facility], you and your health needs are our priority. As part of our continuing mission to provide you with exceptional heart care, we have created designated Provider Care Teams. These Care Teams include your primary Cardiologist (physician) and Advanced Practice Providers (APPs- Physician Assistants and Nurse Practitioners) who all work together to provide you with the care you need, when you need it.   You may see any of the following providers on your designated Care Team at your next follow up: Dr Glori Bickers Dr Haynes Kerns, NP Lyda Jester, [place] [facility] [place], [place] Audry Riles, PharmD   Please be sure to bring in all your medications bottles to every appointment.

## 2021-08-05 NOTE — Progress Notes (Signed)
PCP: Tracie Harrier, MD Cardiology: Dr. Rockey Situ HF Cardiology: Dr. Aundra Dubin  56 y.o. with history of CAD s/p CABG, ischemic cardiomyopathy, and prior Hodgkins lymphoma was referred by Dr. Rockey Situ for CHF clinic evaluation.  Patient had Hodgkins disease diagnosed in 1994 and was treated with chemotherapy and radiation.  A strong family history of CAD and also the chest wall radiation with Hodgkins treatment likely predisposed him to premature CAD.  He had CABG in 2013. Echo at the time of CABG showed EF 30%.  EF has been low since that time except for cMRI done in 2/19 which showed EF 43%.  Most recent echo in 2/23 showed EF 20-25%, normal RV.  He had worsening dyspnea as well as chest pain in 1/23 and 2/23.  Cath was done, showing 90% proximal-mid RCA stenosis with atretic RIMA-PDA.  This was treated with DES x 2.  RHC showed prominent RV failure.    Echo in 5/23 showed EF 25-30% with septal-lateral dyssynchrony and mild RV dysfunction. Patient had St Jude ICD with left bundle lead placed.    He returns for followup of CHF.  Weight is stable.  He is still fatigued after taking his meds but denies lightheadedness since decreasing his Entresto dose.  No dyspnea walking on flat ground or when walking up the hill to his mailbox.  No chest pain.  No orthopnea/PND.    Labs (2/23): LDL 119, TGs 291, K 5.3 (stopped KCl), creatinine 1.18 Labs (5/23): K 5.3, creatinine 1.3, LDL 16, TGs 69 Labs (6/23): K 4.4, creatinine 1.25  ECG (personally reviewed): NSR, LB-pacing  PMH: 1. Hodgkins disease: Treated with chemotherapy and chest radiation in 1994.  2. CAD: CABG x 3 in 2013 with LIMA-LAD, RIMA-RCA, free radial-OM2.  - LHC (2/23): 90% proximal-mid RCA stenosis with atretic RIMA-RCA, patient had DES x 2 to proximal and mid RCA.  3. H/o COVID 4. Hyperlipidemia 5. Gout 6. Chronic systolic CHF: Ischemic cardiomyopathy.  He has a [employer] ICD with left bundle lead.  - Echo (10/13): EF 30% - Echo (2018): EF  20-25%, moderate RV dysfunction.  - Cardiac MRI (2/19): EF 43%, minimal coronary pattern LGE.   - Echo (4/21): EF 20-25%, normal RV - Echo (2/23): EF 20-25%, normal RV - RHC (2/23): mean RA 18, PA 33/18 mean 23, PAPI < 1, CI 2.0 Fick/2.5 thermo.  - Echo (5/23): EF 25-30% with septal-lateral dyssynchrony and mild RV dysfunction. 7. OSA: Moderate.   Social History   Socioeconomic History   Marital status: Married    Spouse name: Not on file   Number of children: Not on file   Years of education: Not on file   Highest education level: Not on file  Occupational History   Not on file  Tobacco Use   Smoking status: Former    Packs/day: 0.50    Types: Cigarettes    Quit date: 10/21/1996    Years since quitting: 24.8   Smokeless tobacco: Former    Types: Snuff    Quit date: 04/05/2019  Vaping Use   Vaping Use: Never used  Substance and Sexual Activity   Alcohol use: No   Drug use: No   Sexual activity: Yes  Other Topics Concern   Not on file  Social History Narrative   Not on file   Social Determinants of Health   Financial Resource Strain: Low Risk  (04/18/2021)   Overall Financial Resource Strain (CARDIA)    Difficulty of Paying Living Expenses: Not hard at all  Food Insecurity: No Food Insecurity (04/18/2021)   Hunger Vital Sign    Worried About Running Out of Food in the Last Year: Never true    Ran Out of Food in the Last Year: Never true  Transportation Needs: No Transportation Needs (04/18/2021)   PRAPARE - Hydrologist (Medical): No    Lack of Transportation (Non-Medical): No  Physical Activity: Not on file  Stress: Not on file  Social Connections: Not on file  Intimate Partner Violence: Not on file   Family History  Problem Relation Age of Onset   Coronary artery disease Other    Heart disease Mother    Heart attack Father    ROS: All systems reviewed and negative except as per HPI.   Current Outpatient Medications  Medication  Sig Dispense Refill   aspirin EC 81 MG tablet Take 81 mg by mouth every evening.     carvedilol (COREG) 6.25 MG tablet Take 1 tablet (6.25 mg total) by mouth 2 (two) times daily. 60 tablet 11   clopidogrel (PLAVIX) 75 MG tablet Take 1 tablet (75 mg total) by mouth daily with breakfast. 90 tablet 3   colchicine 0.6 MG tablet Take 0.6 mg by mouth daily as needed (GOUT FLARES).     dapagliflozin propanediol (FARXIGA) 10 MG TABS tablet Take 1 tablet (10 mg total) by mouth daily with breakfast. 90 tablet 3   Evolocumab (REPATHA SURECLICK) 948 MG/ML SOAJ Inject 1 pen. into the skin every 14 (fourteen) days. 6 mL 3   furosemide (LASIX) 40 MG tablet Take 0.5 tablets (20 mg total) by mouth daily as needed for fluid. 30 tablet 3   icosapent Ethyl (VASCEPA) 1 g capsule Take 2 capsules (2 g total) by mouth 2 (two) times daily. 120 capsule 11   pantoprazole (PROTONIX) 40 MG tablet Take 40 mg by mouth in the morning.     rosuvastatin (CRESTOR) 40 MG tablet Take 1 tablet (40 mg total) by mouth daily. 90 tablet 3   sacubitril-valsartan (ENTRESTO) 24-26 MG Take 1 tablet by mouth 2 (two) times daily. 180 tablet 3   senna (SENOKOT) 8.6 MG tablet Take 4 tablets by mouth daily with supper.     sodium zirconium cyclosilicate (LOKELMA) 10 g PACK packet Take 10 g by mouth daily. 90 packet 3   vitamin B-12 (CYANOCOBALAMIN) 1000 MCG tablet Take 1 tablet (1,000 mcg total) by mouth daily. 30 tablet 1   eplerenone (INSPRA) 25 MG tablet Take 2 tablets (50 mg total) by mouth at bedtime. 30 tablet 11   No current facility-administered medications for this encounter.   BP 110/70   Pulse 78   Wt 97.3 kg (214 lb 6.4 oz)   SpO2 99%   BMI 32.60 kg/m  General: NAD Neck: No JVD, no thyromegaly or thyroid nodule.  Lungs: Clear to auscultation bilaterally with normal respiratory effort. CV: Nondisplaced PMI.  Heart regular S1/S2, no S3/S4, no murmur.  No peripheral edema.  No carotid bruit.  Normal pedal pulses.  Abdomen: Soft,  nontender, no hepatosplenomegaly, no distention.  Skin: Intact without lesions or rashes.  Neurologic: Alert and oriented x 3.  Psych: Normal affect. Extremities: No clubbing or cyanosis.  HEENT: Normal.   Assessment/Plan: 1. CAD: s/p CABG in 2013 and DES x 2 to native RCA in 2/23 with atretic RIMA-PDA noted. No further chest pain since PCI.  - Continue ASA 81 and Plavix 75.  - Continue Crestor and Zetia, good lipids in 5/23.  2. Hyperlipidemia: He is now on Repatha, Crestor, Vascepa. Good lipids in 5/23.  3. Chronic systolic CHF: Ischemic cardiomyopathy. Echo in 2/23 with EF 20-25%, normal RV.  [place] in 2/23 showed prominent RV failure with PAPI < 1.  Echo in 5/23 showed EF 25-30% with septal-lateral dyssynchrony and mild RV dysfunction.  St Jude ICD with left bundle lead was placed.  NYHA class II, not volume overloaded.   - Breast pain with spironolactone, now on eplerenone 25 mg daily.  K controlled by [facility], currently at 10 g daily.  Increase eplerenone to 50 mg daily with BMET today and in 10 days.   - Continue Coreg 6.25 mg bid.   - Continue Entresto 24/26 bid, he was lightheaded when we tried to increase.  - Continue Farxiga 10 mg daily.  - Repeat echo at followup in 3 months.  4. OSA: Waiting for CPAP.   Followup in 3 months with echo.   Loralie Champagne 08/05/2021

## 2021-08-06 ENCOUNTER — Other Ambulatory Visit (HOSPITAL_COMMUNITY): Payer: Self-pay | Admitting: *Deleted

## 2021-08-06 ENCOUNTER — Encounter (HOSPITAL_COMMUNITY): Payer: Self-pay | Admitting: Cardiology

## 2021-08-06 MED ORDER — EPLERENONE 25 MG PO TABS: 50.0000 mg | ORAL_TABLET | Freq: Every day | ORAL | 11 refills | Status: DC

## 2021-08-09 NOTE — Telephone Encounter (Signed)
Left detailed message on voicemail and informed patient to call his dme.

## 2021-08-09 NOTE — Telephone Encounter (Signed)
From: [employer] [email or web address]> Sent: Friday, August 09, 2021 12:50:30 PM To: Melissa Stenson [email or web address]> Subject: PID: [id number] No contact   Good afternoon,  [name] below, Willie, [name], (ID: [id number]), has not been able to be contacted. We've spoken with him once last month for scheduling and have been unsuccessful in contacting since.    [employer], [employer]

## 2021-08-14 ENCOUNTER — Other Ambulatory Visit
Admission: RE | Admit: 2021-08-14 | Discharge: 2021-08-14 | Disposition: A | Discharge status: Home / Self Care | Payer: Managed Care, Other (non HMO) | Attending: Cardiology | Admitting: Cardiology

## 2021-08-14 DIAGNOSIS — I5022 Chronic systolic (congestive) heart failure: Secondary | ICD-10-CM | POA: Diagnosis present

## 2021-08-14 LAB — BASIC METABOLIC PANEL
Anion gap: 4 — ABNORMAL LOW (ref 5–15)
BUN: 15 mg/dL (ref 6–20)
CO2: 27 mmol/L (ref 22–32)
Calcium: 9.4 mg/dL (ref 8.9–10.3)
Chloride: 109 mmol/L (ref 98–111)
Creatinine, Ser: 1.34 mg/dL — ABNORMAL HIGH (ref 0.61–1.24)
GFR, Estimated: >60 mL/min (ref >60)
Glucose, Bld: 100 mg/dL — ABNORMAL HIGH (ref 70–99)
Potassium: 5.1 mmol/L (ref 3.5–5.1)
Sodium: 140 mmol/L (ref 135–145)

## 2021-08-20 ENCOUNTER — Encounter: Payer: Self-pay | Admitting: Internal Medicine

## 2021-09-30 ENCOUNTER — Ambulatory Visit (INDEPENDENT_AMBULATORY_CARE_PROVIDER_SITE_OTHER): Payer: Managed Care, Other (non HMO) | Admitting: Internal Medicine

## 2021-09-30 VITALS — BP 138/101 | HR 73 | Resp 16 | Ht 68.0 in | Wt 218.5 lb

## 2021-09-30 DIAGNOSIS — E669 Obesity, unspecified: Secondary | ICD-10-CM

## 2021-09-30 DIAGNOSIS — I255 Ischemic cardiomyopathy: Secondary | ICD-10-CM | POA: Diagnosis not present

## 2021-09-30 DIAGNOSIS — G4733 Obstructive sleep apnea (adult) (pediatric): Secondary | ICD-10-CM | POA: Diagnosis not present

## 2021-09-30 DIAGNOSIS — E782 Mixed hyperlipidemia: Secondary | ICD-10-CM | POA: Insufficient documentation

## 2021-09-30 DIAGNOSIS — C8190 Hodgkin lymphoma, unspecified, unspecified site: Secondary | ICD-10-CM | POA: Insufficient documentation

## 2021-09-30 DIAGNOSIS — I519 Heart disease, unspecified: Secondary | ICD-10-CM | POA: Insufficient documentation

## 2021-09-30 DIAGNOSIS — I10 Essential (primary) hypertension: Secondary | ICD-10-CM

## 2021-09-30 NOTE — Patient Instructions (Signed)
Living With Sleep Apnea Sleep apnea is a condition in which breathing pauses or becomes shallow during sleep. Sleep apnea is most commonly caused by a collapsed or blocked airway. People with sleep apnea usually snore loudly. They may have times when they gasp and stop breathing for 10 seconds or more during sleep. This may happen many times during the night. The breaks in breathing also interrupt the deep sleep that you need to feel rested. Even if you do not completely wake up from the gaps in breathing, your sleep may not be restful and you feel tired during the day. You may also have a headache in the morning and low energy during the day, and you may feel anxious or depressed. How can sleep apnea affect me? Sleep apnea increases your chances of extreme tiredness during the day (daytime fatigue). It can also increase your risk for health conditions, such as: Heart attack. Stroke. Obesity. Type 2 diabetes. Heart failure. Irregular heartbeat. High blood pressure. If you have daytime fatigue as a result of sleep apnea, you may be more likely to: Perform poorly at school or work. Fall asleep while driving. Have difficulty with attention. Develop depression or anxiety. Have sexual dysfunction. What actions can I take to manage sleep apnea? Sleep apnea treatment  If you were given a device to open your airway while you sleep, use it only as told by your health care provider. You may be given: An oral appliance. This is a custom-made mouthpiece that shifts your lower jaw forward. A continuous positive airway pressure (CPAP) device. This device blows air through a mask when you breathe out (exhale). A nasal expiratory positive airway pressure (EPAP) device. This device has valves that you put into each nostril. A bi-level positive airway pressure (BIPAP) device. This device blows air through a mask when you breathe in (inhale) and breathe out (exhale). You may need surgery if other treatments  do not work for you. Sleep habits Go to sleep and wake up at the same time every day. This helps set your internal clock (circadian rhythm) for sleeping. If you stay up later than usual, such as on weekends, try to get up in the morning within 2 hours of your normal wake time. Try to get at least 7-9 hours of sleep each night. Stop using a computer, tablet, and mobile phone a few hours before bedtime. Do not take long naps during the day. If you nap, limit it to 30 minutes. Have a relaxing bedtime routine. Reading or listening to music may relax you and help you sleep. Use your bedroom only for sleep. Keep your television and computer out of your bedroom. Keep your bedroom cool, dark, and quiet. Use a supportive mattress and pillows. Follow your health care provider's instructions for other changes to sleep habits. Nutrition Do not eat heavy meals in the evening. Do not have caffeine in the later part of the day. The effects of caffeine can last for more than 5 hours. Follow your health care provider's or dietitian's instructions for any diet changes. Lifestyle     Do not drink alcohol before bedtime. Alcohol can cause you to fall asleep at first, but then it can cause you to wake up in the middle of the night and have trouble getting back to sleep. Do not use any products that contain nicotine or tobacco. These products include cigarettes, chewing tobacco, and vaping devices, such as e-cigarettes. If you need help quitting, ask your health care provider. Medicines Take   over-the-counter and prescription medicines only as told by your health care provider. Do not use over-the-counter sleep medicine. You can become dependent on this medicine, and it can make sleep apnea worse. Do not use medicines, such as sedatives and narcotics, unless told by your health care provider. Activity Exercise on most days, but avoid exercising in the evening. Exercising near bedtime can interfere with  sleeping. If possible, spend time outside every day. Natural light helps regulate your circadian rhythm. General information Lose weight if you need to, and maintain a healthy weight. Keep all follow-up visits. This is important. If you are having surgery, make sure to tell your health care provider that you have sleep apnea. You may need to bring your device with you. Where to find more information Learn more about sleep apnea and daytime fatigue from: American Sleep Association: sleepassociation.org National Sleep Foundation: sleepfoundation.org National Heart, Lung, and Blood Institute: nhlbi.nih.gov Summary Sleep apnea is a condition in which breathing pauses or becomes shallow during sleep. Sleep apnea can cause daytime fatigue and other serious health conditions. You may need to wear a device while sleeping to help keep your airway open. If you are having surgery, make sure to tell your health care provider that you have sleep apnea. You may need to bring your device with you. Making changes to sleep habits, diet, lifestyle, and activity can help you manage sleep apnea. This information is not intended to replace advice given to you by your health care provider. Make sure you discuss any questions you have with your health care provider. Document Revised: 08/01/2020 Document Reviewed: 12/02/2019 Elsevier Patient Education  2023 [employer]  

## 2021-09-30 NOTE — Progress Notes (Signed)
Sleep Medicine   Office Visit  Patient Name: Willie [name] DOB: 1965-04-09 MRN [id number]    Chief Complaint: sleep evaluation  Brief History:  Jacobo presents with a 3 month history of sleep apnea.  Sleep quality is good. This is noted most nights. The patient's bed partner reports  loud snoring and witnessed apnea at night. The patient relates the following symptoms: Morning headaches, Loud snoring, daytime sleepiness, lack of focus are also present. The patient goes to sleep at 9pm and wakes up at 4am.   Sleep quality is same when outside home environment.  Patient has noted restlessness of his legs at night.  The patient  relates kicking behavior during the night.  The patient denies a history of psychiatric problems. The Epworth Sleepiness Score is 9 out of 24 .  The patient relates  Cardiovascular risk factors include: ischemic cardiomyopathy, coronary artery disease, hypertension, mitral insufficiency.  The patient reports a previous home sleep study was done in June 2023 and showed an AHI of 21.     ROS  General: (-) fever, (-) chills, (-) night sweat Nose and Sinuses: (-) nasal stuffiness or itchiness, (-) postnasal drip, (-) nosebleeds, (-) sinus trouble. Mouth and Throat: (-) sore throat, (-) hoarseness. Neck: (-) swollen glands, (-) enlarged thyroid, (-) neck pain. Respiratory: + cough, + shortness of breath, - wheezing. Neurologic: - numbness, - tingling. Psychiatric: - anxiety, - depression Sleep behavior: -sleep paralysis -hypnogogic hallucinations -dream enactment      -vivid dreams -cataplexy -night terrors -sleep walking   Current Medication: Outpatient Encounter Medications as of 09/30/2021  Medication Sig   aspirin EC 81 MG tablet Take 81 mg by mouth every evening.   carvedilol (COREG) 6.25 MG tablet Take 1 tablet (6.25 mg total) by mouth 2 (two) times daily.   clopidogrel (PLAVIX) 75 MG tablet Take 1 tablet (75 mg total) by mouth daily with breakfast.    colchicine 0.6 MG tablet Take 0.6 mg by mouth daily as needed (GOUT FLARES).   dapagliflozin propanediol (FARXIGA) 10 MG TABS tablet Take 1 tablet (10 mg total) by mouth daily with breakfast.   eplerenone (INSPRA) 25 MG tablet Take 2 tablets (50 mg total) by mouth at bedtime.   Evolocumab (REPATHA SURECLICK) 974 MG/ML SOAJ Inject 1 pen. into the skin every 14 (fourteen) days.   furosemide (LASIX) 40 MG tablet Take 0.5 tablets (20 mg total) by mouth daily as needed for fluid.   icosapent Ethyl (VASCEPA) 1 g capsule Take 2 capsules (2 g total) by mouth 2 (two) times daily.   pantoprazole (PROTONIX) 40 MG tablet Take 40 mg by mouth in the morning.   rosuvastatin (CRESTOR) 40 MG tablet Take 1 tablet (40 mg total) by mouth daily.   sacubitril-valsartan (ENTRESTO) 24-26 MG Take 1 tablet by mouth 2 (two) times daily.   senna (SENOKOT) 8.6 MG tablet Take 4 tablets by mouth daily with supper.   sodium zirconium cyclosilicate (LOKELMA) 10 g PACK packet Take 10 g by mouth daily.   vitamin B-12 (CYANOCOBALAMIN) 1000 MCG tablet Take 1 tablet (1,000 mcg total) by mouth daily.   No facility-administered encounter medications on file as of 09/30/2021.    Surgical History: Past Surgical History:  Procedure Laterality Date   BIV ICD INSERTION CRT-D N/A 07/08/2021   Procedure: BIV ICD INSERTION CRT-D;  Surgeon: Deboraha Sprang, MD;  Location: [facility];  Service: Cardiovascular;  Laterality: N/A;   CARDIAC CATHETERIZATION     CARPAL TUNNEL RELEASE  CORONARY ARTERY BYPASS GRAFT  10/24/2011   Procedure: CORONARY ARTERY BYPASS GRAFTING (CABG);  Surgeon: Rexene Alberts, MD;  Location: [facility];  Service: Open Heart Surgery;  Laterality: N/A;  Coronary Artery Bypass Grafting X 3 Using Left Internal  Mammary Artery, Right Internal Mammary Artery and Right Radial Artery   CORONARY STENT INTERVENTION N/A 02/15/2021   Procedure: CORONARY STENT INTERVENTION;  Surgeon: Nelva Bush, MD;  Location: [facility];  Service: Cardiovascular;  Laterality: N/A;   LYMPH NODE BIOPSY  1994   left neck   RADIAL ARTERY HARVEST  10/24/2011   Procedure: RADIAL ARTERY HARVEST;  Surgeon: Rexene Alberts, MD;  Location: [facility];  Service: Vascular;  Laterality: Left;   RIGHT/LEFT HEART CATH AND CORONARY/GRAFT ANGIOGRAPHY N/A 02/15/2021   Procedure: RIGHT/LEFT HEART CATH AND CORONARY/GRAFT ANGIOGRAPHY;  Surgeon: Nelva Bush, MD;  Location: [facility];  Service: Cardiovascular;  Laterality: N/A;    Medical History: Past Medical History:  Diagnosis Date   Coronary artery disease    a. 10/2011 Cath: LM 85, LAD 20p, D1 20, LCX 20/61m, OM1 85, RCA 60ost, 52m, 50d; b. 10/2011 CABG x 3: LIMA->LAD, RIMA->dRCA, L Radial->OM2; c. 08/2015 MV: EF 33%, apical ant, apical septal, apical infarct, no ischemia.   Family history of coronary artery disease 10/23/2011   HFrEF (heart failure with reduced ejection fraction) ([place])    a. 02/2017 cMRI: EF 43%, mod MR, minimal LGE, nl RV size/fxn; b. 04/2019 Echo: EF 25-30%, glob HK, gr2 DD, mod reduced RV fxn, triv MR/MR, Ao root 43mm.   Hodgkin's lymphoma ([place]) 1994   neck/midchest area with radiation   Hyperlipidemia 10/23/2011   Hypertension    Ischemic cardiomyopathy    a. 08/2016 Echo: EF 20-25%; b. 02/2017 cMRI: EF 43% (no indication for ICD based on this finding); c. 04/2019 Echo: EF 25-30%.   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   LIMA to LAD, RIMA to RCA, LRA to OM2   S/P radiation therapy 10/21/1992   Radiation therapy to chest and neck for Hodgkin's lymphoma   Unstable angina ([place]) 10/22/2011    Family History: Non contributory to the present illness  Social History: Social History   Socioeconomic History   Marital status: Married    Spouse name: Not on file   Number of children: Not on file   Years of education: Not on file   Highest education level: Not on file  Occupational History   Not on file   Tobacco Use   Smoking status: Former    Packs/day: 0.50    Types: Cigarettes    Quit date: 10/21/1996    Years since quitting: 24.9   Smokeless tobacco: Former    Types: Snuff    Quit date: 04/05/2019  Vaping Use   Vaping Use: Never used  Substance and Sexual Activity   Alcohol use: No   Drug use: No   Sexual activity: Yes  Other Topics Concern   Not on file  Social History Narrative   Not on file   Social Determinants of Health   Financial Resource Strain: Low Risk  (04/18/2021)   Overall Financial Resource Strain (CARDIA)    Difficulty of Paying Living Expenses: Not hard at all  Food Insecurity: No Food Insecurity (04/18/2021)   Hunger Vital Sign    Worried About Running Out of Food in the Last Year: Never true    Ran Out of Food in the Last  Year: Never true  Transportation Needs: No Transportation Needs (04/18/2021)   PRAPARE - Hydrologist (Medical): No    Lack of Transportation (Non-Medical): No  Physical Activity: Not on file  Stress: Not on file  Social Connections: Not on file  Intimate Partner Violence: Not on file    Vital Signs: Blood pressure (!) 138/101, pulse 73, resp. rate 16, height $RemoveBe'5\' 8"'tphLigXqU$  (1.727 m), weight 218 lb 8 oz (99.1 kg), SpO2 99 %. Body mass index is 33.22 kg/m.   Examination: General Appearance: The patient is well-developed, well-nourished, and in no distress. Neck Circumference:  38 cm Skin: Gross inspection of skin unremarkable. Head: normocephalic, no gross deformities. Eyes: no gross deformities noted. ENT: ears appear grossly normal Neurologic: Alert and oriented. No involuntary movements.    STOP BANG RISK ASSESSMENT S (snore) Have you been told that you snore?     YES   T (tired) Are you often tired, fatigued, or sleepy during the day?   YES  O (obstruction) Do you stop breathing, choke, or gasp during sleep? YES   P (pressure) Do you have or are you being treated for high blood pressure? YES    B (BMI) Is your body index greater than 35 kg/m? NO   A (age) Are you 83 years old or older? YES   N (neck) Do you have a neck circumference greater than 16 inches?   NO   G (gender) Are you a male? YES   TOTAL STOP/BANG "YES" ANSWERS 6                                                               A STOP-Bang score of 2 or less is considered low risk, and a score of 5 or more is high risk for having either moderate or severe OSA. For people who score 3 or 4, doctors may need to perform further assessment to determine how likely they are to have OSA.         EPWORTH SLEEPINESS SCALE:  Scale:  (0)= no chance of dozing; (1)= slight chance of dozing; (2)= moderate chance of dozing; (3)= high chance of dozing  Chance  Situtation    Sitting and reading: 2    Watching TV: 2    Sitting Inactive in public: 0    As a passenger in car: 1      Lying down to rest: 2    Sitting and talking: 0    Sitting quielty after lunch: 2    In a car, stopped in traffic: 0   TOTAL SCORE:   9 out of 24    SLEEP STUDIES:  HST (06/26/21)  AHI 21.2, SPO2  86%, APAP 5-20 cmh20   LABS: Recent Results (from the past 2160 hour(s))  Basic metabolic panel     Status: None   Collection Time: 07/03/21  1:20 PM  Result Value Ref Range   Glucose 73 70 - 99 mg/dL   BUN 15 6 - 24 mg/dL   Creatinine, Ser 1.25 0.76 - 1.27 mg/dL   eGFR 67 >59 mL/min/1.73   BUN/Creatinine Ratio 12 9 - 20   Sodium 138 134 - 144 mmol/L   Potassium 4.4 3.5 - 5.2 mmol/L   Chloride 102 96 -  106 mmol/L   CO2 27 20 - 29 mmol/L   Calcium 10.1 8.7 - 10.2 mg/dL  CBC     Status: None   Collection Time: 07/03/21  1:20 PM  Result Value Ref Range   WBC 7.6 3.4 - 10.8 x10E3/uL   RBC 5.43 4.14 - 5.80 x10E6/uL   Hemoglobin 16.3 13.0 - 17.7 g/dL   Hematocrit 01.3 27.2 - 51.0 %   MCV 88 79 - 97 fL   MCH 30.0 26.6 - 33.0 pg   MCHC 34.3 31.5 - 35.7 g/dL   RDW 05.0 84.6 - 17.8 %   Platelets 202 150 - 450 x10E3/uL  CUP  PACEART INCLINIC DEVICE CHECK     Status: None   Collection Time: 07/24/21 11:22 AM  Result Value Ref Range   Date Time Interrogation Session [id number]    Pulse Generator Manufacturer [employer]    Pulse Gen Model 3357-40C Unify Assura    Pulse Gen Serial Number [id number]    Clinic Name [facility]    Implantable Pulse Generator Type Cardiac Resynch Therapy Defibulator    Implantable Pulse Generator Implant Date 38151655    Implantable Lead Manufacturer [facility]    Implantable Lead Model 3830 SelectSecure    Implantable Lead Serial Number [id number]    Implantable Lead Implant Date 71322738    Implantable Lead Location Detail 1 UNKNOWN    Implantable Lead Location [id number]    Implantable Lead Manufacturer [facility]    Implantable Lead Model 7122 Durata    Implantable Lead Serial Number [id number]    Implantable Lead Implant Date 92601815    Implantable Lead Location Detail 1 UNKNOWN    Implantable Lead Location [id number]    Implantable Lead Manufacturer [facility]    Implantable Lead Model LPA1200M Tendril MRI    Implantable Lead Serial Number [id number]    Implantable Lead Implant Date 19041431    Implantable Lead Location Detail 1 UNKNOWN    Implantable Lead Location [id number]    Lead Channel Setting Sensing Sensitivity 0.5 mV   Lead Channel Setting Pacing Amplitude 3.5 V   Lead Channel Setting Pacing Pulse Width 0.05 ms   Lead Channel Setting Pacing Amplitude 0.25 V   Lead Channel Setting Pacing Pulse Width 0.5 ms   Lead Channel Setting Pacing Amplitude 3.5 V   Lead Channel Setting Pacing Capture Mode Fixed Pacing    Lead Channel Impedance Value 512.5 ohm   Lead Channel Sensing Intrinsic Amplitude 5.0 mV   Lead Channel Pacing Threshold Amplitude 0.5 V   Lead Channel Pacing Threshold Pulse Width 0.5 ms   Lead Channel Pacing Threshold Amplitude 0.5 V   Lead Channel Pacing Threshold Pulse Width 0.5 ms   Lead Channel Impedance Value 662.5 ohm   Lead Channel Sensing Intrinsic Amplitude 11.9 mV   Lead  Channel Pacing Threshold Amplitude 0.5 V   Lead Channel Pacing Threshold Pulse Width 0.05 ms   Lead Channel Pacing Threshold Amplitude 0.5 V   Lead Channel Pacing Threshold Pulse Width 0.05 ms   HighPow Impedance 64.125    Lead Channel Impedance Value 675.0 ohm   Lead Channel Pacing Threshold Amplitude 0.5 V   Lead Channel Pacing Threshold Pulse Width 0.5 ms   Lead Channel Pacing Threshold Amplitude 0.5 V   Lead Channel Pacing Threshold Pulse Width 0.5 ms   Battery Remaining Longevity 72 mo   Brady Statistic RA Percent Paced 2.6 %   Brady Statistic RV Percent Paced 99.95 %   Eval Rhythm AS/VS 75  Basic Metabolic Panel (BMET)     Status: Abnormal   Collection Time: 08/05/21 11:02 AM  Result Value Ref Range   Sodium 138 135 - 145 mmol/L   Potassium 4.3 3.5 - 5.1 mmol/L   Chloride 106 98 - 111 mmol/L   CO2 26 22 - 32 mmol/L   Glucose, Bld 99 70 - 99 mg/dL    Comment: Glucose reference range applies only to samples taken after fasting for at least 8 hours.   BUN 15 6 - 20 mg/dL   Creatinine, Ser 1.26 (H) 0.61 - 1.24 mg/dL   Calcium 9.6 8.9 - 10.3 mg/dL   GFR, Estimated >60 >60 mL/min    Comment: (NOTE) Calculated using the CKD-EPI Creatinine Equation (2021)    Anion gap 6 5 - 15    Comment: Performed at [facility] [address] [place], [place] [address]  Basic Metabolic Panel (BMET)     Status: Abnormal   Collection Time: 08/14/21  7:58 AM  Result Value Ref Range   Sodium 140 135 - 145 mmol/L   Potassium 5.1 3.5 - 5.1 mmol/L   Chloride 109 98 - 111 mmol/L   CO2 27 22 - 32 mmol/L   Glucose, Bld 100 (H) 70 - 99 mg/dL    Comment: Glucose reference range applies only to samples taken after fasting for at least 8 hours.   BUN 15 6 - 20 mg/dL   Creatinine, Ser 1.34 (H) 0.61 - 1.24 mg/dL   Calcium 9.4 8.9 - 10.3 mg/dL   GFR, Estimated >60 >60 mL/min    Comment: (NOTE) Calculated using the CKD-EPI Creatinine Equation (2021)    Anion gap 4 (L) 5 - 15    Comment:  Performed at [facility], [address] [place], [place] [address]    Radiology: No results found.  No results found.  No results found.    Assessment and Plan: Patient Active Problem List   Diagnosis Date Noted   Hodgkin's lymphoma ([place]) 09/30/2021   Left ventricular dysfunction 09/30/2021   Hyperlipidemia, mixed 09/30/2021   CAD (coronary artery disease) 02/16/2021   Essential hypertension 02/16/2021   Chronic HFrEF (heart failure with reduced ejection fraction) ([place]) 02/15/2021   Nonrheumatic mitral valve regurgitation 03/10/2019   Cardiomyopathy, ischemic 10/10/2015   Shortness of breath 98/33/8250   Chronic systolic CHF (congestive heart failure), NYHA class 2 ([place]) 04/11/2015   Severe mitral insufficiency 09/04/2014   S/P CABG x 3 10/24/2011   Hyperlipidemia 10/23/2011   Family history of coronary artery disease 10/23/2011   Left main coronary artery disease 10/22/2011   Unstable angina ([place]) 10/22/2011   S/P radiation therapy 10/21/1992   1. OSA (obstructive sleep apnea) PLAN OSA:   Patient evaluation suggests high risk of sleep disordered breathing due to witnessed apnea, snoring, previous positive Home sleep study June 2023, morning headaches, daytime somnolence.  Patient has comorbid cardiovascular risk factors including: hypertension, heart failure/cardiomyopathy, mitral insufficiency, angina, coronary artery disease which could be exacerbated by pathologic sleep-disordered breathing.  Suggest: home study to assess the patient's sleep disordered breathing. The patient was also counselled on weight loss to optimize sleep health.  2. Cardiomyopathy, ischemic Has ICD, continues to follow closely with cardiologist.   3. Essential hypertension Hypertension Counseling:   The following hypertensive lifestyle modification were recommended and discussed:  1. Limiting alcohol intake to less than 1 oz/day of ethanol:(24 oz of beer or 8 oz of wine or 2 oz of  100-proof whiskey). 2.  Take baby ASA 81 mg daily. 3. Importance of regular aerobic exercise and losing weight. 4. Reduce dietary saturated fat and cholesterol intake for overall cardiovascular health. 5. Maintaining adequate dietary potassium, calcium, and magnesium intake. 6. Regular monitoring of the blood pressure. 7. Reduce sodium intake to less than 100 mmol/day (less than 2.3 gm of sodium or less than 6 gm of sodium choride)    4. Obesity (BMI 30-39.9) Obesity Counseling: Had a lengthy discussion regarding patients BMI and weight issues. Patient was instructed on portion control as well as increased activity. Also discussed caloric restrictions with trying to maintain intake less than 2000 Kcal. Discussions were made in accordance with the 5As of weight management. Simple actions such as not eating late and if able to, taking a walk is suggested.      General Counseling: I have discussed the findings of the evaluation and examination with [facility].  I have also discussed any further diagnostic evaluation thatmay be needed or ordered today. Akiem verbalizes understanding of the findings of todays visit. We also reviewed his medications today and discussed drug interactions and side effects including but not limited excessive drowsiness and altered mental states. We also discussed that there is always a risk not just to him but also people around him. he has been encouraged to call the office with any questions or concerns that should arise related to todays visit.  No orders of the defined types were placed in this encounter.       I have personally obtained a history, evaluated the patient, evaluated pertinent data, formulated the assessment and plan and placed orders.   This patient was seen today by Tressie Ellis, PA-C in collaboration with Dr. Devona Konig.   Allyne Gee, MD [facility] Diplomate ABMS Pulmonary and Critical Care Medicine Sleep medicine

## 2021-10-08 ENCOUNTER — Encounter: Payer: Self-pay | Admitting: Internal Medicine

## 2021-10-10 ENCOUNTER — Encounter: Payer: Self-pay | Admitting: Internal Medicine

## 2021-10-10 ENCOUNTER — Ambulatory Visit: Payer: Managed Care, Other (non HMO) | Attending: Internal Medicine | Admitting: Internal Medicine

## 2021-10-10 VITALS — BP 104/68 | HR 81 | Ht 68.0 in | Wt 218.0 lb

## 2021-10-10 DIAGNOSIS — I5022 Chronic systolic (congestive) heart failure: Secondary | ICD-10-CM | POA: Diagnosis not present

## 2021-10-10 DIAGNOSIS — I255 Ischemic cardiomyopathy: Secondary | ICD-10-CM | POA: Diagnosis not present

## 2021-10-10 DIAGNOSIS — Z95810 Presence of automatic (implantable) cardiac defibrillator: Secondary | ICD-10-CM

## 2021-10-10 DIAGNOSIS — I447 Left bundle-branch block, unspecified: Secondary | ICD-10-CM | POA: Diagnosis not present

## 2021-10-10 MED ORDER — BISOPROLOL FUMARATE 5 MG PO TABS: 2.5000 mg | ORAL_TABLET | Freq: Every day | ORAL | 3 refills | Status: DC

## 2021-10-10 NOTE — Patient Instructions (Addendum)
Medication Instructions:  Your physician recommends that you continue on your current medications as directed. Please refer to the Current Medication list given to you today.  ** Stop Carvedilol  ** Begin Bisoprolol '5mg'$  - 0.5 tablet (2.'5mg'$ ) by mouth daily  *If you need a refill on your cardiac medications before your next appointment, please call your pharmacy*   Lab Work: None ordered.  If you have labs (blood work) drawn today and your tests are completely normal, you will receive your results only by: [place] (if you have MyChart) OR A paper copy in the mail If you have any lab test that is abnormal or we need to change your treatment, we will call you to review the results.   Testing/Procedures: None ordered.    Follow-Up: At [facility], you and your health needs are our priority.  As part of our continuing mission to provide you with exceptional heart care, we have created designated Provider Care Teams.  These Care Teams include your primary Cardiologist (physician) and Advanced Practice Providers (APPs -  Physician Assistants and Nurse Practitioners) who all work together to provide you with the care you need, when you need it.  We recommend signing up for the patient portal called "MyChart".  Sign up information is provided on this After Visit Summary.  MyChart is used to connect with patients for Virtual Visits (Telemedicine).  Patients are able to view lab/test results, encounter notes, upcoming appointments, etc.  Non-urgent messages can be sent to your provider as well.   To learn more about what you can do with MyChart, go to [email or web address].    Your next appointment:   9 months with Dr Caryl Comes  Important Information About Sugar

## 2021-10-10 NOTE — Progress Notes (Signed)
Patient Care Team: Tracie Harrier, MD as PCP - General (Internal Medicine) Minna Merritts, MD as PCP - Cardiology (Cardiology) Minna Merritts, MD as Consulting Physician (Cardiology)   HPI  Willie [name] is a 56 y.o. male seen following ICD-CRT implantation 7/23 for left bundle branch block in the setting of persistent left ventricular dysfunction in the setting of mixed ischemic/nonischemic cardiomyopathy  LV lead implantation was attempted--but while the orifice was able to be cannulated, there are 2 valves in the posterior proximal portion and a very serpentine section past which I was unable to pass the wire.  No significant improvement, no change in dyspnea.  Still fatigued.   DATE TEST EF    2/19 cMRI 43%    2/23 Echo   20-25 %    1/23 LHC   RCA stent  5/23 Echo  25-30% RV dysfunction-mild    Date Cr K Hgb  6/23 1.04 4.3 16.5 (2/23)             Records and Results Reviewed   Past Medical History:  Diagnosis Date   Coronary artery disease    a. 10/2011 Cath: LM 85, LAD 20p, D1 20, LCX 20/34m OM1 85, RCA 60ost, 229m50d; b. 10/2011 CABG x 3: LIMA->LAD, RIMA->dRCA, L Radial->OM2; c. 08/2015 MV: EF 33%, apical ant, apical septal, apical infarct, no ischemia.   Family history of coronary artery disease 10/23/2011   HFrEF (heart failure with reduced ejection fraction) ([place]   a. 02/2017 cMRI: EF 43%, mod MR, minimal LGE, nl RV size/fxn; b. 04/2019 Echo: EF 25-30%, glob HK, gr2 DD, mod reduced RV fxn, triv MR/MR, Ao root 3923m  Hodgkin's lymphoma ([place]   neck/midchest area with radiation   Hyperlipidemia 10/23/2011   Hypertension    Ischemic cardiomyopathy    a. 08/2016 Echo: EF 20-25%; b. 02/2017 cMRI: EF 43% (no indication for ICD based on this finding); c. 04/2019 Echo: EF 25-30%.   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   LIMA to LAD, RIMA to RCA, LRA to OM2   S/P radiation therapy 10/21/1992   Radiation  therapy to chest and neck for Hodgkin's lymphoma   Unstable angina ([place]0/16/2013    Past Surgical History:  Procedure Laterality Date   BIV ICD INSERTION CRT-D N/A 07/08/2021   Procedure: BIV ICD INSERTION CRT-D;  Surgeon: KleDeboraha SprangD;  Location: [facility];  Service: Cardiovascular;  Laterality: N/A;   CARDIAC CATHETERIZATION     CARPAL TUNNEL RELEASE     CORONARY ARTERY BYPASS GRAFT  10/24/2011   Procedure: CORONARY ARTERY BYPASS GRAFTING (CABG);  Surgeon: ClaRexene AlbertsD;  [facility];  Laterality: N/A;  Coronary Artery Bypass Grafting X 3 Using Left Internal  Mammary Artery, Right Internal Mammary Artery and Right Radial Artery   CORONARY STENT INTERVENTION N/A 02/15/2021   Procedure: CORONARY STENT INTERVENTION;  Surgeon: EndNelva BushD;  [facility];  Service: Cardiovascular;  Laterality: N/A;   LYMPH NODE BIOPSY  1994   left neck   RADIAL ARTERY HARVEST  10/24/2011   Procedure: RADIAL ARTERY HARVEST;  Surgeon: ClaRexene AlbertsD;  [facility];  Laterality: Left;   RIGHT/LEFT HEART CATH AND CORONARY/GRAFT ANGIOGRAPHY N/A 02/15/2021   Procedure: RIGHT/LEFT HEART CATH AND CORONARY/GRAFT ANGIOGRAPHY;  Surgeon: EndNelva BushD;  [facility];  Service: Cardiovascular;  Laterality: N/A;    Current Meds  Medication Sig   aspirin EC 81 MG tablet Take 81 mg by mouth every evening.   carvedilol (COREG) 6.25 MG tablet Take 1 tablet (6.25 mg total) by mouth 2 (two) times daily.   clopidogrel (PLAVIX) 75 MG tablet Take 1 tablet (75 mg total) by mouth daily with breakfast.   colchicine 0.6 MG tablet Take 0.6 mg by mouth daily as needed (GOUT FLARES).   dapagliflozin propanediol (FARXIGA) 10 MG TABS tablet Take 1 tablet (10 mg total) by mouth daily with breakfast.   eplerenone (INSPRA) 25 MG tablet Take 2 tablets (50 mg total) by mouth at bedtime.   Evolocumab (REPATHA  SURECLICK) 175 MG/ML SOAJ Inject 1 pen. into the skin every 14 (fourteen) days.   furosemide (LASIX) 40 MG tablet Take 0.5 tablets (20 mg total) by mouth daily as needed for fluid.   icosapent Ethyl (VASCEPA) 1 g capsule Take 2 capsules (2 g total) by mouth 2 (two) times daily.   pantoprazole (PROTONIX) 40 MG tablet Take 40 mg by mouth in the morning.   rosuvastatin (CRESTOR) 40 MG tablet Take 1 tablet (40 mg total) by mouth daily.   sacubitril-valsartan (ENTRESTO) 24-26 MG Take 1 tablet by mouth 2 (two) times daily.   senna (SENOKOT) 8.6 MG tablet Take 4 tablets by mouth daily with supper.   sodium zirconium cyclosilicate (LOKELMA) 10 g PACK packet Take 10 g by mouth daily.   vitamin B-12 (CYANOCOBALAMIN) 1000 MCG tablet Take 1 tablet (1,000 mcg total) by mouth daily.    Allergies  Allergen Reactions   Spironolactone Other (See Comments)    Caused Chest swelling/knots around breast area.        Review of Systems negative except from HPI and PMH  Physical Exam BP 104/68   Pulse 81   Ht '5\' 8"'$  (1.727 m)   Wt 218 lb (98.9 kg)   SpO2 96%   BMI 33.15 kg/m  Well developed and well nourished in no acute distress HENT normal E scleral and icterus clear Neck Supple JVP flat; carotids brisk and full Clear to ausculation Regular rate and rhythm, no murmurs gallops or rub Soft with active bowel sounds No clubbing cyanosis  Edema Alert and oriented, grossly normal motor and sensory function Skin Warm and Dry  ECG sinus with P-synchronous/ AV  pacing  QRSd is 118 ms  Preimplant QRSd was 154 ms  CrCl cannot be calculated (Patient's most recent lab result is older than the maximum 21 days allowed.).   Assessment and  Plan Nonischemic and ischemic cardiomyopathy CABG and recent interval stenting   History of Hodgkin's disease with mantle radiation and chemotherapy  ICD-Saint Jude-left bundle branch area pacing lead   Congestive heart failure left greater than right class  III-B   Left bundle branch block   Lightheadedness   Fatigue   Patient is not significantly improved not withstanding remarkable narrowing of his QRSd from 150s to 118 ms.  Not withstanding, there may be interval improvement in LV function and he is scheduled to have an ultrasound with Dr. Cherylann Ratel next month.  I have also reprogrammed his AV delay to allow for more ventricular filling extending from 120--140 ms.  With his fatigue, we will undertake a carvedilol exchange trial and put him on bisoprolol 2.5 mg between now and when he sees Dr. Cherylann Ratel.  Continue his eplerenone  No chest pain.  Continue aspirin and Plavix.  Current medicines are reviewed at length with the patient today .  The patient does not  have concerns regarding medicines.

## 2021-10-11 ENCOUNTER — Ambulatory Visit (INDEPENDENT_AMBULATORY_CARE_PROVIDER_SITE_OTHER): Payer: Managed Care, Other (non HMO)

## 2021-10-11 DIAGNOSIS — I255 Ischemic cardiomyopathy: Secondary | ICD-10-CM

## 2021-10-11 DIAGNOSIS — Z95810 Presence of automatic (implantable) cardiac defibrillator: Secondary | ICD-10-CM | POA: Diagnosis not present

## 2021-10-11 LAB — CUP PACEART REMOTE DEVICE CHECK
Battery Remaining Longevity: 85 mo
Battery Remaining Percentage: 90 %
Battery Voltage: 3.11 V
Brady Statistic AP VP Percent: 1 %
Brady Statistic AP VS Percent: 0 %
Brady Statistic AS VP Percent: 99 %
Brady Statistic AS VS Percent: 1 %
Brady Statistic RA Percent Paced: 1 %
Date Time Interrogation Session: 20231006020017
HighPow Impedance: 69 Ohm
HighPow Impedance: 69 Ohm
Implantable Lead Implant Date: 20230703
Implantable Lead Implant Date: 20230703
Implantable Lead Implant Date: 20230703
Implantable Lead Location: 753858
Implantable Lead Location: 753859
Implantable Lead Location: 753860
Implantable Lead Model: 3830
Implantable Lead Model: 7122
Implantable Pulse Generator Implant Date: 20230703
Lead Channel Impedance Value: 430 Ohm
Lead Channel Impedance Value: 640 Ohm
Lead Channel Impedance Value: 760 Ohm
Lead Channel Pacing Threshold Amplitude: 0.25 V
Lead Channel Pacing Threshold Amplitude: 0.25 V
Lead Channel Pacing Threshold Amplitude: 0.5 V
Lead Channel Pacing Threshold Pulse Width: 0.05 ms
Lead Channel Pacing Threshold Pulse Width: 0.5 ms
Lead Channel Pacing Threshold Pulse Width: 0.5 ms
Lead Channel Sensing Intrinsic Amplitude: 11.9 mV
Lead Channel Sensing Intrinsic Amplitude: 4.5 mV
Lead Channel Setting Pacing Amplitude: 0.25 V
Lead Channel Setting Pacing Amplitude: 2 V
Lead Channel Setting Pacing Amplitude: 2 V
Lead Channel Setting Pacing Pulse Width: 0.05 ms
Lead Channel Setting Pacing Pulse Width: 0.5 ms
Lead Channel Setting Sensing Sensitivity: 0.5 mV
Pulse Gen Serial Number: 8906747

## 2021-10-15 NOTE — Progress Notes (Signed)
Remote ICD transmission.   

## 2021-11-04 ENCOUNTER — Encounter (HOSPITAL_COMMUNITY): Payer: Self-pay | Admitting: Cardiology

## 2021-11-05 ENCOUNTER — Other Ambulatory Visit (HOSPITAL_COMMUNITY): Payer: Managed Care, Other (non HMO)

## 2021-11-05 ENCOUNTER — Encounter (HOSPITAL_COMMUNITY): Payer: Managed Care, Other (non HMO) | Admitting: Cardiology

## 2021-11-05 NOTE — Telephone Encounter (Signed)
Reached out to patient to inform him of his sleep study result and to let him know he has been recommended to have a cpap titration. Patient states he already has his cpap given to him from a [place] location.

## 2021-11-07 ENCOUNTER — Encounter (HOSPITAL_COMMUNITY): Payer: Self-pay | Admitting: Cardiology

## 2021-11-07 ENCOUNTER — Ambulatory Visit (HOSPITAL_COMMUNITY)
Admission: RE | Admit: 2021-11-07 | Discharge: 2021-11-07 | Disposition: A | Discharge status: Home / Self Care | Payer: Managed Care, Other (non HMO) | Source: Ambulatory Visit | Attending: Cardiology | Admitting: Cardiology

## 2021-11-07 ENCOUNTER — Encounter (HOSPITAL_COMMUNITY): Payer: Self-pay

## 2021-11-07 VITALS — BP 124/70 | HR 60 | Wt 222.8 lb

## 2021-11-07 DIAGNOSIS — Z7982 Long term (current) use of aspirin: Secondary | ICD-10-CM | POA: Insufficient documentation

## 2021-11-07 DIAGNOSIS — Z923 Personal history of irradiation: Secondary | ICD-10-CM | POA: Insufficient documentation

## 2021-11-07 DIAGNOSIS — E785 Hyperlipidemia, unspecified: Secondary | ICD-10-CM | POA: Diagnosis not present

## 2021-11-07 DIAGNOSIS — I5022 Chronic systolic (congestive) heart failure: Secondary | ICD-10-CM | POA: Diagnosis present

## 2021-11-07 DIAGNOSIS — Z955 Presence of coronary angioplasty implant and graft: Secondary | ICD-10-CM | POA: Insufficient documentation

## 2021-11-07 DIAGNOSIS — I77810 Thoracic aortic ectasia: Secondary | ICD-10-CM | POA: Diagnosis not present

## 2021-11-07 DIAGNOSIS — Z87891 Personal history of nicotine dependence: Secondary | ICD-10-CM | POA: Diagnosis not present

## 2021-11-07 DIAGNOSIS — I255 Ischemic cardiomyopathy: Secondary | ICD-10-CM | POA: Insufficient documentation

## 2021-11-07 DIAGNOSIS — Z951 Presence of aortocoronary bypass graft: Secondary | ICD-10-CM | POA: Insufficient documentation

## 2021-11-07 DIAGNOSIS — I2510 Atherosclerotic heart disease of native coronary artery without angina pectoris: Secondary | ICD-10-CM | POA: Diagnosis not present

## 2021-11-07 DIAGNOSIS — Z79899 Other long term (current) drug therapy: Secondary | ICD-10-CM | POA: Diagnosis not present

## 2021-11-07 DIAGNOSIS — Z8249 Family history of ischemic heart disease and other diseases of the circulatory system: Secondary | ICD-10-CM | POA: Diagnosis not present

## 2021-11-07 DIAGNOSIS — Z95810 Presence of automatic (implantable) cardiac defibrillator: Secondary | ICD-10-CM | POA: Diagnosis not present

## 2021-11-07 DIAGNOSIS — G4733 Obstructive sleep apnea (adult) (pediatric): Secondary | ICD-10-CM | POA: Diagnosis not present

## 2021-11-07 DIAGNOSIS — I358 Other nonrheumatic aortic valve disorders: Secondary | ICD-10-CM

## 2021-11-07 DIAGNOSIS — I5030 Unspecified diastolic (congestive) heart failure: Secondary | ICD-10-CM

## 2021-11-07 DIAGNOSIS — Z8616 Personal history of COVID-19: Secondary | ICD-10-CM | POA: Insufficient documentation

## 2021-11-07 DIAGNOSIS — Z9221 Personal history of antineoplastic chemotherapy: Secondary | ICD-10-CM | POA: Diagnosis not present

## 2021-11-07 DIAGNOSIS — Z7902 Long term (current) use of antithrombotics/antiplatelets: Secondary | ICD-10-CM | POA: Diagnosis not present

## 2021-11-07 DIAGNOSIS — Z8571 Personal history of Hodgkin lymphoma: Secondary | ICD-10-CM | POA: Diagnosis not present

## 2021-11-07 DIAGNOSIS — Z006 Encounter for examination for normal comparison and control in clinical research program: Secondary | ICD-10-CM

## 2021-11-07 LAB — BASIC METABOLIC PANEL
Anion gap: 7 (ref 5–15)
BUN: 11 mg/dL (ref 6–20)
CO2: 26 mmol/L (ref 22–32)
Calcium: 9.6 mg/dL (ref 8.9–10.3)
Chloride: 107 mmol/L (ref 98–111)
Creatinine, Ser: 1.16 mg/dL (ref 0.61–1.24)
GFR, Estimated: >60 mL/min (ref >60)
Glucose, Bld: 99 mg/dL (ref 70–99)
Potassium: 5.6 mmol/L — ABNORMAL HIGH (ref 3.5–5.1)
Sodium: 140 mmol/L (ref 135–145)

## 2021-11-07 LAB — ECHOCARDIOGRAM COMPLETE
AR max vel: 2.45 cm2
AV Area VTI: 2.44 cm2
AV Area mean vel: 2.19 cm2
AV Mean grad: 2 mmHg
AV Peak grad: 4.2 mmHg
Ao pk vel: 1.03 m/s
Area-P 1/2: 2.8 cm2
Calc EF: 36.3 %
MV M vel: 2.1 m/s
MV Peak grad: 17.6 mmHg
S' Lateral: 3.9 cm
Single Plane A2C EF: 37.8 %
Single Plane A4C EF: 36.2 %

## 2021-11-07 LAB — BRAIN NATRIURETIC PEPTIDE: B Natriuretic Peptide: 123.2 pg/mL — ABNORMAL HIGH (ref 0.0–100.0)

## 2021-11-07 MED ORDER — BISOPROLOL FUMARATE 5 MG PO TABS: 5.0000 mg | ORAL_TABLET | Freq: Every day | ORAL | 3 refills | Status: DC

## 2021-11-07 NOTE — Research (Addendum)
Detect HF Informed Consent    Subject Name: Willie [name]   Subject met inclusion and exclusion criteria.  The informed consent form, study requirements and expectations were reviewed with the subject and questions and concerns were addressed prior to the signing of the consent form.  The subject verbalized understanding of the trial requirements.  The subject agreed to participate in the Detect HF trial and signed the informed consent on 07 Nov 2021. The informed consent was obtained prior to performance of any protocol-specific procedures for the subject.  A copy of the signed informed consent was given to the subject and a copy was placed in the subject's medical record.    Debbora Presto, RN Clinical Research Coordinator   Date of last ECHO: 07 Nov 2021  Date of last ECG: 07 Nov 2021  KCCQ: See worksheet   First patient training and registration of Cordio app: Completed    Inclusion  YES NO   1- Age 58 or greater   _0  _1    2-Diagnosed with Symptomatic Chronic Heart Failure NYHA (II-IV a)  _2  _3    3-At least one of the following:      a. One ADHF hospitalization in the last 12 months  _4  _5    b. One unplanned IV/[place] diuretic administration in the last 6 months   _6   _7    c. Two unplanned IV/[place] diuretic administration in the last 12 months   _8  _9    d. NTProBNP >500 or BNP >150 at screening visit or in the last 30 days. *BNP>150 only for patients that are currently NOT being treated with Entresto*  _10  _11    4-Clinical stable HF according to investigator discretion   _12  _13    5-Willing to participate as evidenced by signing the written informed consent   _14  _15   6-Male or non-pregnant male patient (pre menopausal women will confirm verbally) _16  _17     Exclusion     1-Not able to read in [place] or Spanish  _18  _19    2-Unable to comply with daily use of the App eg, due to mental disorders (depression, dementia, etc.)  _20  _21    3-Has had a major  cardiovascular event ([place]) within 3 months prior to enrollment  _22  _23    4-Had a Cardiac Resynchronization Therapy Device (CRT) implanted or upgrading <1 month prior to screening visit  _24  _25    5-Has estimated Glomerular Filtration Rate (eGFR) <25m/min/1.72 meter square  _26  _27    6-Is likely to undergo heart transplantation/LVAD within 6 months of screening visit  _28  _29    7-Was treated for a significant COPD (GOLD 3 or 4 or need for home oxygen use) following a pulmonologist diagnosis and treatment   _30  _31    8-History of alcohol or substance abuse or of non-compliance (eg, medications, exams) within the last 12 months, according to investigator description   _32  _33    9-Life expectancy <12 months   _34  _35    10-Has evidence of active respiratory infection  _36  _37    11-Primary restrictive cardiomyopathy or constrictive pericarditis   _38  _39    12-Is currently enrolled in another non observational remote heart failure monitoring study   _40  _41   13-Has any speech disorder, eg, stuttering, apraxia, dysarthria, etc. _42  _43   14-Male who is pregnant or plans to become pregnant within the course of the study _44  _45   15-Is receiving regularly scheduled IV/[place] diuretics therapy as part of their drug regimen _46  _47   16-Has received a heart, lung transplant or mechanical circulatory support  _48  [  x]  17-Patient has CardioMEMS implanted or has another implanted device or other monitoring program (investigational or not) that is being used for home monitoring of fluid volume status _0  _1   18-Site/sponsor employee or family member of a site/sponsor employee  _2  _3     CARDIAC PHYSICAL EXAMINATION        _4 Not Done  Total JVP: 7 cm   (Normal = up to total of 7cm)   Peripheral Edema Grade: X? 0   ? 1   ? 2  ? 3 ? 4   PHYSICIANS DYSPNEA ASSESSMENT*                                 _5  Not Done  Dyspnea ?X Yes ? No  NYHA Class ? 1    X? 2     ? 3     ? 4  Speech Dyspnea ? Yes ?X No   Paroxysmal Nocturnal Dyspnea  ? Yes ? XNo  Orthopnea ? Yes ? XNo  Pulmonary Congestion (if yes, check severe under 'Medical Condition' below) ? Yes ? XNo  Pleural Fluid ? Right ? Left ? Both      X? N/A  Wheezing ? Yes ? XNo  Severity of medical condition (select 'severe' if pulmonary congestion is present): ? Mild   ?X Moderate ? Severe  Comments: __________________________________________________________________________________________________________________________________________________________________________________________________________________________________________

## 2021-11-07 NOTE — Patient Instructions (Signed)
INCREASE BISOPROLOL '5mg'$  daily.  Labs done today, your results will be available in MyChart, we will contact you for abnormal readings.  Your physician recommends that you schedule a follow-up appointment in: 3 months   If you have any questions or concerns before your next appointment please send [place] a message through [place] or call our office at [phone].    TO LEAVE A MESSAGE FOR THE NURSE SELECT OPTION 2, PLEASE LEAVE A MESSAGE INCLUDING: YOUR NAME DATE OF BIRTH CALL BACK NUMBER REASON FOR CALL**this is important as we prioritize the call backs  YOU WILL RECEIVE A CALL BACK THE SAME DAY AS LONG AS YOU CALL BEFORE 4:00 PM  At the [facility], you and your health needs are our priority. As part of our continuing mission to provide you with exceptional heart care, we have created designated Provider Care Teams. These Care Teams include your primary Cardiologist (physician) and Advanced Practice Providers (APPs- Physician Assistants and Nurse Practitioners) who all work together to provide you with the care you need, when you need it.   You may see any of the following providers on your designated Care Team at your next follow up: Dr Glori Bickers Dr Loralie Champagne Dr. Roxana Hires, NP Lyda Jester, [place] [facility] [place], [place] Forestine Na, NP Audry Riles, PharmD   Please be sure to bring in all your medications bottles to every appointment.

## 2021-11-07 NOTE — Progress Notes (Addendum)
PCP: Tracie Harrier, MD Cardiology: Dr. Rockey Situ HF Cardiology: Dr. Aundra Dubin  56 y.o. with history of CAD s/p CABG, ischemic cardiomyopathy, and prior Hodgkins lymphoma was referred by Dr. Rockey Situ for CHF clinic evaluation.  Patient had Hodgkins disease diagnosed in 1994 and was treated with chemotherapy and radiation.  A strong family history of CAD and also the chest wall radiation with Hodgkins treatment likely predisposed him to premature CAD.  He had CABG in 2013. Echo at the time of CABG showed EF 30%.  EF has been low since that time except for cMRI done in 2/19 which showed EF 43%.  Most recent echo in 2/23 showed EF 20-25%, normal RV.  He had worsening dyspnea as well as chest pain in 1/23 and 2/23.  Cath was done, showing 90% proximal-mid RCA stenosis with atretic RIMA-PDA.  This was treated with DES x 2.  RHC showed prominent RV failure.    Echo in 5/23 showed EF 25-30% with septal-lateral dyssynchrony and mild RV dysfunction. Patient had St Jude ICD with left bundle lead placed.  Echo was done today and reviewed, EF 35%, mild RV dysfunction.   He returns for followup of CHF.  Weight is up but he reports eating more.  He will start CPAP soon.  Currently doing well, feels better since device was reprogrammed at last visit with Dr. Caryl Comes.  No significant dyspnea walking on flat ground.  He gets short of breath carrying a heavy load.  No orthopnea/PND.  No chest pain.  No palpitations.     Labs (2/23): LDL 119, TGs 291, K 5.3 (stopped KCl), creatinine 1.18 Labs (5/23): K 5.3, creatinine 1.3, LDL 16, TGs 69 Labs (6/23): K 4.4, creatinine 1.25 Labs (8/23): K 5.1, creatinine 1.34  ECG (personally reviewed): NSR, left bundle pacing  St Jude device interrogation: >99% left bundle pacing, no AF/VT, stable thoracic impedance.   PMH: 1. Hodgkins disease: Treated with chemotherapy and chest radiation in 1994.  2. CAD: CABG x 3 in 2013 with LIMA-LAD, RIMA-RCA, free radial-OM2.  - LHC (2/23): 90%  proximal-mid RCA stenosis with atretic RIMA-RCA, patient had DES x 2 to proximal and mid RCA.  3. H/o COVID 4. Hyperlipidemia 5. Gout 6. Chronic systolic CHF: Ischemic cardiomyopathy.  He has a Research officer, political party ICD with left bundle lead.  - Echo (10/13): EF 30% - Echo (2018): EF 20-25%, moderate RV dysfunction.  - Cardiac MRI (2/19): EF 43%, minimal coronary pattern LGE.   - Echo (4/21): EF 20-25%, normal RV - Echo (2/23): EF 20-25%, normal RV - RHC (2/23): mean RA 18, PA 33/18 mean 23, PAPI < 1, CI 2.0 Fick/2.5 thermo.  - Echo (5/23): EF 25-30% with septal-lateral dyssynchrony and mild RV dysfunction. - Echo (11/23): EF 35%, mildly decreased RV systolic function.  7. OSA: Moderate.   Social History   Socioeconomic History   Marital status: Married    Spouse name: Not on file   Number of children: Not on file   Years of education: Not on file   Highest education level: Not on file  Occupational History   Not on file  Tobacco Use   Smoking status: Former    Packs/day: 0.50    Types: Cigarettes    Quit date: 10/21/1996    Years since quitting: 25.0   Smokeless tobacco: Former    Types: Snuff    Quit date: 04/05/2019  Vaping Use   Vaping Use: Never used  Substance and Sexual Activity   Alcohol use: No  Drug use: No   Sexual activity: Yes  Other Topics Concern   Not on file  Social History Narrative   Not on file   Social Determinants of Health   Financial Resource Strain: Low Risk  (04/18/2021)   Overall Financial Resource Strain (CARDIA)    Difficulty of Paying Living Expenses: Not hard at all  Food Insecurity: No Food Insecurity (04/18/2021)   Hunger Vital Sign    Worried About Running Out of Food in the Last Year: Never true    Ran Out of Food in the Last Year: Never true  Transportation Needs: No Transportation Needs (04/18/2021)   PRAPARE - Hydrologist (Medical): No    Lack of Transportation (Non-Medical): No  Physical Activity: Not on file   Stress: Not on file  Social Connections: Not on file  Intimate Partner Violence: Not on file   Family History  Problem Relation Age of Onset   Coronary artery disease Other    Heart disease Mother    Heart attack Father    ROS: All systems reviewed and negative except as per HPI.   Current Outpatient Medications  Medication Sig Dispense Refill   aspirin EC 81 MG tablet Take 81 mg by mouth every evening.     clopidogrel (PLAVIX) 75 MG tablet Take 1 tablet (75 mg total) by mouth daily with breakfast. 90 tablet 3   colchicine 0.6 MG tablet Take 0.6 mg by mouth daily as needed (GOUT FLARES).     dapagliflozin propanediol (FARXIGA) 10 MG TABS tablet Take 1 tablet (10 mg total) by mouth daily with breakfast. 90 tablet 3   eplerenone (INSPRA) 25 MG tablet Take 2 tablets (50 mg total) by mouth at bedtime. 60 tablet 11   Evolocumab (REPATHA SURECLICK) 263 MG/ML SOAJ Inject 1 pen. into the skin every 14 (fourteen) days. 6 mL 3   furosemide (LASIX) 40 MG tablet Take 0.5 tablets (20 mg total) by mouth daily as needed for fluid. 30 tablet 3   icosapent Ethyl (VASCEPA) 1 g capsule Take 2 capsules (2 g total) by mouth 2 (two) times daily. 120 capsule 11   pantoprazole (PROTONIX) 40 MG tablet Take 40 mg by mouth in the morning.     rosuvastatin (CRESTOR) 40 MG tablet Take 1 tablet (40 mg total) by mouth daily. 90 tablet 3   sacubitril-valsartan (ENTRESTO) 24-26 MG Take 1 tablet by mouth 2 (two) times daily. 180 tablet 3   senna (SENOKOT) 8.6 MG tablet Take 4 tablets by mouth daily with supper.     sodium zirconium cyclosilicate (LOKELMA) 10 g PACK packet Take 10 g by mouth daily. 90 packet 3   vitamin B-12 (CYANOCOBALAMIN) 1000 MCG tablet Take 1 tablet (1,000 mcg total) by mouth daily. 30 tablet 1   bisoprolol (ZEBETA) 5 MG tablet Take 1 tablet (5 mg total) by mouth daily. 90 tablet 3   No current facility-administered medications for this encounter.   BP 124/70   Pulse 60   Wt 101.1 kg (222 lb  12.8 oz)   SpO2 99%   BMI 33.88 kg/m  General: NAD Neck: No JVD, no thyromegaly or thyroid nodule.  Lungs: Clear to auscultation bilaterally with normal respiratory effort. CV: Nondisplaced PMI.  Heart regular S1/S2, no S3/S4, no murmur.  No peripheral edema.  No carotid bruit.  Normal pedal pulses.  Abdomen: Soft, nontender, no hepatosplenomegaly, no distention.  Skin: Intact without lesions or rashes.  Neurologic: Alert and oriented x 3.  Psych: Normal affect. Extremities: No clubbing or cyanosis.  HEENT: Normal.   Assessment/Plan: 1. CAD: s/p CABG in 2013 and DES x 2 to native RCA in 2/23 with atretic RIMA-PDA noted. No further chest pain since PCI.  - Continue ASA 81 and Plavix 75.  - Continue Crestor and Zetia, good lipids in 5/23. 2. Hyperlipidemia: He is now on Repatha, Crestor, Vascepa. Good lipids in 5/23.  3. Chronic systolic CHF: Ischemic cardiomyopathy. Echo in 2/23 with EF 20-25%, normal RV.  [place] in 2/23 showed prominent RV failure with PAPI < 1.  Echo in 5/23 showed EF 25-30% with septal-lateral dyssynchrony and mild RV dysfunction.  St Jude ICD with left bundle lead was placed.  Echo today showed EF slightly higher at 35%.  NYHA class II, not volume overloaded.   - Breast pain with spironolactone, now on eplerenone 50 mg daily.  K controlled by [facility], currently at 10 g daily.  BMET/BNP today.  - Increase bisoprolol to 5 mg daily.    - Continue Entresto 24/26 bid, he was lightheaded when we tried to increase.  - Continue Farxiga 10 mg daily.  4. OSA: Waiting for CPAP.   Followup in 3 months with APP.   Loralie Champagne 11/07/2021  CARDIAC PHYSICAL EXAMINATION        '[]'$ Not Done  Total JVP: 7 cm   (Normal = up to total of 7cm)   Peripheral Edema Grade: x 0   ? 1   ? 2  ? 3 ? 4   PHYSICIANS DYSPNEA ASSESSMENT*                                 '[]'$  Not Done  Dyspnea x Yes ? No  NYHA Class ? 1    x 2     ? 3     ? 4  Speech Dyspnea ? Yes x No  Paroxysmal Nocturnal Dyspnea   ? Yes x No  Orthopnea ? Yes X No  Pulmonary Congestion (if yes, check severe under 'Medical Condition' below) ? Yes x No  Pleural Fluid ? Right ? Left ? Both      x N/A  Wheezing ? Yes x No  Severity of medical condition (select 'severe' if pulmonary congestion is present): ? Mild   x Moderate ? Severe  Comments: __________________________________________________________________________________________________________________________________________________________________________________________________________________________________________

## 2021-11-07 NOTE — Progress Notes (Signed)
*  PRELIMINARY RESULTS* Echocardiogram 2D Echocardiogram has been performed.  Lana Fish 11/07/2021, 10:29 AM

## 2021-11-11 ENCOUNTER — Encounter (HOSPITAL_COMMUNITY): Payer: Self-pay

## 2021-11-14 ENCOUNTER — Encounter (HOSPITAL_COMMUNITY): Payer: Self-pay

## 2021-11-21 ENCOUNTER — Encounter: Payer: Managed Care, Other (non HMO) | Admitting: *Deleted

## 2021-11-21 DIAGNOSIS — Z006 Encounter for examination for normal comparison and control in clinical research program: Secondary | ICD-10-CM

## 2021-11-21 NOTE — Research (Signed)
Lab draw for study

## 2021-11-23 LAB — CBC
Hematocrit: 47.7 % (ref 37.5–51.0)
Hemoglobin: 16 g/dL (ref 13.0–17.7)
MCH: 29.5 pg (ref 26.6–33.0)
MCHC: 33.5 g/dL (ref 31.5–35.7)
MCV: 88 fL (ref 79–97)
Platelets: 190 10*3/uL (ref 150–450)
RBC: 5.43 x10E6/uL (ref 4.14–5.80)
RDW: 13.6 % (ref 11.6–15.4)
WBC: 7 10*3/uL (ref 3.4–10.8)

## 2021-11-23 LAB — PRO B NATRIURETIC PEPTIDE: NT-Pro BNP: 489 pg/mL — ABNORMAL HIGH (ref 0–210)

## 2021-11-25 NOTE — Progress Notes (Addendum)
Screening labs for Detect HF, pt will be screen fail d/t NT pro <500. Will let pt know.  UNSCHEDULED VISIT   Date of Visit: 16/Nov/2023  Reason for Unscheduled Visit: ? Heart Failure Event ? Adverse Event (non-Heart Failure) ? Other, specify: LAB DRAW  Visit Type: X  Clinic ? Phone   NYHA  NYHA Class: ? 1    x  2     ? 3     ? 4   CONCOMITANT MEDICATIONS  Have there been any changes to the subject's medications since last visit?  If yes, please update Concomitant Medications Log. ? Yes X No   ADVERSE EVENTS  Have any new adverse events (AEs) occurred since last visit?  If yes, please update Adverse Events Log. ? Yes X  No

## 2021-11-27 DIAGNOSIS — Z006 Encounter for examination for normal comparison and control in clinical research program: Secondary | ICD-10-CM

## 2021-11-27 NOTE — Research (Signed)
RUN-IN CORE QUALIFICATION  Date of Enrollment (per email): 18/Nov/2023  Type of Run-In Evaluation: X Initial               ? Re-Screen   CORE ELIGIBILITY CONFIRMATION  During the run-in period, the subject had NO change in their HF clinical status (i.e., no signs or symptoms of worsening HF) and/or HF-related medications. X Correct/Yes  ? Incorrect/No  The subject has met a creation of baseline recordings: X Yes ? No  a. If No, will subject be re-screened? ? Yes ? No  b. If No, reason for screen failure: _________________________________________________________________________________________________________________________________________________________________________________________________________  Number of days in Run-In Period: 15  Will subject continue to CORE period? X Yes ? No (proceed to next question)  a. If No, has Run-In failure form been completed? ? Yes  ? No  Enrollment Authorized by Name:  Bryson Dames, RN_  Enrollment Authorized by Signature:      NYHA  NYHA Class: ? 1    X 2     ? 3     ? 4   USABILITY QUESTIONNAIRE  Has subject completed usability questionnaire? X Completed   ? Not Completed (explain):     ____________________________________________________ _____________________________________________________________    CONCOMITANT MEDICATIONS  Have there been any changes to the subject's medications since last visit?  If yes, please update Concomitant Medications Log. ? Yes X No   ADVERSE EVENTS  Have any new adverse events (AEs) occurred since last visit?  If yes, please update Adverse Events Log. ? Yes X No       Per inclusion criteria listed below for Detect HF study, pt meets inclusion for NTproBNP level at 489 with weight based adjustment. With BMI 33.8, NTproBNP level needs to be >320 for inclusion.

## 2021-12-31 ENCOUNTER — Encounter (HOSPITAL_COMMUNITY): Payer: Self-pay

## 2022-01-01 ENCOUNTER — Other Ambulatory Visit (HOSPITAL_COMMUNITY): Payer: Self-pay | Admitting: Cardiology

## 2022-01-01 DIAGNOSIS — I255 Ischemic cardiomyopathy: Secondary | ICD-10-CM

## 2022-01-10 ENCOUNTER — Ambulatory Visit (HOSPITAL_COMMUNITY)
Admission: RE | Admit: 2022-01-10 | Discharge: 2022-01-10 | Disposition: A | Discharge status: Home / Self Care | Payer: Managed Care, Other (non HMO) | Source: Ambulatory Visit | Attending: Cardiology | Admitting: Cardiology

## 2022-01-10 ENCOUNTER — Ambulatory Visit: Payer: Managed Care, Other (non HMO)

## 2022-01-10 DIAGNOSIS — I2510 Atherosclerotic heart disease of native coronary artery without angina pectoris: Secondary | ICD-10-CM

## 2022-01-10 DIAGNOSIS — Z951 Presence of aortocoronary bypass graft: Secondary | ICD-10-CM | POA: Diagnosis present

## 2022-01-10 DIAGNOSIS — E785 Hyperlipidemia, unspecified: Secondary | ICD-10-CM

## 2022-01-10 DIAGNOSIS — I255 Ischemic cardiomyopathy: Secondary | ICD-10-CM | POA: Diagnosis not present

## 2022-01-10 LAB — CUP PACEART REMOTE DEVICE CHECK
Battery Remaining Longevity: 83 mo
Battery Remaining Percentage: 87 %
Battery Voltage: 3.07 V
Brady Statistic AP VP Percent: 25 %
Brady Statistic AP VS Percent: 1 %
Brady Statistic AS VP Percent: 75 %
Brady Statistic AS VS Percent: 1 %
Brady Statistic RA Percent Paced: 25 %
Date Time Interrogation Session: 20240105020017
HighPow Impedance: 71 Ohm
HighPow Impedance: 71 Ohm
Implantable Lead Connection Status: 753985
Implantable Lead Connection Status: 753985
Implantable Lead Connection Status: 753985
Implantable Lead Implant Date: 20230703
Implantable Lead Implant Date: 20230703
Implantable Lead Implant Date: 20230703
Implantable Lead Location: 753858
Implantable Lead Location: 753859
Implantable Lead Location: 753860
Implantable Lead Model: 3830
Implantable Lead Model: 7122
Implantable Pulse Generator Implant Date: 20230703
Lead Channel Impedance Value: 410 Ohm
Lead Channel Impedance Value: 580 Ohm
Lead Channel Impedance Value: 660 Ohm
Lead Channel Pacing Threshold Amplitude: 0.25 V
Lead Channel Pacing Threshold Amplitude: 0.25 V
Lead Channel Pacing Threshold Amplitude: 0.5 V
Lead Channel Pacing Threshold Pulse Width: 0.05 ms
Lead Channel Pacing Threshold Pulse Width: 0.5 ms
Lead Channel Pacing Threshold Pulse Width: 0.5 ms
Lead Channel Sensing Intrinsic Amplitude: 11.9 mV
Lead Channel Sensing Intrinsic Amplitude: 3 mV
Lead Channel Setting Pacing Amplitude: 0.25 V
Lead Channel Setting Pacing Amplitude: 2 V
Lead Channel Setting Pacing Amplitude: 2 V
Lead Channel Setting Pacing Pulse Width: 0.05 ms
Lead Channel Setting Pacing Pulse Width: 0.5 ms
Lead Channel Setting Sensing Sensitivity: 0.5 mV
Pulse Gen Serial Number: 8906747

## 2022-01-10 LAB — BASIC METABOLIC PANEL
Anion gap: 7 (ref 5–15)
BUN: 13 mg/dL (ref 6–20)
CO2: 27 mmol/L (ref 22–32)
Calcium: 9.4 mg/dL (ref 8.9–10.3)
Chloride: 105 mmol/L (ref 98–111)
Creatinine, Ser: 1.23 mg/dL (ref 0.61–1.24)
GFR, Estimated: >60 mL/min (ref >60)
Glucose, Bld: 93 mg/dL (ref 70–99)
Potassium: 4.2 mmol/L (ref 3.5–5.1)
Sodium: 139 mmol/L (ref 135–145)

## 2022-01-27 NOTE — Progress Notes (Signed)
Remote ICD transmission.   

## 2022-02-02 ENCOUNTER — Other Ambulatory Visit: Payer: Self-pay | Admitting: Cardiology

## 2022-02-05 DIAGNOSIS — Z006 Encounter for examination for normal comparison and control in clinical research program: Secondary | ICD-10-CM

## 2022-02-05 NOTE — Research (Signed)
Called pt to check on compliance reports. Pt did not answer, LVM to call back research office.

## 2022-02-06 ENCOUNTER — Encounter: Payer: Self-pay | Admitting: Pharmacist

## 2022-02-06 DIAGNOSIS — Z006 Encounter for examination for normal comparison and control in clinical research program: Secondary | ICD-10-CM

## 2022-02-06 NOTE — Research (Signed)
Called patient about compliance reports. Pt states he recently had a procedure and was recovering for a week. He will restart tomorrow. Pt states he has received one check during study period. Will reach out to billing and f/u with pt. Pt verbalized understanding.

## 2022-02-07 ENCOUNTER — Encounter (HOSPITAL_COMMUNITY): Payer: Self-pay

## 2022-02-07 NOTE — Progress Notes (Signed)
PCP: Tracie Harrier, MD Cardiology: Dr. Rockey Situ HF Cardiology: Dr. Aundra Dubin  57 y.o. with history of CAD s/p CABG, ischemic cardiomyopathy, and prior Hodgkins lymphoma was referred by Dr. Rockey Situ for CHF clinic evaluation.  Patient had Hodgkins disease diagnosed in 1994 and was treated with chemotherapy and radiation.  A strong family history of CAD and also the chest wall radiation with Hodgkins treatment likely predisposed him to premature CAD.  He had CABG in 2013. Echo at the time of CABG showed EF 30%.  EF has been low since that time except for cMRI done in 2/19 which showed EF 43%.  Most recent echo in 2/23 showed EF 20-25%, normal RV.  He had worsening dyspnea as well as chest pain in 1/23 and 2/23.  Cath was done, showing 90% proximal-mid RCA stenosis with atretic RIMA-PDA.  This was treated with DES x 2.  RHC showed prominent RV failure.    Echo in 5/23 showed EF 25-30% with septal-lateral dyssynchrony and mild RV dysfunction. Patient had St Jude ICD with left bundle lead placed.  Echo was done today and reviewed, EF 35%, mild RV dysfunction.   He returns for followup of CHF.  Weight is up but he reports eating more.  He will start CPAP soon.  Currently doing well, feels better since device was reprogrammed at last visit with Dr. Caryl Comes.  No significant dyspnea walking on flat ground.  He gets short of breath carrying a heavy load.  No orthopnea/PND.  No chest pain.  No palpitations.     Labs (2/23): LDL 119, TGs 291, K 5.3 (stopped KCl), creatinine 1.18 Labs (5/23): K 5.3, creatinine 1.3, LDL 16, TGs 69 Labs (6/23): K 4.4, creatinine 1.25 Labs (8/23): K 5.1, creatinine 1.34  ECG (personally reviewed): NSR, left bundle pacing  St Jude device interrogation: >99% left bundle pacing, no AF/VT, stable thoracic impedance.   PMH: 1. Hodgkins disease: Treated with chemotherapy and chest radiation in 1994.  2. CAD: CABG x 3 in 2013 with LIMA-LAD, RIMA-RCA, free radial-OM2.  - LHC (2/23): 90%  proximal-mid RCA stenosis with atretic RIMA-RCA, patient had DES x 2 to proximal and mid RCA.  3. H/o COVID 4. Hyperlipidemia 5. Gout 6. Chronic systolic CHF: Ischemic cardiomyopathy.  He has a [employer] ICD with left bundle lead.  - Echo (10/13): EF 30% - Echo (2018): EF 20-25%, moderate RV dysfunction.  - Cardiac MRI (2/19): EF 43%, minimal coronary pattern LGE.   - Echo (4/21): EF 20-25%, normal RV - Echo (2/23): EF 20-25%, normal RV - RHC (2/23): mean RA 18, PA 33/18 mean 23, PAPI < 1, CI 2.0 Fick/2.5 thermo.  - Echo (5/23): EF 25-30% with septal-lateral dyssynchrony and mild RV dysfunction. - Echo (11/23): EF 35%, mildly decreased RV systolic function.  7. OSA: Moderate.   Social History   Socioeconomic History   Marital status: Married    Spouse name: Not on file   Number of children: Not on file   Years of education: Not on file   Highest education level: Not on file  Occupational History   Not on file  Tobacco Use   Smoking status: Former    Packs/day: 0.50    Types: Cigarettes    Quit date: 10/21/1996    Years since quitting: 25.3   Smokeless tobacco: Former    Types: Snuff    Quit date: 04/05/2019  Vaping Use   Vaping Use: Never used  Substance and Sexual Activity   Alcohol use: No  Drug use: No   Sexual activity: Yes  Other Topics Concern   Not on file  Social History Narrative   Not on file   Social Determinants of Health   Financial Resource Strain: Low Risk  (04/18/2021)   Overall Financial Resource Strain (CARDIA)    Difficulty of Paying Living Expenses: Not hard at all  Food Insecurity: No Food Insecurity (04/18/2021)   Hunger Vital Sign    Worried About Running Out of Food in the Last Year: Never true    Ran Out of Food in the Last Year: Never true  Transportation Needs: No Transportation Needs (04/18/2021)   PRAPARE - Hydrologist (Medical): No    Lack of Transportation (Non-Medical): No  Physical Activity: Not on file   Stress: Not on file  Social Connections: Not on file  Intimate Partner Violence: Not on file   Family History  Problem Relation Age of Onset   Coronary artery disease Other    Heart disease Mother    Heart attack Father    ROS: All systems reviewed and negative except as per HPI.   Current Outpatient Medications  Medication Sig Dispense Refill   aspirin EC 81 MG tablet Take 81 mg by mouth every evening.     bisoprolol (ZEBETA) 5 MG tablet Take 1 tablet (5 mg total) by mouth daily. 90 tablet 3   clopidogrel (PLAVIX) 75 MG tablet Take 1 tablet (75 mg total) by mouth daily with breakfast. 90 tablet 3   colchicine 0.6 MG tablet Take 0.6 mg by mouth daily as needed (GOUT FLARES).     dapagliflozin propanediol (FARXIGA) 10 MG TABS tablet Take 1 tablet (10 mg total) by mouth daily with breakfast. 90 tablet 3   eplerenone (INSPRA) 25 MG tablet Take 2 tablets (50 mg total) by mouth at bedtime. 60 tablet 11   Evolocumab (REPATHA SURECLICK) 789 MG/ML SOAJ Inject 1 pen. into the skin every 14 (fourteen) days. 6 mL 3   furosemide (LASIX) 40 MG tablet Take 0.5 tablets (20 mg total) by mouth daily as needed for fluid. 30 tablet 3   icosapent Ethyl (VASCEPA) 1 g capsule TAKE TWO CAPSULES BY MOUTH TWICE A DAY 120 capsule 11   pantoprazole (PROTONIX) 40 MG tablet Take 40 mg by mouth in the morning.     rosuvastatin (CRESTOR) 40 MG tablet Take 1 tablet (40 mg total) by mouth daily. 90 tablet 3   sacubitril-valsartan (ENTRESTO) 24-26 MG Take 1 tablet by mouth 2 (two) times daily. 180 tablet 3   senna (SENOKOT) 8.6 MG tablet Take 4 tablets by mouth daily with supper.     vitamin B-12 (CYANOCOBALAMIN) 1000 MCG tablet Take 1 tablet (1,000 mcg total) by mouth daily. 30 tablet 1   No current facility-administered medications for this visit.   There were no vitals taken for this visit. General: NAD Neck: No JVD, no thyromegaly or thyroid nodule.  Lungs: Clear to auscultation bilaterally with normal  respiratory effort. CV: Nondisplaced PMI.  Heart regular S1/S2, no S3/S4, no murmur.  No peripheral edema.  No carotid bruit.  Normal pedal pulses.  Abdomen: Soft, nontender, no hepatosplenomegaly, no distention.  Skin: Intact without lesions or rashes.  Neurologic: Alert and oriented x 3.  Psych: Normal affect. Extremities: No clubbing or cyanosis.  HEENT: Normal.   Assessment/Plan: 1. CAD: s/p CABG in 2013 and DES x 2 to native RCA in 2/23 with atretic RIMA-PDA noted. No further chest pain since PCI.  -  Continue ASA 81 and Plavix 75.  - Continue Crestor and Zetia, good lipids in 5/23. 2. Hyperlipidemia: He is now on Repatha, Crestor, Vascepa. Good lipids in 5/23.  3. Chronic systolic CHF: Ischemic cardiomyopathy. Echo in 2/23 with EF 20-25%, normal RV.  [place] in 2/23 showed prominent RV failure with PAPI < 1.  Echo in 5/23 showed EF 25-30% with septal-lateral dyssynchrony and mild RV dysfunction.  St Jude ICD with left bundle lead was placed.  Echo today showed EF slightly higher at 35%.  NYHA class II, not volume overloaded.   - Breast pain with spironolactone, now on eplerenone 50 mg daily.  K controlled by [facility], currently at 10 g daily.  BMET/BNP today.  - Increase bisoprolol to 5 mg daily.    - Continue Entresto 24/26 bid, he was lightheaded when we tried to increase.  - Continue Farxiga 10 mg daily.  4. OSA: Waiting for CPAP.   Followup in 3 months with APP.   [place] 02/07/2022  CARDIAC PHYSICAL EXAMINATION        '[]'$ Not Done  Total JVP: 7 cm   (Normal = up to total of 7cm)   Peripheral Edema Grade: x 0   ? 1   ? 2  ? 3 ? 4   PHYSICIANS DYSPNEA ASSESSMENT*                                 '[]'$  Not Done  Dyspnea x Yes ? No  NYHA Class ? 1    x 2     ? 3     ? 4  Speech Dyspnea ? Yes x No  Paroxysmal Nocturnal Dyspnea  ? Yes x No  Orthopnea ? Yes X No  Pulmonary Congestion (if yes, check severe under 'Medical Condition' below) ? Yes x No  Pleural Fluid ? Right ?  Left ? Both      x N/A  Wheezing ? Yes x No  Severity of medical condition (select 'severe' if pulmonary congestion is present): ? Mild   x Moderate ? Severe  Comments: __________________________________________________________________________________________________________________________________________________________________________________________________________________________________________

## 2022-02-10 ENCOUNTER — Encounter (HOSPITAL_COMMUNITY): Payer: Self-pay

## 2022-02-10 ENCOUNTER — Ambulatory Visit (HOSPITAL_COMMUNITY)
Admission: RE | Admit: 2022-02-10 | Discharge: 2022-02-10 | Disposition: A | Discharge status: Home / Self Care | Payer: Managed Care, Other (non HMO) | Source: Ambulatory Visit | Attending: Family Medicine | Admitting: Family Medicine

## 2022-02-10 ENCOUNTER — Telehealth (HOSPITAL_COMMUNITY): Payer: Self-pay

## 2022-02-10 ENCOUNTER — Ambulatory Visit: Payer: Managed Care, Other (non HMO) | Admitting: Internal Medicine

## 2022-02-10 ENCOUNTER — Ambulatory Visit: Payer: Managed Care, Other (non HMO)

## 2022-02-10 VITALS — BP 168/95 | HR 62 | Wt 225.8 lb

## 2022-02-10 DIAGNOSIS — I5022 Chronic systolic (congestive) heart failure: Secondary | ICD-10-CM

## 2022-02-10 DIAGNOSIS — Z951 Presence of aortocoronary bypass graft: Secondary | ICD-10-CM | POA: Diagnosis not present

## 2022-02-10 DIAGNOSIS — Z923 Personal history of irradiation: Secondary | ICD-10-CM | POA: Diagnosis not present

## 2022-02-10 DIAGNOSIS — Z95810 Presence of automatic (implantable) cardiac defibrillator: Secondary | ICD-10-CM | POA: Insufficient documentation

## 2022-02-10 DIAGNOSIS — Z7902 Long term (current) use of antithrombotics/antiplatelets: Secondary | ICD-10-CM | POA: Diagnosis not present

## 2022-02-10 DIAGNOSIS — Z87891 Personal history of nicotine dependence: Secondary | ICD-10-CM | POA: Insufficient documentation

## 2022-02-10 DIAGNOSIS — Z8616 Personal history of COVID-19: Secondary | ICD-10-CM | POA: Diagnosis not present

## 2022-02-10 DIAGNOSIS — Z79899 Other long term (current) drug therapy: Secondary | ICD-10-CM | POA: Diagnosis not present

## 2022-02-10 DIAGNOSIS — I2510 Atherosclerotic heart disease of native coronary artery without angina pectoris: Secondary | ICD-10-CM

## 2022-02-10 DIAGNOSIS — Z8571 Personal history of Hodgkin lymphoma: Secondary | ICD-10-CM | POA: Diagnosis not present

## 2022-02-10 DIAGNOSIS — Z955 Presence of coronary angioplasty implant and graft: Secondary | ICD-10-CM | POA: Insufficient documentation

## 2022-02-10 DIAGNOSIS — E785 Hyperlipidemia, unspecified: Secondary | ICD-10-CM | POA: Insufficient documentation

## 2022-02-10 DIAGNOSIS — Z8249 Family history of ischemic heart disease and other diseases of the circulatory system: Secondary | ICD-10-CM | POA: Diagnosis not present

## 2022-02-10 DIAGNOSIS — Z9221 Personal history of antineoplastic chemotherapy: Secondary | ICD-10-CM | POA: Insufficient documentation

## 2022-02-10 DIAGNOSIS — I255 Ischemic cardiomyopathy: Secondary | ICD-10-CM | POA: Insufficient documentation

## 2022-02-10 DIAGNOSIS — G4733 Obstructive sleep apnea (adult) (pediatric): Secondary | ICD-10-CM | POA: Diagnosis not present

## 2022-02-10 LAB — BASIC METABOLIC PANEL
Anion gap: 8 (ref 5–15)
BUN: 12 mg/dL (ref 6–20)
CO2: 25 mmol/L (ref 22–32)
Calcium: 9.6 mg/dL (ref 8.9–10.3)
Chloride: 105 mmol/L (ref 98–111)
Creatinine, Ser: 1.2 mg/dL (ref 0.61–1.24)
GFR, Estimated: >60 mL/min (ref >60)
Glucose, Bld: 105 mg/dL — ABNORMAL HIGH (ref 70–99)
Potassium: 5.5 mmol/L — ABNORMAL HIGH (ref 3.5–5.1)
Sodium: 138 mmol/L (ref 135–145)

## 2022-02-10 LAB — BRAIN NATRIURETIC PEPTIDE: B Natriuretic Peptide: 175.5 pg/mL — ABNORMAL HIGH (ref 0.0–100.0)

## 2022-02-10 MED ORDER — EPLERENONE 25 MG PO TABS: 50.0000 mg | ORAL_TABLET | Freq: Every day | ORAL | 11 refills | Status: DC

## 2022-02-10 MED ORDER — LOKELMA 10 G PO PACK: 10.0000 g | PACK | Freq: Every day | ORAL | 11 refills | Status: DC

## 2022-02-10 NOTE — Telephone Encounter (Addendum)
-----   Message from Rafael Bihari, [place] sent at 02/10/2022 12:00 PM EST ----- Please call his wife, Butch Penny, and ask if he has been taking his eplerenone + Lokelma. He did not think he was taking, but his wife manages his medications.    Unable to reach spouse, who sets up medications for patient.  Spoke with patient regarding lab results. Current medication confirmation: pt states he is taking eplerenone '50mg'$  daily, lokelma '15mg'$  EOD, stopped allopurinol.  Change: lokelma to '10mg'$  daily. Continue eplerenone daily. Redraw BMET 2/9 @ 8:30a. Appt scheduled, lab ordered. Pt aware, agreeable, and verbalized understanding, confirmed plan and changes prior to ending call. Mychart message sent with medication changes and lab appt.

## 2022-02-10 NOTE — Patient Instructions (Addendum)
Labs done today. We will contact you only if your labs are abnormal.  STOP taking Aspirin  RESTART Lokelma '10mg'$  (1 packet) daily.   RESTART Elperenone '50mg'$  (2 tablets) by mouth daily.   No other medication changes were made. Please continue all current medications as prescribed.  Your physician recommends that you schedule a follow-up appointment in: 7-10 days for a lab only appointment and in 4 months with Dr. Aundra Dubin. Please contact our office in April 2024 to schedule a June 2024 appointment.   If you have any questions or concerns before your next appointment please send [place] a message through [place] or call our office at [phone].    TO LEAVE A MESSAGE FOR THE NURSE SELECT OPTION 2, PLEASE LEAVE A MESSAGE INCLUDING: YOUR NAME DATE OF BIRTH CALL BACK NUMBER REASON FOR CALL**this is important as we prioritize the call backs  YOU WILL RECEIVE A CALL BACK THE SAME DAY AS LONG AS YOU CALL BEFORE 4:00 PM   Do the following things EVERYDAY: Weigh yourself in the morning before breakfast. Write it down and keep it in a log. Take your medicines as prescribed Eat low salt foods--Limit salt (sodium) to 2000 mg per day.  Stay as active as you can everyday Limit all fluids for the day to less than 2 liters   At the [facility], you and your health needs are our priority. As part of our continuing mission to provide you with exceptional heart care, we have created designated Provider Care Teams. These Care Teams include your primary Cardiologist (physician) and Advanced Practice Providers (APPs- Physician Assistants and Nurse Practitioners) who all work together to provide you with the care you need, when you need it.   You may see any of the following providers on your designated Care Team at your next follow up: Dr Glori Bickers Dr Haynes Kerns, NP Lyda Jester, [place] Audry Riles, PharmD   Please be sure to bring in all your medications bottles to  every appointment.

## 2022-02-10 NOTE — Progress Notes (Signed)
Pt did not show for scheduled appointment.  

## 2022-02-11 ENCOUNTER — Other Ambulatory Visit (HOSPITAL_COMMUNITY): Payer: Self-pay

## 2022-02-11 ENCOUNTER — Encounter (HOSPITAL_COMMUNITY): Payer: Self-pay

## 2022-02-11 MED ORDER — DAPAGLIFLOZIN PROPANEDIOL 10 MG PO TABS: 10.0000 mg | ORAL_TABLET | Freq: Every day | ORAL | 3 refills | Status: DC

## 2022-02-13 ENCOUNTER — Encounter (HOSPITAL_COMMUNITY): Payer: Self-pay

## 2022-02-14 ENCOUNTER — Encounter (HOSPITAL_COMMUNITY): Payer: Self-pay

## 2022-02-14 ENCOUNTER — Ambulatory Visit (HOSPITAL_COMMUNITY)
Admission: RE | Admit: 2022-02-14 | Discharge: 2022-02-14 | Disposition: A | Discharge status: Home / Self Care | Payer: Managed Care, Other (non HMO) | Source: Ambulatory Visit | Attending: Cardiology | Admitting: Cardiology

## 2022-02-14 DIAGNOSIS — I5022 Chronic systolic (congestive) heart failure: Secondary | ICD-10-CM | POA: Diagnosis not present

## 2022-02-14 LAB — BASIC METABOLIC PANEL
Anion gap: 9 (ref 5–15)
BUN: 14 mg/dL (ref 6–20)
CO2: 26 mmol/L (ref 22–32)
Calcium: 9.2 mg/dL (ref 8.9–10.3)
Chloride: 103 mmol/L (ref 98–111)
Creatinine, Ser: 1.19 mg/dL (ref 0.61–1.24)
GFR, Estimated: >60 mL/min (ref >60)
Glucose, Bld: 94 mg/dL (ref 70–99)
Potassium: 5 mmol/L (ref 3.5–5.1)
Sodium: 138 mmol/L (ref 135–145)

## 2022-02-18 ENCOUNTER — Other Ambulatory Visit (HOSPITAL_COMMUNITY): Payer: Managed Care, Other (non HMO)

## 2022-03-04 ENCOUNTER — Encounter: Payer: Self-pay | Admitting: Pharmacist

## 2022-03-04 ENCOUNTER — Encounter: Payer: Self-pay | Admitting: Internal Medicine

## 2022-03-10 DIAGNOSIS — Z006 Encounter for examination for normal comparison and control in clinical research program: Secondary | ICD-10-CM

## 2022-03-10 NOTE — Research (Addendum)
DETECT FOLLOW UP VISIT via PHONE - MONTH 3 (+/- 2 weeks)  VISIT DETAILS  Date of Visit: 04/Mar/2024   NYHA  NYHA Class: ? 1    X 2     ? 3     ? 4    SMOKING HISTORY  Has the subject smoked tobacco since the last visit? ? Yes X No  Does the subject currently smoke tobacco? ? Yes X No  If yes, how many packs per week? __________   CONCOMITANT MEDICATIONS  Have there been any changes to the subject's medications since last visit?  If yes, please update Concomitant Medications Log. X Yes ? No   ADVERSE EVENTS  Have any new adverse events (AEs) occurred since last visit?  If yes, please update Adverse Events Log. ? Yes X No   ELECTROCARDIOGRAM*                                                                                                                                       '[x]'$  Not Done Information will be extracted from the subject's most recent ECG in case one was conducted since the last CHF clinic visit.  ECHOCARDIOGRAPHY*                             *Upload Echocardiogram in [facility]                                                   '[x]'$  Not Done Information will be extracted from the subjects most recent Echocardiogram in case one was conducted since the last CHF clinic visit.    Current Outpatient Medications:    bisoprolol (ZEBETA) 5 MG tablet, Take 1 tablet (5 mg total) by mouth daily., Disp: 90 tablet, Rfl: 3   clopidogrel (PLAVIX) 75 MG tablet, Take 1 tablet (75 mg total) by mouth daily with breakfast., Disp: 90 tablet, Rfl: 3   colchicine 0.6 MG tablet, Take 0.6 mg by mouth daily as needed (GOUT FLARES)., Disp: , Rfl:    dapagliflozin propanediol (FARXIGA) 10 MG TABS tablet, Take 1 tablet (10 mg total) by mouth daily with breakfast., Disp: 90 tablet, Rfl: 3   eplerenone (INSPRA) 25 MG tablet, Take 2 tablets (50 mg total) by mouth at bedtime., Disp: 60 tablet, Rfl: 11   Evolocumab (REPATHA SURECLICK) [id number] MG/ML SOAJ, Inject 1 pen. into the skin every 14 (fourteen) days., Disp: 6  mL, Rfl: 3   furosemide (LASIX) 40 MG tablet, Take 20 mg by mouth every other day., Disp: , Rfl:    icosapent Ethyl (VASCEPA) 1 g capsule, TAKE TWO CAPSULES BY MOUTH TWICE A DAY, Disp: 120 capsule, Rfl: 11   pantoprazole (PROTONIX) 40 MG tablet, Take 40  mg by mouth in the morning., Disp: , Rfl:    rosuvastatin (CRESTOR) 40 MG tablet, Take 1 tablet (40 mg total) by mouth daily., Disp: 90 tablet, Rfl: 3   sacubitril-valsartan (ENTRESTO) 24-26 MG, Take 1 tablet by mouth 2 (two) times daily., Disp: 180 tablet, Rfl: 3   senna (SENOKOT) 8.6 MG tablet, Take 4 tablets by mouth daily with supper., Disp: , Rfl:    sodium zirconium cyclosilicate (LOKELMA) 10 g PACK packet, Take 10 g by mouth daily., Disp: 30 packet, Rfl: 11   vitamin B-12 (CYANOCOBALAMIN) 1000 MCG tablet, Take 1 tablet (1,000 mcg total) by mouth daily., Disp: 30 tablet, Rfl: 1

## 2022-04-11 ENCOUNTER — Ambulatory Visit (INDEPENDENT_AMBULATORY_CARE_PROVIDER_SITE_OTHER): Payer: Managed Care, Other (non HMO)

## 2022-04-11 DIAGNOSIS — I447 Left bundle-branch block, unspecified: Secondary | ICD-10-CM

## 2022-04-13 ENCOUNTER — Encounter: Payer: Self-pay | Admitting: Internal Medicine

## 2022-04-13 ENCOUNTER — Other Ambulatory Visit: Payer: Self-pay | Admitting: Cardiovascular Disease

## 2022-04-13 LAB — CUP PACEART REMOTE DEVICE CHECK
Battery Remaining Longevity: 80 mo
Battery Remaining Percentage: 84 %
Battery Voltage: 3.05 V
Brady Statistic AP VP Percent: 26 %
Brady Statistic AP VS Percent: 1 %
Brady Statistic AS VP Percent: 74 %
Brady Statistic AS VS Percent: 1 %
Brady Statistic RA Percent Paced: 26 %
Date Time Interrogation Session: 20240405020015
HighPow Impedance: 72 Ohm
HighPow Impedance: 72 Ohm
Implantable Lead Connection Status: 753985
Implantable Lead Connection Status: 753985
Implantable Lead Connection Status: 753985
Implantable Lead Implant Date: 20230703
Implantable Lead Implant Date: 20230703
Implantable Lead Implant Date: 20230703
Implantable Lead Location: 753858
Implantable Lead Location: 753859
Implantable Lead Location: 753860
Implantable Lead Model: 3830
Implantable Lead Model: 7122
Implantable Pulse Generator Implant Date: 20230703
Lead Channel Impedance Value: 410 Ohm
Lead Channel Impedance Value: 590 Ohm
Lead Channel Impedance Value: 680 Ohm
Lead Channel Pacing Threshold Amplitude: 0.25 V
Lead Channel Pacing Threshold Amplitude: 0.25 V
Lead Channel Pacing Threshold Amplitude: 0.5 V
Lead Channel Pacing Threshold Pulse Width: 0.05 ms
Lead Channel Pacing Threshold Pulse Width: 0.5 ms
Lead Channel Pacing Threshold Pulse Width: 0.5 ms
Lead Channel Sensing Intrinsic Amplitude: 11.9 mV
Lead Channel Sensing Intrinsic Amplitude: 2.8 mV
Lead Channel Setting Pacing Amplitude: 0.25 V
Lead Channel Setting Pacing Amplitude: 2 V
Lead Channel Setting Pacing Amplitude: 2 V
Lead Channel Setting Pacing Pulse Width: 0.05 ms
Lead Channel Setting Pacing Pulse Width: 0.5 ms
Lead Channel Setting Sensing Sensitivity: 0.5 mV
Pulse Gen Serial Number: 8906747

## 2022-04-14 NOTE — Telephone Encounter (Signed)
Pt scheduled on 4/24.

## 2022-04-14 NOTE — Telephone Encounter (Signed)
Please schedule overdue F/U appt for further refills. Thank you!

## 2022-04-27 ENCOUNTER — Other Ambulatory Visit: Payer: Self-pay | Admitting: Cardiovascular Disease

## 2022-04-30 ENCOUNTER — Ambulatory Visit: Payer: Managed Care, Other (non HMO) | Admitting: Physician Assistant

## 2022-05-03 ENCOUNTER — Encounter: Payer: Self-pay | Admitting: Internal Medicine

## 2022-05-06 ENCOUNTER — Other Ambulatory Visit: Payer: Self-pay | Admitting: Cardiovascular Disease

## 2022-05-07 ENCOUNTER — Encounter: Payer: Self-pay | Admitting: Cardiovascular Disease

## 2022-05-07 ENCOUNTER — Other Ambulatory Visit: Payer: Self-pay | Admitting: Cardiovascular Disease

## 2022-05-07 NOTE — Telephone Encounter (Signed)
Faxed to [facility] and receipt confirmed on 04/28/22: E-Prescribing Status: Receipt confirmed by pharmacy (04/28/2022  8:59 AM EDT)

## 2022-05-08 MED ORDER — REPATHA SURECLICK 140 MG/ML ~~LOC~~ SOAJ: SUBCUTANEOUS | 11 refills | Status: DC

## 2022-05-10 ENCOUNTER — Other Ambulatory Visit: Payer: Self-pay | Admitting: Cardiovascular Disease

## 2022-05-12 ENCOUNTER — Other Ambulatory Visit: Payer: Self-pay | Admitting: Cardiovascular Disease

## 2022-05-14 ENCOUNTER — Telehealth: Payer: Self-pay

## 2022-05-14 ENCOUNTER — Other Ambulatory Visit (HOSPITAL_COMMUNITY): Payer: Self-pay | Admitting: Cardiology

## 2022-05-14 ENCOUNTER — Other Ambulatory Visit (HOSPITAL_COMMUNITY): Payer: Self-pay

## 2022-05-14 IMAGING — US US RENAL
1 series · 14 of 25 positions shown · non-contrast
Comparison: 06/13/2013

CLINICAL DATA: History of renal calculi, left lower quadrant and
mid back pain, abnormal urinalysis

EXAM:
RENAL / URINARY TRACT ULTRASOUND COMPLETE

[Series 1: us renal · 14 of 48 slices shown]
[im 1/48]
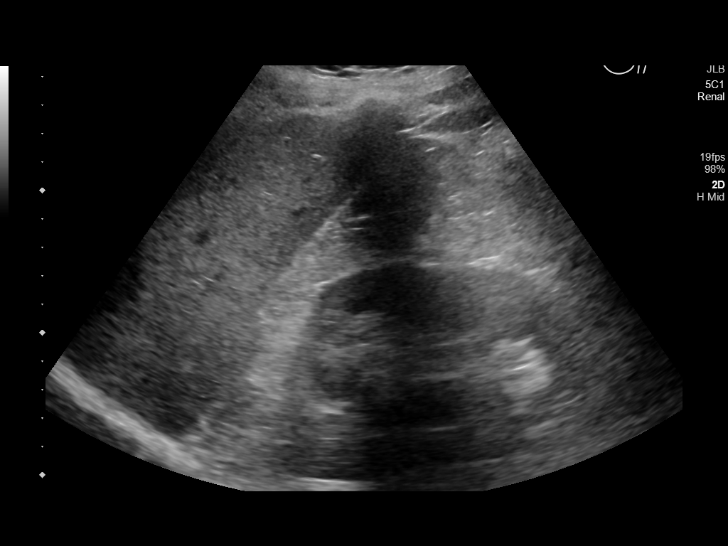
[im 4/48]
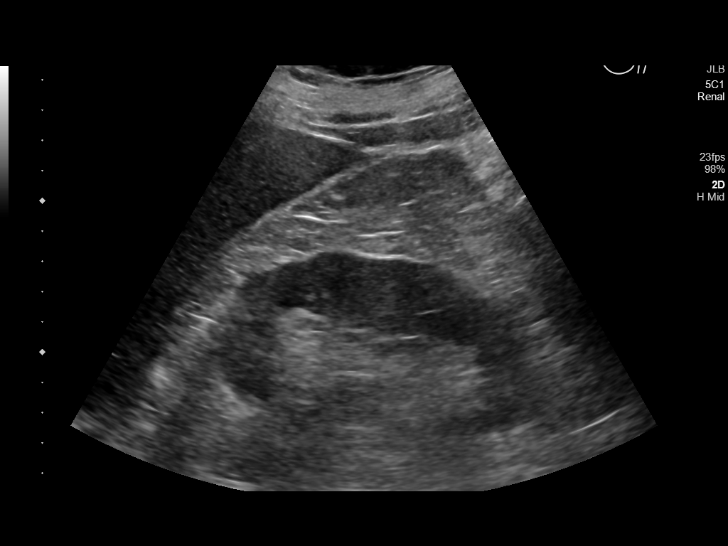
[im 8/48]
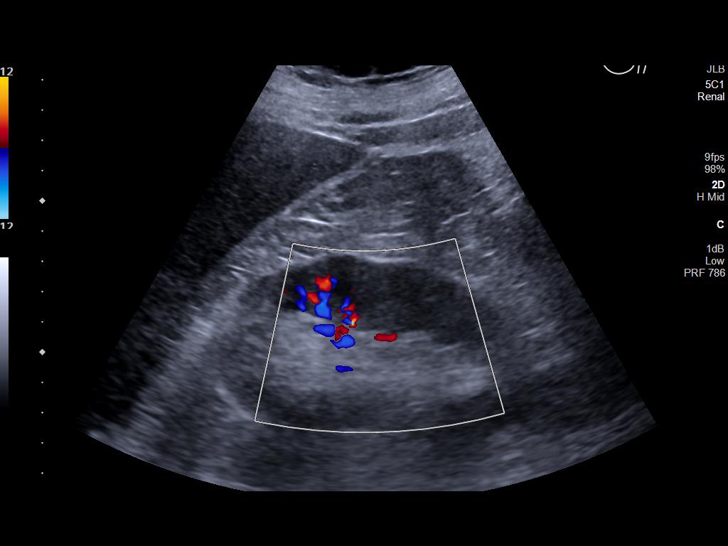
[im 12/48]
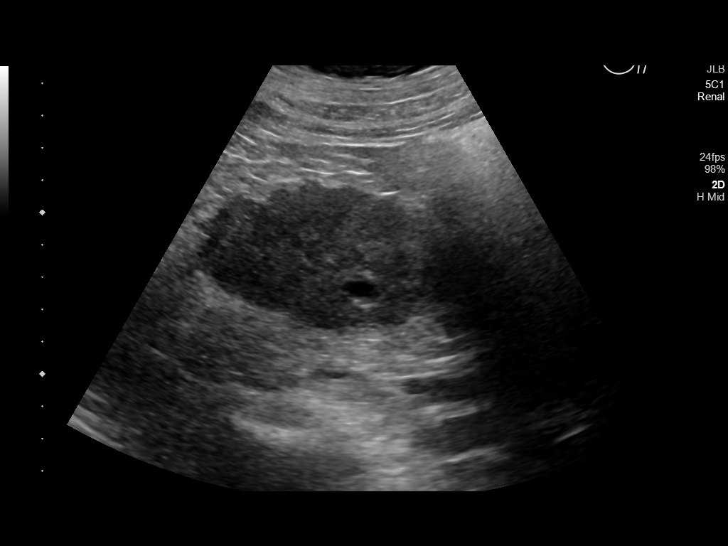
[im 16/48]
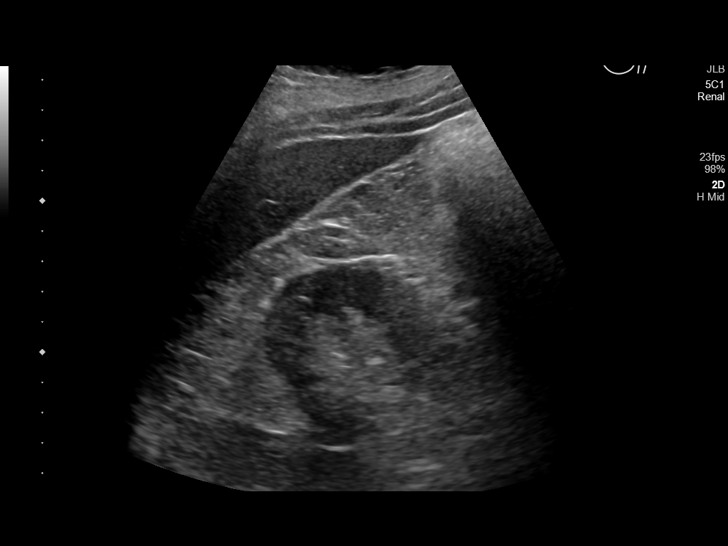
[im 18/48]
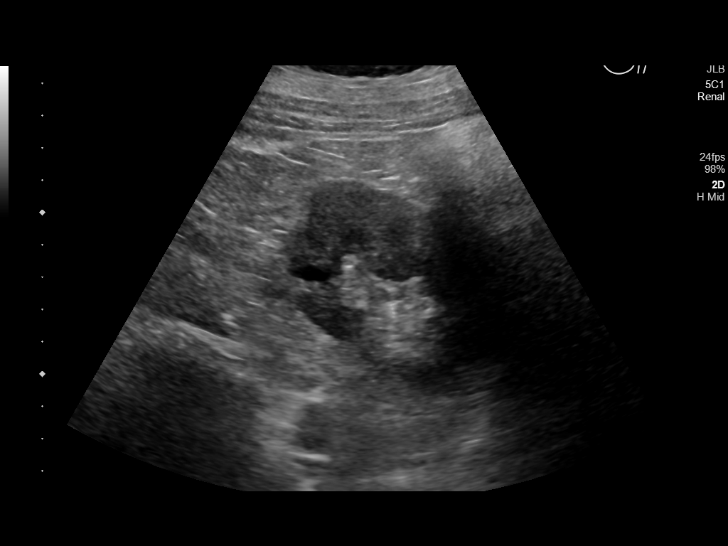
[im 22/48]
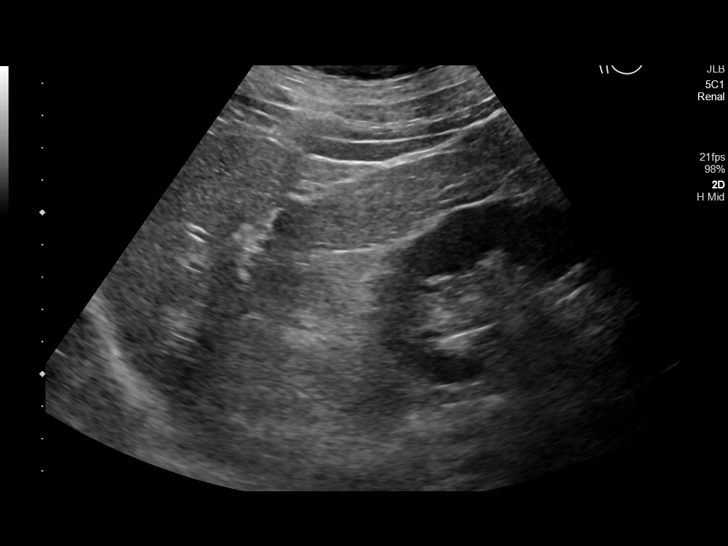
[im 26/48]
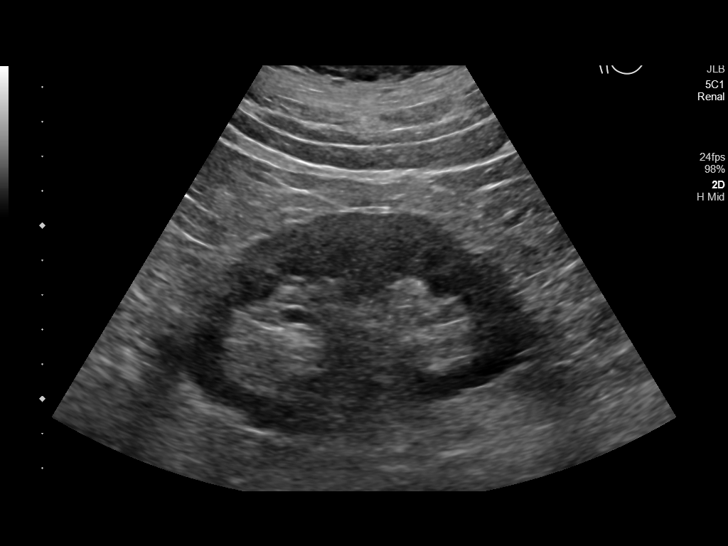
[im 30/48]
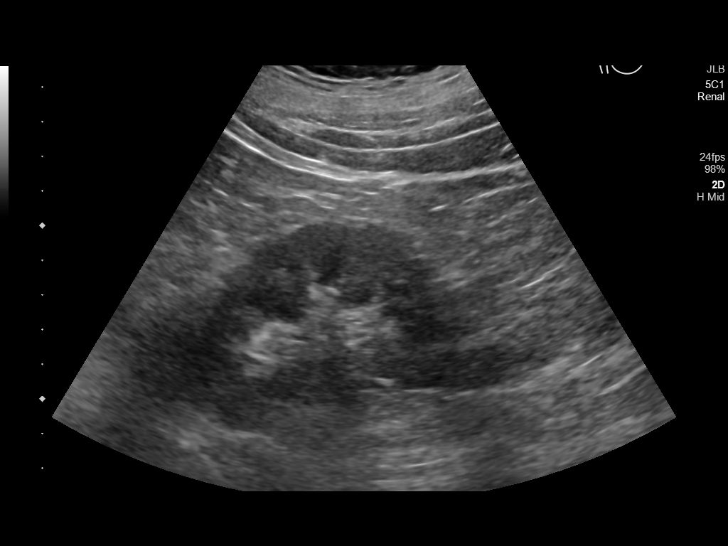
[im 32/48]
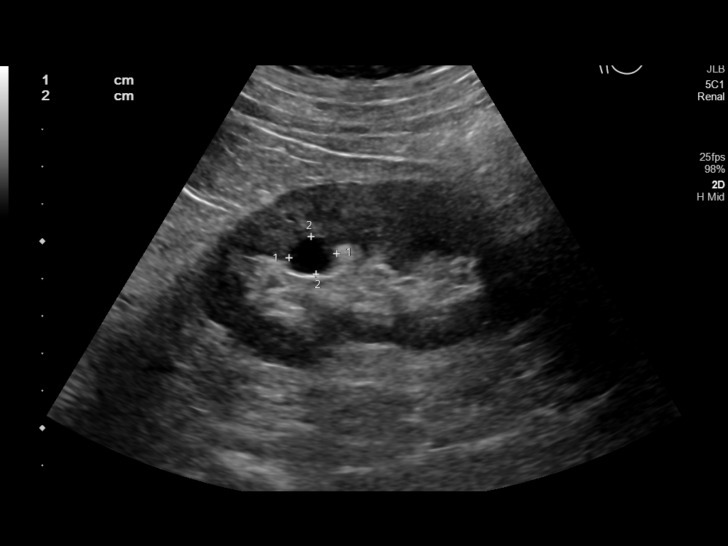
[im 36/48]
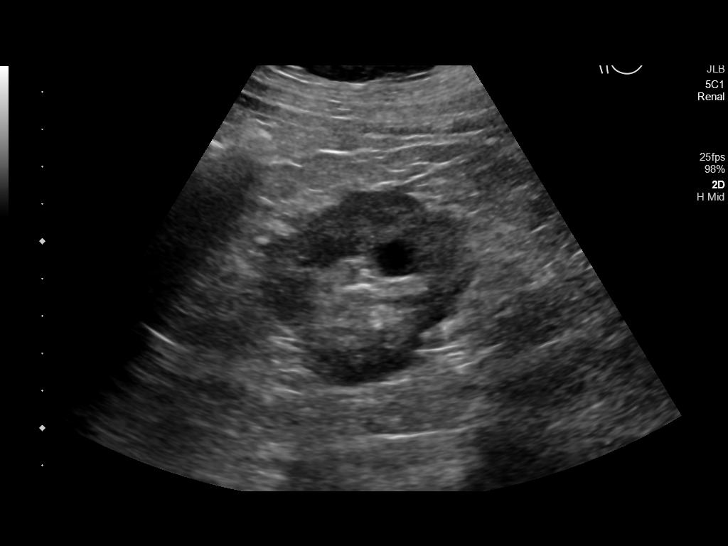
[im 40/48]
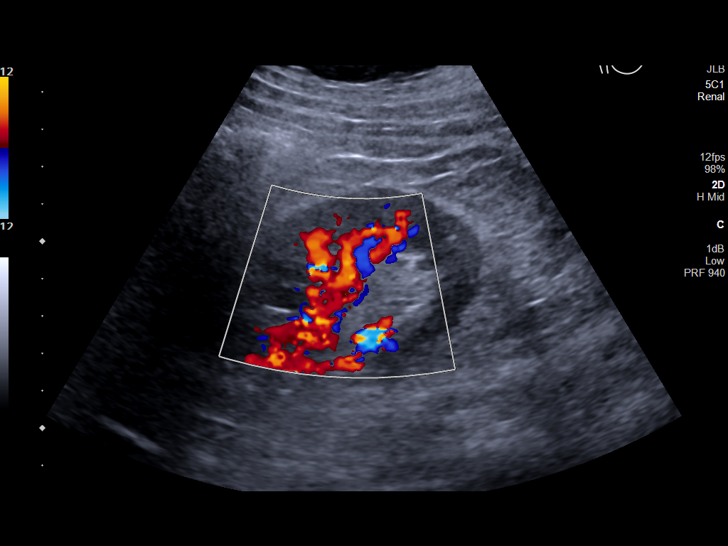
[im 44/48]
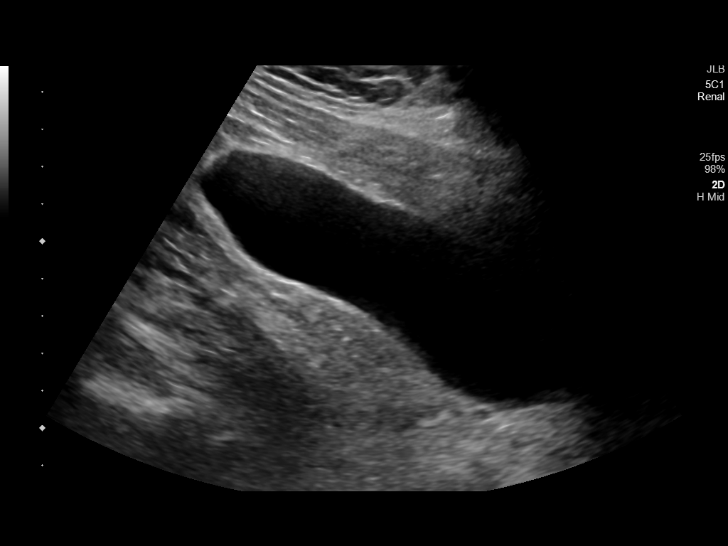
[im 48/48]
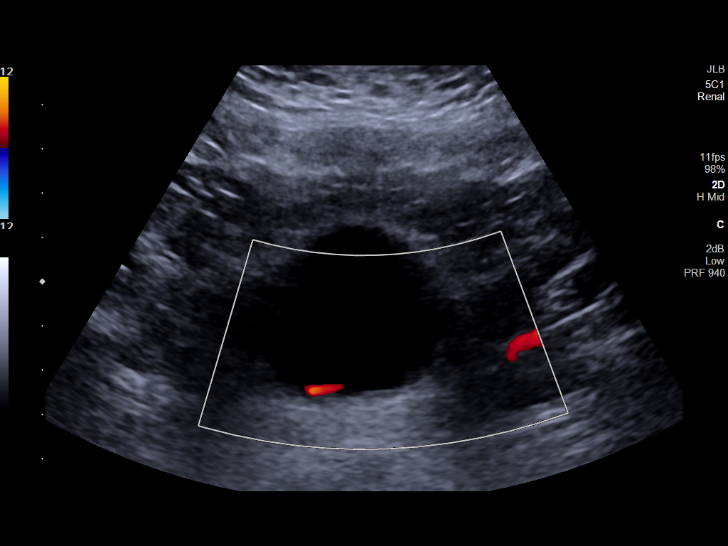

[14 of 25 positions shown; findings below may reference images not displayed]

FINDINGS: Right Kidney:

Renal measurements: 10.9 x 5.8 by 5.6 cm = volume: 184.1 mL. There
are 2 small cysts identified within the kidney, measuring 8 mm in
the upper pole and 12 mm in the interpolar region. No other renal
abnormalities. Normal echotexture. No hydronephrosis or
nephrolithiasis.

Left Kidney:

Renal measurements: 9.6 x 5.3 by 4.7 cm = volume: 125.1 mL. Left
renal cysts are seen, measuring 13 mm in the upper pole peripelvic
region and 7 mm in the interpolar region. No other renal
abnormalities. Normal echotexture. No hydronephrosis or
nephrolithiasis.

Bladder:

Appears normal for degree of bladder distention.

Other:

None.
IMPRESSION: 1. Benign bilateral renal cysts.
2. No evidence of urinary tract calculi or obstructive uropathy.
3. Normal renal echotexture.

## 2022-05-14 NOTE — Telephone Encounter (Signed)
Pending, see separate encounter with update on determination

## 2022-05-14 NOTE — Telephone Encounter (Signed)
Pharmacy Patient Advocate Encounter   Received notification from [employer] that prior authorization for REPATHA is needed.    PA submitted on 05/14/22 Key [id number] Status is pending  Haze Rushing, CPhT Pharmacy Patient Advocate Specialist Direct Number: [phone] Fax: [phone]

## 2022-05-14 NOTE — Progress Notes (Signed)
Remote ICD transmission.   

## 2022-05-14 NOTE — Telephone Encounter (Signed)
Pharmacy Patient Advocate Encounter  Prior Authorization for REPATHA has been approved.    PA# [id number] Effective dates: 05/14/22 through 05/14/23  Haze Rushing, CPhT Pharmacy Patient Advocate Specialist Direct Number: [phone] Fax: [phone]

## 2022-05-15 ENCOUNTER — Other Ambulatory Visit (HOSPITAL_COMMUNITY): Payer: Self-pay

## 2022-05-15 DIAGNOSIS — I5022 Chronic systolic (congestive) heart failure: Secondary | ICD-10-CM

## 2022-05-15 MED ORDER — LOKELMA 10 G PO PACK
10.0000 g | PACK | Freq: Every day | ORAL | 11 refills | Status: DC
Start: 2022-05-15 — End: 2023-01-22

## 2022-05-22 NOTE — Progress Notes (Signed)
Cardiology Office Note    Date:  05/27/2022   ID:  Willie [name], DOB 1965-05-24, MRN [id number]  PCP:  Willie Reichmann, MD  Cardiologist:  Julien Nordmann, MD  Electrophysiologist:  None   Chief Complaint: Follow up  History of Present Illness:   Willie [name] is a 57 y.o. male with history of CAD status post three-vessel CABG in 2013, HFrEF secondary to ICM status post SJM ICD with left bundle lead, Hodgkin's lymphoma diagnosed in 1994 treated with chemoradiation, HTN, HLD, OSA intolerant to CPAP, and strong family history of CAD who presents for follow-up of his CAD and cardiomyopathy.  He underwent three-vessel CABG in 2013.  Echo at that time showed an EF of 30%.  Cardiomyopathy has persisted throughout the years.  Cardiac MRI in 2019 showed an EF of 43%.  In 01/2021, he underwent LHC which showed 90% proximal to mid RCA stenosis with an atretic RIMA to PDA.  This was treated with PCI/DES x 2.  RHC showed prominent RV failure.  Echo in 02/2021 showed an EF of 20 to 25% with normal RV.  Echo in 05/2021 showed a persistent cardiomyopathy with an EF of 25 to 30% with septal lateral dyssynchrony and mild RV dysfunction.  He subsequently underwent SJM ICD left bundle lead placement.  Echo in 11/2021 showed an EF of 35% with mild RV dysfunction.  He has been followed by the advanced heart failure service, last seeing them in 11/2021 and was feeling better following device reprogramming by EP.  He comes in accompanied by his wife today and is doing well from a cardiac perspective.  He is without symptoms of angina or cardiac decompensation.  No progressive dyspnea, palpitations, dizziness, presyncope, or syncope.  No device alarms or discharges.  No falls, hematochezia, or melena.  No lower extremity swelling, abdominal distention, progressive orthopnea, PND, early satiety.  Weight remains stable.  He does not add salt to food and does not eat out frequently.  He drinks approximately 2 L of liquid  regularly.  Adherent and tolerating all cardiac medications.  Tries to remain active at baseline, working in the yard and gardening.  Does not have any acute cardiac concerns at this time.   Labs independently reviewed: 02/2022 - potassium 5.0, BUN 14, serum creatinine 1.19 12/2021 - Hgb 14.7, PLT 191, albumin 3.9, AST/ALT normal, A1c 5.6 05/2021 - TC 62, TG 69, HDL 31, LDL 16, TSH normal  Past Medical History:  Diagnosis Date   Coronary artery disease    a. 10/2011 Cath: LM 85, LAD 20p, D1 20, LCX 20/69m, OM1 85, RCA 60ost, 44m, 50d; b. 10/2011 CABG x 3: LIMA->LAD, RIMA->dRCA, L Radial->OM2; c. 08/2015 MV: EF 33%, apical ant, apical septal, apical infarct, no ischemia.   Family history of coronary artery disease 10/23/2011   HFrEF (heart failure with reduced ejection fraction) (HCC)    a. 02/2017 cMRI: EF 43%, mod MR, minimal LGE, nl RV size/fxn; b. 04/2019 Echo: EF 25-30%, glob HK, gr2 DD, mod reduced RV fxn, triv MR/MR, Ao root 39mm.   Hodgkin's lymphoma (HCC) 1994   neck/midchest area with radiation   Hyperlipidemia 10/23/2011   Hypertension    Ischemic cardiomyopathy    a. 08/2016 Echo: EF 20-25%; b. 02/2017 cMRI: EF 43% (no indication for ICD based on this finding); c. 04/2019 Echo: EF 25-30%.   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   LIMA to LAD, RIMA to RCA, LRA to  OM2   S/P radiation therapy 10/21/1992   Radiation therapy to chest and neck for Hodgkin's lymphoma   Unstable angina (HCC) 10/22/2011    Past Surgical History:  Procedure Laterality Date   BIV ICD INSERTION CRT-D N/A 07/08/2021   Procedure: BIV ICD INSERTION CRT-D;  Surgeon: Duke Salvia, MD;  Location: [facility] INVASIVE CV LAB;  Service: Cardiovascular;  Laterality: N/A;   CARDIAC CATHETERIZATION     CARPAL TUNNEL RELEASE     CORONARY ARTERY BYPASS GRAFT  10/24/2011   Procedure: CORONARY ARTERY BYPASS GRAFTING (CABG);  Surgeon: Purcell Nails, MD;  Location: [facility] OR;  Service: Open Heart  Surgery;  Laterality: N/A;  Coronary Artery Bypass Grafting X 3 Using Left Internal  Mammary Artery, Right Internal Mammary Artery and Right Radial Artery   CORONARY STENT INTERVENTION N/A 02/15/2021   Procedure: CORONARY STENT INTERVENTION;  Surgeon: Yvonne Kendall, MD;  Location: [facility] INVASIVE CV LAB;  Service: Cardiovascular;  Laterality: N/A;   LYMPH NODE BIOPSY  1994   left neck   RADIAL ARTERY HARVEST  10/24/2011   Procedure: RADIAL ARTERY HARVEST;  Surgeon: Purcell Nails, MD;  Location: [facility] OR;  Service: Vascular;  Laterality: Left;   RIGHT/LEFT HEART CATH AND CORONARY/GRAFT ANGIOGRAPHY N/A 02/15/2021   Procedure: RIGHT/LEFT HEART CATH AND CORONARY/GRAFT ANGIOGRAPHY;  Surgeon: Yvonne Kendall, MD;  Location: [facility] INVASIVE CV LAB;  Service: Cardiovascular;  Laterality: N/A;    Current Medications: Current Meds  Medication Sig   dapagliflozin propanediol (FARXIGA) 10 MG TABS tablet Take 1 tablet (10 mg total) by mouth daily with breakfast.   eplerenone (INSPRA) 25 MG tablet Take 2 tablets (50 mg total) by mouth at bedtime.   icosapent Ethyl (VASCEPA) 1 g capsule TAKE TWO CAPSULES BY MOUTH TWICE A DAY   pantoprazole (PROTONIX) 40 MG tablet Take 40 mg by mouth in the morning.   senna (SENOKOT) 8.6 MG tablet Take 4 tablets by mouth daily with supper.   sodium zirconium cyclosilicate (LOKELMA) 10 g PACK packet Take 10 g by mouth daily.   vitamin B-12 (CYANOCOBALAMIN) 1000 MCG tablet Take 1 tablet (1,000 mcg total) by mouth daily.   [DISCONTINUED] bisoprolol (ZEBETA) 5 MG tablet Take 1 tablet (5 mg total) by mouth daily.   [DISCONTINUED] clopidogrel (PLAVIX) 75 MG tablet TAKE ONE TABLET BY MOUTH ONE TIME DAILY WITH BREAKFAST   [DISCONTINUED] Evolocumab (REPATHA SURECLICK) 140 MG/ML SOAJ INJECT 140MG  INTO THE SKIN EVERY 14 DAYS   [DISCONTINUED] furosemide (LASIX) 40 MG tablet Take 20 mg by mouth every other day.   [DISCONTINUED] sacubitril-valsartan (ENTRESTO) 24-26 MG Take 1 tablet by  mouth 2 (two) times daily.    Allergies:   Spironolactone   Social History   Socioeconomic History   Marital status: Married    Spouse name: Not on file   Number of children: Not on file   Years of education: Not on file   Highest education level: Not on file  Occupational History   Not on file  Tobacco Use   Smoking status: Former    Packs/day: .5    Types: Cigarettes    Quit date: 10/21/1996    Years since quitting: 25.6   Smokeless tobacco: Former    Types: Snuff    Quit date: 04/05/2019  Vaping Use   Vaping Use: Never used  Substance and Sexual Activity   Alcohol use: No   Drug use: No   Sexual activity: Yes  Other Topics Concern   Not on file  Social  History Narrative   Not on file   Social Determinants of Health   Financial Resource Strain: Low Risk  (04/18/2021)   Overall Financial Resource Strain (CARDIA)    Difficulty of Paying Living Expenses: Not hard at all  Food Insecurity: No Food Insecurity (04/18/2021)   Hunger Vital Sign    Worried About Running Out of Food in the Last Year: Never true    Ran Out of Food in the Last Year: Never true  Transportation Needs: No Transportation Needs (04/18/2021)   PRAPARE - Administrator, Civil Service (Medical): No    Lack of Transportation (Non-Medical): No  Physical Activity: Not on file  Stress: Not on file  Social Connections: Not on file     Family History:  The patient's family history includes Coronary artery disease in an other family member; Heart attack in his father; Heart disease in his mother.  ROS:   12-point review of systems is negative unless otherwise noted in the HPI.   EKGs/Labs/Other Studies Reviewed:    Studies reviewed were summarized above. The additional studies were reviewed today:  2D echo 11/07/2021: 1. Left ventricular ejection fraction, by estimation, is 35%. The left  ventricle has moderately decreased function. The left ventricle  demonstrates global hypokinesis.  Left ventricular diastolic parameters are  consistent with Grade II diastolic  dysfunction (pseudonormalization).   2. Right ventricular systolic function is mildly reduced. The right  ventricular size is normal. There is normal pulmonary artery systolic  pressure. The estimated right ventricular systolic pressure is 12.1 mmHg.   3. The mitral valve is normal in structure. Trivial mitral valve  regurgitation. No evidence of mitral stenosis.   4. The aortic valve is tricuspid. There is mild calcification of the  aortic valve. Aortic valve regurgitation is not visualized. No aortic  stenosis is present.   5. Aortic dilatation noted. There is mild dilatation of the aortic root,  measuring 37 mm.   6. The inferior vena cava is normal in size with greater than 50%  respiratory variability, suggesting right atrial pressure of 3 mmHg.  __________  2D echo 05/29/2021: 1. Left ventricular ejection fraction, by estimation, is 25 to 30%. The  left ventricle has severely decreased function. The left ventricle  demonstrates global hypokinesis with septal-lateral dyssynchrony. The left  ventricular internal cavity size was  mildly dilated. Left ventricular diastolic parameters are consistent with  Grade II diastolic dysfunction (pseudonormalization).   2. Right ventricular systolic function is mildly reduced. The right  ventricular size is normal. Tricuspid regurgitation signal is inadequate  for assessing PA pressure.   3. The mitral valve is normal in structure. Mild mitral valve  regurgitation. No evidence of mitral stenosis.   4. The aortic valve is tricuspid. Aortic valve regurgitation is trivial.   5. Aortic dilatation noted. There is mild dilatation of the aortic root,  measuring 38 mm.   6. The inferior vena cava is normal in size with greater than 50%  respiratory variability, suggesting right atrial pressure of 3 mmHg.  __________  [facility] 02/15/2021: Conclusions: Severe native coronary  artery disease, including 90% mid/distal LMCA stenosis, 50% ostial RCA stenosis, and a long segment of proximal through mid RCA disease of up to 90%. Widely patent LIMA-LAD and radial-OM2 bypass graft. Atretic/chronically occluded RIMA-RPDA graft. Mildly elevated left heart filling pressures. Severely elevated right heart filling pressures.  Equalization of end-diastolic pressures raises possibility of restrictive/constrictive physiology. Low normal to mildly reduced cardiac output. Successful PCI to  proximal-mid RCA using overlapping Onyx frontier 3.5 x 15 mm (proximal) and 3.0 x 38 mm (distal) drug-eluting stents with 0% residual stenosis and TIMI-3 flow.   Recommendations: Overnight observation. Dual antiplatelet therapy with aspirin and clopidogrel for at least 6 months. Consider escalation of diuresis as soon as tomorrow if renal function is stable. Optimize goal-directed medical therapy for chronic HFrEF, as tolerated. Aggressive secondary prevention of coronary artery disease. ___________   2D echo 02/08/2021: 1. Left ventricular ejection fraction, by estimation, is 20 to 25%. The  left ventricle has severely decreased function. The left ventricle  demonstrates global hypokinesis. The left ventricular internal cavity size  was mildly dilated. Left ventricular  diastolic parameters are consistent with Grade II diastolic dysfunction  (pseudonormalization). The average left ventricular global longitudinal  strain is -9.2 %. The global longitudinal strain is abnormal.   2. Right ventricular systolic function is normal. The right ventricular  size is normal. Tricuspid regurgitation signal is inadequate for assessing  PA pressure.   3. Left atrial size was moderately dilated.   4. The mitral valve is normal in structure. Mild to moderate mitral valve  regurgitation. No evidence of mitral stenosis.   5. The aortic valve is tricuspid. Aortic valve regurgitation is not  visualized. No  aortic stenosis is present.   6. There is borderline dilatation of the aortic root, measuring 39 mm.   7. The inferior vena cava is normal in size with greater than 50%  respiratory variability, suggesting right atrial pressure of 3 mmHg.   Comparison(s): LVEF 25-30%. ___________   2D echo 04/07/2019:  1. Left ventricular ejection fraction, by estimation, is 25 to 30%. The  left ventricle has severely decreased function. The left ventricle  demonstrates global hypokinesis. Left ventricular diastolic parameters are  consistent with Grade II diastolic  dysfunction (pseudonormalization).   2. Right ventricular systolic function is moderately reduced. The right  ventricular size is normal. There is normal pulmonary artery systolic  pressure.   3. The mitral valve is normal in structure. Trivial mitral valve  regurgitation.   4. The aortic valve is grossly normal. Aortic valve regurgitation is not  visualized.   5. Aortic dilatation noted. There is mild dilatation of the aortic root  measuring 39 mm.   6. The inferior vena cava is normal in size with greater than 50%  respiratory variability, suggesting right atrial pressure of 3 mmHg.   Comparison(s): EF 20-25%. ___________   2D echo 08/06/2016: - Left ventricle: The cavity size was normal. There was mild    concentric hypertrophy. Systolic function was severely reduced.    The estimated ejection fraction was in the range of 20% to 25%.    Diffuse hypokinesis. Hypokinesis of the anterior myocardium.    Hypokinesis of the anteroseptal myocardium. Hypokinesis of the    apical myocardium. Doppler parameters are consistent with    abnormal left ventricular relaxation (grade 1 diastolic    dysfunction).  - Aortic root: The aortic root was mildly dilated, 3.6 cm  - Mitral valve: There was mild regurgitation.  - Left atrium: The atrium was mildly dilated.  - Right ventricle: The cavity size was mildly dilated. Wall    thickness was normal.  Systolic function was moderately reduced.  - Pulmonary arteries: Systolic pressure could not be accurately    estimated. __________   Treadmill MPI 09/05/2015: Exercise myocardial perfusion imaging study with no significant  ischemia Fixed region of mildly decreased perfusion of mild to moderate intensity in the  mid to apical anterior wall, apical septal region and apical region Anteroseptal and septal wall hypokinesis, EF estimated at 33%  Abnormal wall motion possibly secondary to left bundle branch block Equivocal EKG changes concerning for ischemia at peak stress or in recovery. Unable to interpret EKG in the setting of left bundle Rare PVCs noted in recovery Target heart rate achieved, 7 METS, exercised for 5 minutes Low risk scan __________   [facility] 10/21/2011: Ostial left main 85% stenosis, proximal LAD 20% stenosis, D1 20% stenosis, proximal LCx 20% stenosis, mid LCx 50% stenosis, OM1 85% stenosis, ostial RCA 60% stenosis, proximal RCA 20% stenosis, mid RCA 20% stenosis, distal RCA 50% stenosis.  Recommendation consult for CABG.   EKG:  EKG is ordered today.  The EKG ordered today demonstrates A-sensed V-paced, 62 bpm  Recent Labs: 11/21/2021: Hemoglobin 16.0; NT-Pro BNP 489; Platelets 190 02/10/2022: B Natriuretic Peptide 175.5 02/14/2022: BUN 14; Creatinine, Ser 1.19; Potassium 5.0; Sodium 138  Recent Lipid Panel    Component Value Date/Time   CHOL 189 02/27/2021 1516   TRIG 291 (H) 02/27/2021 1516   HDL 32 (L) 02/27/2021 1516   CHOLHDL 5.9 (H) 02/27/2021 1516   CHOLHDL 9.4 10/23/2011 0550   VLDL 53 (H) 10/23/2011 0550   LDLCALC 107 (H) 02/27/2021 1516   LDLDIRECT 119 (H) 02/27/2021 1516    PHYSICAL EXAM:    VS:  BP 132/76 (BP Location: Left Arm, Patient Position: Sitting, Cuff Size: Normal)   Pulse 62   Ht 5\' 8"  (1.727 m)   Wt 219 lb (99.3 kg)   SpO2 97%   BMI 33.30 kg/m   BMI: Body mass index is 33.3 kg/m.  Physical Exam Vitals reviewed.  Constitutional:       Appearance: He is well-developed.  HENT:     Head: Normocephalic and atraumatic.  Eyes:     General:        Right eye: No discharge.        Left eye: No discharge.  Neck:     Vascular: No JVD.  Cardiovascular:     Rate and Rhythm: Normal rate and regular rhythm.     Pulses:          Posterior tibial pulses are 2+ on the right side and 2+ on the left side.     Heart sounds: Normal heart sounds, S1 normal and S2 normal. Heart sounds not distant. No midsystolic click and no opening snap. No murmur heard.    No friction rub.  Pulmonary:     Effort: Pulmonary effort is normal. No respiratory distress.     Breath sounds: Normal breath sounds. No decreased breath sounds, wheezing or rales.  Chest:     Chest wall: No tenderness.  Abdominal:     General: There is no distension.     Palpations: Abdomen is soft.     Tenderness: There is no abdominal tenderness.  Musculoskeletal:     Cervical back: Normal range of motion.     Right lower leg: No edema.     Left lower leg: No edema.  Skin:    General: Skin is warm and dry.     Nails: There is no clubbing.  Neurological:     Mental Status: He is alert and oriented to person, place, and time.  Psychiatric:        Speech: Speech normal.        Behavior: Behavior normal.        Thought Content: Thought content normal.  Judgment: Judgment normal.     Wt Readings from Last 3 Encounters:  05/27/22 219 lb (99.3 kg)  05/27/22 219 lb (99.3 kg)  02/10/22 225 lb 12.8 oz (102.4 kg)     ASSESSMENT & PLAN:   CAD s/p 3-vessel CABG s/p subsequent PCI without angina: He is doing well and without symptoms concerning for angina or cardiac decompensation.  Continue aggressive risk factor modification and secondary prevention including clopidogrel, Repatha, and Vascepa.  No indication for further ischemic testing at this time.  HFrEF secondary to ICM status post ICD: Euvolemic and well compensated with NYHA class I-II symptoms.  Continue  current GDMT including bisoprolol, Farxiga, eplerenone with Lokelma, Entresto, and furosemide at current doses.  History of orthostasis with dizziness precludes escalation of GDMT at this time.  Labs have been obtained through clinical research to assess BNP, renal function, and electrolytes.  These are pending at this time.  Schedule echo for 11/2022 with follow-up with the advanced heart failure clinic thereafter.  Follow-up with EP as directed this summer.  HTN: Blood pressure is well-controlled in the office today.  Continue medical therapy as outlined above.  HLD: LDL 16 in 05/2021.  He remains on Repatha and Vascepa.  Scheduled for fasting labs with PCP in the near future.  Target LDL less than 55 given history of CABG with subsequent PCI.  OSA: Intolerant to CPAP.    Disposition: F/u with Dr. Mariah Milling or an APP in 12 months, and advanced heart failure after echo in 11/2022/EP as directed in the summer of 2024.    Medication Adjustments/Labs and Tests Ordered: Current medicines are reviewed at length with the patient today.  Concerns regarding medicines are outlined above. Medication changes, Labs and Tests ordered today are summarized above and listed in the Patient Instructions accessible in Encounters.   Signed, Eula Listen, PA-C 05/27/2022 12:27 PM     [facility] [place] [address] [place], [place] [address] [phone]    CARDIAC PHYSICAL EXAMINATION        [] Not Done  Total JVP:  5 cm  (Normal = up to total of 7cm)   Peripheral Edema Grade: [x] 0  []  1   [] 2  [] 3 [] 4   PHYSICIANS DYSPNEA ASSESSMENT*                                 []  Not Done  Dyspnea [x] Yes [] No  NYHA Class [] 1    [x] 2     [] 3    []  4  Speech Dyspnea [] Yes [x] No  Paroxysmal Nocturnal Dyspnea  [] Yes [x] No  Orthopnea [] Yes [x] No  Pulmonary Congestion (if yes, check severe under 'Medical Condition' below) [] Yes [x] No  Pleural Fluid [] Right [] Left[] Both      [x] N/A  Wheezing  [] Yes [x] No  Severity of medical condition (select 'severe' if pulmonary congestion is present): [] Mild   [x] Moderate [] Severe  Comments: __________________________________________________________________________________________________________________________________________________________________________________________________________________________________________

## 2022-05-27 ENCOUNTER — Ambulatory Visit: Payer: Managed Care, Other (non HMO) | Attending: Physician Assistant | Admitting: Physician Assistant

## 2022-05-27 ENCOUNTER — Encounter: Payer: Self-pay | Admitting: Physician Assistant

## 2022-05-27 VITALS — BP 132/76 | HR 62 | Ht 68.0 in | Wt 219.0 lb

## 2022-05-27 VITALS — BP 132/76 | HR 62 | Wt 219.0 lb

## 2022-05-27 DIAGNOSIS — Z95810 Presence of automatic (implantable) cardiac defibrillator: Secondary | ICD-10-CM

## 2022-05-27 DIAGNOSIS — Z006 Encounter for examination for normal comparison and control in clinical research program: Secondary | ICD-10-CM

## 2022-05-27 DIAGNOSIS — Z951 Presence of aortocoronary bypass graft: Secondary | ICD-10-CM

## 2022-05-27 DIAGNOSIS — I2510 Atherosclerotic heart disease of native coronary artery without angina pectoris: Secondary | ICD-10-CM

## 2022-05-27 DIAGNOSIS — G4733 Obstructive sleep apnea (adult) (pediatric): Secondary | ICD-10-CM

## 2022-05-27 DIAGNOSIS — I5022 Chronic systolic (congestive) heart failure: Secondary | ICD-10-CM | POA: Diagnosis not present

## 2022-05-27 DIAGNOSIS — I255 Ischemic cardiomyopathy: Secondary | ICD-10-CM

## 2022-05-27 DIAGNOSIS — E785 Hyperlipidemia, unspecified: Secondary | ICD-10-CM

## 2022-05-27 DIAGNOSIS — I10 Essential (primary) hypertension: Secondary | ICD-10-CM

## 2022-05-27 MED ORDER — ROSUVASTATIN CALCIUM 40 MG PO TABS: 40.0000 mg | ORAL_TABLET | Freq: Every day | ORAL | 0 refills | Status: DC

## 2022-05-27 MED ORDER — FUROSEMIDE 40 MG PO TABS: 20.0000 mg | ORAL_TABLET | ORAL | 3 refills | Status: DC

## 2022-05-27 MED ORDER — REPATHA SURECLICK 140 MG/ML ~~LOC~~ SOAJ: SUBCUTANEOUS | 11 refills | Status: DC

## 2022-05-27 MED ORDER — BISOPROLOL FUMARATE 5 MG PO TABS: 5.0000 mg | ORAL_TABLET | Freq: Every day | ORAL | 3 refills | Status: DC

## 2022-05-27 MED ORDER — ENTRESTO 24-26 MG PO TABS
1.0000 | ORAL_TABLET | Freq: Two times a day (BID) | ORAL | 3 refills | Status: DC
Start: 2022-05-27 — End: 2022-12-19

## 2022-05-27 MED ORDER — CLOPIDOGREL BISULFATE 75 MG PO TABS: ORAL_TABLET | ORAL | 3 refills | Status: DC

## 2022-05-27 NOTE — Research (Signed)
DETECT 6 MONTH FOLLOW UP IN CLINIC   VISIT DETAILS  Date of Visit: 21/May/2024   NYHA  NYHA Class: [] 1 [x] 2 [] 3 [] 4    CARDIAC PHYSICAL EXAMINATION                                                                                                                    []  Not Done  Total JVP: 5 cm   (Normal = up to total of 7cm)   Peripheral Edema Grade: [x] 0 [] 1 [] 2 [] 3 [] 4    PHYSICIANS DYSPNEA ASSESSMENT      []  Not Done   Physicians Dyspnea Assessment: Dyspnea [x]   Yes []   No   Speech Dyspnea  []  Yes [x]   No   Paroxysmal Nocturnal Dyspnea   []  Yes [x]   No   Orthopnea  []  Yes [x]  No   Pulmonary Congestion    (check severe in Q2 below)  []  Yes [x]   No   Pleural Fluid  []  Right []   Left []   Both        [x]   N/A   Wheezing  []  Yes [x]   No  Medical Condition:  []  Mild [x]  Moderate  []  Severe  Comments:    CLINICAL LABORATORY TESTS - CHEMISTRY & HEMATOLOGY            []  Not Done  Can be taken up to 1 month before visit, or at physician's discretion.  Please attach all relevant laboratory reports as required. If any results are considered clinically significant, please mark as such and report in medical history (if related to a pre-existent condition) or as an adverse event (if new finding).   CORDIO RECORDING EQUIPMENT  Was there a change in equipment supplied by the Sponsor since last visit?     []   Yes      [x]   No  New Phone/Tablet serial number: ____________________________________________   New Phone/Tablet Model: __________________________________________________    SMOKING HISTORY  Has the subject smoked tobacco since the last visit? []  Yes  [x]   No  Does the subject currently smoke tobacco?  []  Yes [x]   No  If yes, how many packs per week? _________   CONCOMITANT MEDICATIONS  Have there been any changes to the subject's medications since last visit?  If yes, please update Concomitant Medications Log.  [x]  Yes []   No   ADVERSE EVENTS  Have any new adverse events (AEs)  occurred since last visit?  If yes, please update Adverse Events Log. []  Yes  [x]  No     Current Outpatient Medications:    bisoprolol (ZEBETA) 5 MG tablet, Take 1 tablet (5 mg total) by mouth daily., Disp: 90 tablet, Rfl: 3   clopidogrel (PLAVIX) 75 MG tablet, TAKE ONE TABLET BY MOUTH ONE TIME DAILY WITH BREAKFAST, Disp: 90 tablet, Rfl: 3   colchicine 0.6 MG tablet, Take 0.6 mg by mouth daily as needed (GOUT FLARES). (Patient not taking: Reported on 05/27/2022), Disp: , Rfl:    dapagliflozin propanediol (  FARXIGA) 10 MG TABS tablet, Take 1 tablet (10 mg total) by mouth daily with breakfast., Disp: 90 tablet, Rfl: 3   eplerenone (INSPRA) 25 MG tablet, Take 2 tablets (50 mg total) by mouth at bedtime., Disp: 60 tablet, Rfl: 11   Evolocumab (REPATHA SURECLICK) 140 MG/ML SOAJ, INJECT 140MG  INTO THE SKIN EVERY 14 DAYS, Disp: 2 mL, Rfl: 11   furosemide (LASIX) 40 MG tablet, Take 0.5 tablets (20 mg total) by mouth every other day., Disp: 30 tablet, Rfl: 3   icosapent Ethyl (VASCEPA) 1 g capsule, TAKE TWO CAPSULES BY MOUTH TWICE A DAY, Disp: 120 capsule, Rfl: 11   pantoprazole (PROTONIX) 40 MG tablet, Take 40 mg by mouth in the morning., Disp: , Rfl:    rosuvastatin (CRESTOR) 40 MG tablet, Take 1 tablet (40 mg total) by mouth daily. No further refills until seen in office. Thank you, Disp: 15 tablet, Rfl: 0   sacubitril-valsartan (ENTRESTO) 24-26 MG, Take 1 tablet by mouth 2 (two) times daily., Disp: 180 tablet, Rfl: 3   senna (SENOKOT) 8.6 MG tablet, Take 4 tablets by mouth daily with supper., Disp: , Rfl:    sodium zirconium cyclosilicate (LOKELMA) 10 g PACK packet, Take 10 g by mouth daily., Disp: 30 packet, Rfl: 11   vitamin B-12 (CYANOCOBALAMIN) 1000 MCG tablet, Take 1 tablet (1,000 mcg total) by mouth daily., Disp: 30 tablet, Rfl: 1

## 2022-05-27 NOTE — Patient Instructions (Signed)
Medication Instructions:  No changes at this time.   *If you need a refill on your cardiac medications before your next appointment, please call your pharmacy*   Lab Work: None  If you have labs (blood work) drawn today and your tests are completely normal, you will receive your results only by: MyChart Message (if you have MyChart) OR A paper copy in the mail If you have any lab test that is abnormal or we need to change your treatment, we will call you to review the results.   Testing/Procedures: Your physician has requested that you have an echocardiogram in [place]. Echocardiography is a painless test that uses sound waves to create images of your heart. It provides your doctor with information about the size and shape of your heart and how well your heart's chambers and valves are working. This procedure takes approximately one hour. There are no restrictions for this procedure. Please do NOT wear cologne, perfume, aftershave, or lotions (deodorant is allowed). Please arrive 15 minutes prior to your appointment time.    Follow-Up: At [facility], you and your health needs are our priority.  As part of our continuing mission to provide you with exceptional heart care, we have created designated Provider Care Teams.  These Care Teams include your primary Cardiologist (physician) and Advanced Practice Providers (APPs -  Physician Assistants and Nurse Practitioners) who all work together to provide you with the care you need, when you need it.   Your next appointment:   1 year(s)  Provider:   Julien Nordmann, MD or Eula Listen, PA-C    Other Instructions Follow up with Dr. Shirlee Latch upstairs in November AFTER echocardiogram has been done.

## 2022-05-28 LAB — BASIC METABOLIC PANEL
BUN/Creatinine Ratio: 10 (ref 9–20)
BUN: 13 mg/dL (ref 6–24)
CO2: 24 mmol/L (ref 20–29)
Calcium: 9.6 mg/dL (ref 8.7–10.2)
Chloride: 100 mmol/L (ref 96–106)
Creatinine, Ser: 1.26 mg/dL (ref 0.76–1.27)
Glucose: 68 mg/dL — ABNORMAL LOW (ref 70–99)
Potassium: 4.9 mmol/L (ref 3.5–5.2)
Sodium: 141 mmol/L (ref 134–144)
eGFR: 66 mL/min/{1.73_m2} (ref >59)

## 2022-05-28 LAB — CBC
Hematocrit: 50.4 % (ref 37.5–51.0)
Hemoglobin: 16.8 g/dL (ref 13.0–17.7)
MCH: 29.4 pg (ref 26.6–33.0)
MCHC: 33.3 g/dL (ref 31.5–35.7)
MCV: 88 fL (ref 79–97)
Platelets: 219 10*3/uL (ref 150–450)
RBC: 5.72 x10E6/uL (ref 4.14–5.80)
RDW: 13.2 % (ref 11.6–15.4)
WBC: 6.6 10*3/uL (ref 3.4–10.8)

## 2022-05-28 LAB — PRO B NATRIURETIC PEPTIDE: NT-Pro BNP: 206 pg/mL (ref 0–210)

## 2022-05-28 NOTE — Progress Notes (Signed)
Labs drawn for Detect 6 month f/u. Please review, thank you!

## 2022-07-11 ENCOUNTER — Ambulatory Visit (INDEPENDENT_AMBULATORY_CARE_PROVIDER_SITE_OTHER): Payer: Managed Care, Other (non HMO)

## 2022-07-11 DIAGNOSIS — I255 Ischemic cardiomyopathy: Secondary | ICD-10-CM | POA: Diagnosis not present

## 2022-07-11 LAB — CUP PACEART REMOTE DEVICE CHECK
Battery Remaining Longevity: 78 mo
Battery Remaining Percentage: 81 %
Battery Voltage: 3.02 V
Brady Statistic AP VP Percent: 25 %
Brady Statistic AP VS Percent: 1 %
Brady Statistic AS VP Percent: 75 %
Brady Statistic AS VS Percent: 1 %
Brady Statistic RA Percent Paced: 25 %
Date Time Interrogation Session: 20240705020016
HighPow Impedance: 69 Ohm
HighPow Impedance: 69 Ohm
Implantable Lead Connection Status: 753985
Implantable Lead Connection Status: 753985
Implantable Lead Connection Status: 753985
Implantable Lead Implant Date: 20230703
Implantable Lead Implant Date: 20230703
Implantable Lead Implant Date: 20230703
Implantable Lead Location: 753858
Implantable Lead Location: 753859
Implantable Lead Location: 753860
Implantable Lead Model: 3830
Implantable Lead Model: 7122
Implantable Pulse Generator Implant Date: 20230703
Lead Channel Impedance Value: 410 Ohm
Lead Channel Impedance Value: 610 Ohm
Lead Channel Impedance Value: 690 Ohm
Lead Channel Pacing Threshold Amplitude: 0.25 V
Lead Channel Pacing Threshold Amplitude: 0.25 V
Lead Channel Pacing Threshold Amplitude: 0.5 V
Lead Channel Pacing Threshold Pulse Width: 0.05 ms
Lead Channel Pacing Threshold Pulse Width: 0.5 ms
Lead Channel Pacing Threshold Pulse Width: 0.5 ms
Lead Channel Sensing Intrinsic Amplitude: 11.9 mV
Lead Channel Sensing Intrinsic Amplitude: 2.9 mV
Lead Channel Setting Pacing Amplitude: 0.25 V
Lead Channel Setting Pacing Amplitude: 2 V
Lead Channel Setting Pacing Amplitude: 2 V
Lead Channel Setting Pacing Pulse Width: 0.05 ms
Lead Channel Setting Pacing Pulse Width: 0.5 ms
Lead Channel Setting Sensing Sensitivity: 0.5 mV
Pulse Gen Serial Number: 8906747

## 2022-07-15 ENCOUNTER — Encounter: Payer: Managed Care, Other (non HMO) | Admitting: Physician Assistant

## 2022-07-17 NOTE — Progress Notes (Signed)
  Electrophysiology Office Note:   Date:  07/21/2022  ID:  Willie [name], DOB Sep 25, 1965, MRN [id number]  Primary Cardiologist: Julien Nordmann, MD Electrophysiologist: Sherryl Manges, MD     History of Present Illness:   Willie [name] is a 57 y.o. male with h/o Hodgkin's Lymphoma s/p XRT to neck/mid chest, HTN, HLD, CAD s/p CABGx3, ICM s/p ICD-CRT seen today for routine electrophysiology followup.   He was last seen in EP Clinic in 10/2021 by Dr. Graciela Husbands.  He was changed from coreg to bisoprolol due to fatigue at that time. He reports feeling some better in terms of activity tolerance after the BB change.  Notes he still has some difficulty climbing up a hill or stairs.   Since last being seen in our clinic the patient reports doing well.  He denies chest pain, palpitations, dyspnea, PND, orthopnea, nausea, vomiting, dizziness, syncope, edema, weight gain, or early satiety.   Review of systems complete and found to be negative unless listed in HPI.    EP information / Studies Reviewed:    EKG is not ordered today. EKG from 05/27/22 reviewed which showed AV paced 60 bpm      ICD Interrogation-  reviewed in detail today,  See PACEART report.  Device History: Abbott BiV ICD implanted 07/09/2022 for LBBB in setting of persistent LV dysfunction with mixed ischemic / non-ischemic cardiomyopathy.  LV lead implantation was attempted--but while the orifice was able to be cannulated, there are 2 valves in the posterior proximal portion and a very serpentine section past which wire was unable to pass.  History of AAD therapy: No   Studies:  ECHO 11/2021 > LVEF 35%, LV moderately decreased function, global hypokinesis, grade II diastolic dysfunction, RV systolic function mildly reduced, normal PASP, trivial MVR  Risk Assessment/Calculations:              Physical Exam:   VS:  BP 120/76   Pulse 68   Ht 5\' 8"  (1.727 m)   Wt 222 lb (100.7 kg)   SpO2 98%   BMI 33.75 kg/m    Wt Readings from  Last 3 Encounters:  07/21/22 222 lb (100.7 kg)  05/27/22 219 lb (99.3 kg)  05/27/22 219 lb (99.3 kg)     GEN: pleasant, well nourished, well developed adult male in no acute distress NECK: No JVD; No carotid bruits CARDIAC: Regular rate and rhythm, no murmurs, rubs, gallops RESPIRATORY:  Clear to auscultation without rales, wheezing or rhonchi  ABDOMEN: Soft, non-tender, non-distended EXTREMITIES:  No edema; No deformity   ASSESSMENT AND PLAN:    Chronic Systolic Dysfunction s/p Abbott CRT-D (L Bundle / Conduction System Pacing) LBBB Euvolemic today Stable on an appropriate medical regimen: farxiga, entresto, bisoprolol, eplerenone Normal ICD function See Pace Art report No changes today  HTN -meds as above   CAD s/p CABG x3 HLD -crestor  Hx Hodgkin's Disease s/p Mantle Radiation & Chemotherapy  -per ONC  Disposition:   Follow up with Dr. Graciela Husbands in 12 months   Signed, Canary Brim, MSN, APRN, NP-C, AGACNP-BC [facility] - Electrophysiology  07/21/2022, 8:36 AM

## 2022-07-21 ENCOUNTER — Ambulatory Visit: Payer: Managed Care, Other (non HMO) | Attending: Physician Assistant | Admitting: Pulmonary Disease

## 2022-07-21 ENCOUNTER — Encounter: Payer: Self-pay | Admitting: Student

## 2022-07-21 VITALS — BP 120/76 | HR 68 | Ht 68.0 in | Wt 222.0 lb

## 2022-07-21 DIAGNOSIS — I10 Essential (primary) hypertension: Secondary | ICD-10-CM

## 2022-07-21 DIAGNOSIS — I5022 Chronic systolic (congestive) heart failure: Secondary | ICD-10-CM | POA: Diagnosis not present

## 2022-07-21 DIAGNOSIS — Z951 Presence of aortocoronary bypass graft: Secondary | ICD-10-CM | POA: Diagnosis not present

## 2022-07-21 DIAGNOSIS — Z95810 Presence of automatic (implantable) cardiac defibrillator: Secondary | ICD-10-CM

## 2022-07-21 DIAGNOSIS — I447 Left bundle-branch block, unspecified: Secondary | ICD-10-CM

## 2022-07-21 DIAGNOSIS — E785 Hyperlipidemia, unspecified: Secondary | ICD-10-CM

## 2022-07-21 LAB — CUP PACEART INCLINIC DEVICE CHECK
Battery Remaining Longevity: 78 mo
Brady Statistic RA Percent Paced: 25 %
Brady Statistic RV Percent Paced: 99.95 %
Date Time Interrogation Session: 20240715101023
HighPow Impedance: 70.875
Implantable Lead Connection Status: 753985
Implantable Lead Connection Status: 753985
Implantable Lead Connection Status: 753985
Implantable Lead Implant Date: 20230703
Implantable Lead Implant Date: 20230703
Implantable Lead Implant Date: 20230703
Implantable Lead Location: 753858
Implantable Lead Location: 753859
Implantable Lead Location: 753860
Implantable Lead Model: 3830
Implantable Lead Model: 7122
Implantable Pulse Generator Implant Date: 20230703
Lead Channel Impedance Value: 412.5 Ohm
Lead Channel Impedance Value: 587.5 Ohm
Lead Channel Impedance Value: 737.5 Ohm
Lead Channel Pacing Threshold Amplitude: 0.75 V
Lead Channel Pacing Threshold Amplitude: 0.75 V
Lead Channel Pacing Threshold Amplitude: 0.75 V
Lead Channel Pacing Threshold Amplitude: 0.75 V
Lead Channel Pacing Threshold Pulse Width: 0.5 ms
Lead Channel Pacing Threshold Pulse Width: 0.5 ms
Lead Channel Pacing Threshold Pulse Width: 0.5 ms
Lead Channel Pacing Threshold Pulse Width: 0.5 ms
Lead Channel Sensing Intrinsic Amplitude: 11.9 mV
Lead Channel Sensing Intrinsic Amplitude: 3.3 mV
Lead Channel Setting Pacing Amplitude: 0.25 V
Lead Channel Setting Pacing Amplitude: 2 V
Lead Channel Setting Pacing Amplitude: 2 V
Lead Channel Setting Pacing Pulse Width: 0.05 ms
Lead Channel Setting Pacing Pulse Width: 0.5 ms
Lead Channel Setting Sensing Sensitivity: 0.5 mV
Pulse Gen Serial Number: 8906747

## 2022-07-21 NOTE — Patient Instructions (Signed)
Medication Instructions:  Your physician recommends that you continue on your current medications as directed. Please refer to the Current Medication list given to you today.  *If you need a refill on your cardiac medications before your next appointment, please call your pharmacy*  Lab Work: None ordered If you have labs (blood work) drawn today and your tests are completely normal, you will receive your results only by: MyChart Message (if you have MyChart) OR A paper copy in the mail If you have any lab test that is abnormal or we need to change your treatment, we will call you to review the results.  Follow-Up: At [facility], you and your health needs are our priority.  As part of our continuing mission to provide you with exceptional heart care, we have created designated Provider Care Teams.  These Care Teams include your primary Cardiologist (physician) and Advanced Practice Providers (APPs -  Physician Assistants and Nurse Practitioners) who all work together to provide you with the care you need, when you need it.  Your next appointment:   1 year(s)  Provider:   Steven Klein, MD  

## 2022-07-25 NOTE — Progress Notes (Signed)
Remote ICD transmission.   

## 2022-08-04 ENCOUNTER — Encounter: Payer: Self-pay | Admitting: Cardiovascular Disease

## 2022-08-05 LAB — COLOGUARD: COLOGUARD: POSITIVE — AB

## 2022-09-05 DIAGNOSIS — Z006 Encounter for examination for normal comparison and control in clinical research program: Secondary | ICD-10-CM

## 2022-09-05 NOTE — Research (Signed)
DETECT FOLLOW UP VISIT via PHONE - MONTH 9 (+/- 2 weeks)  VISIT DETAILS  Date of Visit: 30/Aug/2024   NYHA  NYHA Class: [] 1 [x] 2 [] 3 [] 4    CORDIO RECORDING EQUIPMENT*  Was there a change in equipment supplied by the Sponsor since last visit?      []  Yes       [x]   No  New Phone/Tablet serial number: ____________   New Phone/Tablet Model: __________    SMOKING HISTORY  Has the subject smoked tobacco since the last visit?  []   Yes  [x]   No  Does the subject currently smoke tobacco?  []   Yes  [x]   No  If yes, how many packs per week? _________   CONCOMITANT MEDICATIONS  Have there been any changes to the subject's medications since last visit?  If yes, please update Concomitant Medications Log.  []  Yes  [x]   No   ADVERSE EVENTS  Have any new adverse events (AEs) occurred since last visit?  If yes, please update Adverse Events Log.  []  Yes  [x]   No   ELECTROCARDIOGRAM*                                                                                                                                       [x]  Not Done Information will be extracted from the subject's most recent ECG in case one was conducted since the last CHF clinic visit.  ECHOCARDIOGRAPHY*                             *Upload Echocardiogram in [facility]                                                   [x]  Not Done Information will be extracted from the subjects most recent Echocardiogram in case one was conducted since the last CHF clinic visit.    Current Outpatient Medications:    bisoprolol (ZEBETA) 5 MG tablet, Take 1 tablet (5 mg total) by mouth daily., Disp: 90 tablet, Rfl: 3   clopidogrel (PLAVIX) 75 MG tablet, TAKE ONE TABLET BY MOUTH ONE TIME DAILY WITH BREAKFAST, Disp: 90 tablet, Rfl: 3   colchicine 0.6 MG tablet, Take 0.6 mg by mouth daily as needed (GOUT FLARES)., Disp: , Rfl:    dapagliflozin propanediol (FARXIGA) 10 MG TABS tablet, Take 1 tablet (10 mg total) by mouth daily with breakfast., Disp: 90 tablet,  Rfl: 3   eplerenone (INSPRA) 25 MG tablet, Take 2 tablets (50 mg total) by mouth at bedtime., Disp: 60 tablet, Rfl: 11   Evolocumab (REPATHA SURECLICK) 140 MG/ML SOAJ, INJECT 140MG  INTO THE SKIN EVERY 14 DAYS, Disp: 2 mL, Rfl: 11   furosemide (LASIX) 40  MG tablet, Take 0.5 tablets (20 mg total) by mouth every other day., Disp: 30 tablet, Rfl: 3   icosapent Ethyl (VASCEPA) 1 g capsule, TAKE TWO CAPSULES BY MOUTH TWICE A DAY, Disp: 120 capsule, Rfl: 11   pantoprazole (PROTONIX) 40 MG tablet, Take 40 mg by mouth in the morning., Disp: , Rfl:    rosuvastatin (CRESTOR) 40 MG tablet, Take 1 tablet (40 mg total) by mouth daily. No further refills until seen in office. Thank you, Disp: 15 tablet, Rfl: 0   sacubitril-valsartan (ENTRESTO) 24-26 MG, Take 1 tablet by mouth 2 (two) times daily., Disp: 180 tablet, Rfl: 3   senna (SENOKOT) 8.6 MG tablet, Take 4 tablets by mouth daily with supper., Disp: , Rfl:    sodium zirconium cyclosilicate (LOKELMA) 10 g PACK packet, Take 10 g by mouth daily., Disp: 30 packet, Rfl: 11   vitamin B-12 (CYANOCOBALAMIN) 1000 MCG tablet, Take 1 tablet (1,000 mcg total) by mouth daily., Disp: 30 tablet, Rfl: 1

## 2022-10-10 ENCOUNTER — Ambulatory Visit: Payer: Managed Care, Other (non HMO)

## 2022-10-10 DIAGNOSIS — I447 Left bundle-branch block, unspecified: Secondary | ICD-10-CM

## 2022-10-11 LAB — CUP PACEART REMOTE DEVICE CHECK
Battery Remaining Longevity: 76 mo
Battery Remaining Percentage: 79 %
Battery Voltage: 3.01 V
Brady Statistic AP VP Percent: 27 %
Brady Statistic AP VS Percent: 1 %
Brady Statistic AS VP Percent: 73 %
Brady Statistic AS VS Percent: 1 %
Brady Statistic RA Percent Paced: 26 %
Date Time Interrogation Session: 20241004020018
HighPow Impedance: 70 Ohm
HighPow Impedance: 70 Ohm
Implantable Lead Connection Status: 753985
Implantable Lead Connection Status: 753985
Implantable Lead Connection Status: 753985
Implantable Lead Implant Date: 20230703
Implantable Lead Implant Date: 20230703
Implantable Lead Implant Date: 20230703
Implantable Lead Location: 753858
Implantable Lead Location: 753859
Implantable Lead Location: 753860
Implantable Lead Model: 3830
Implantable Lead Model: 7122
Implantable Pulse Generator Implant Date: 20230703
Lead Channel Impedance Value: 450 Ohm
Lead Channel Impedance Value: 560 Ohm
Lead Channel Impedance Value: 710 Ohm
Lead Channel Pacing Threshold Amplitude: 0.5 V
Lead Channel Pacing Threshold Amplitude: 0.75 V
Lead Channel Pacing Threshold Amplitude: 0.75 V
Lead Channel Pacing Threshold Pulse Width: 0.05 ms
Lead Channel Pacing Threshold Pulse Width: 0.5 ms
Lead Channel Pacing Threshold Pulse Width: 0.5 ms
Lead Channel Sensing Intrinsic Amplitude: 11.9 mV
Lead Channel Sensing Intrinsic Amplitude: 3 mV
Lead Channel Setting Pacing Amplitude: 0.25 V
Lead Channel Setting Pacing Amplitude: 2 V
Lead Channel Setting Pacing Amplitude: 2 V
Lead Channel Setting Pacing Pulse Width: 0.05 ms
Lead Channel Setting Pacing Pulse Width: 0.5 ms
Lead Channel Setting Sensing Sensitivity: 0.5 mV
Pulse Gen Serial Number: 8906747

## 2022-10-23 NOTE — Progress Notes (Signed)
Remote ICD transmission.   

## 2022-10-28 ENCOUNTER — Encounter: Payer: Self-pay | Admitting: Cardiovascular Disease

## 2022-11-05 ENCOUNTER — Other Ambulatory Visit: Payer: Self-pay

## 2022-11-05 ENCOUNTER — Encounter: Payer: Self-pay | Admitting: Cardiovascular Disease

## 2022-11-05 MED ORDER — REPATHA SURECLICK 140 MG/ML ~~LOC~~ SOAJ: SUBCUTANEOUS | 11 refills | Status: DC

## 2022-11-17 ENCOUNTER — Encounter: Payer: Self-pay | Admitting: Cardiovascular Disease

## 2022-11-18 ENCOUNTER — Ambulatory Visit: Payer: Managed Care, Other (non HMO) | Attending: Physician Assistant

## 2022-11-18 DIAGNOSIS — I255 Ischemic cardiomyopathy: Secondary | ICD-10-CM | POA: Diagnosis not present

## 2022-11-18 LAB — ECHOCARDIOGRAM COMPLETE
AR max vel: 2.82 cm2
AV Area VTI: 2.7 cm2
AV Area mean vel: 2.58 cm2
AV Mean grad: 2 mm[Hg]
AV Peak grad: 4.2 mm[Hg]
Ao pk vel: 1.03 m/s
Area-P 1/2: 3.65 cm2
Calc EF: 42.8 %
S' Lateral: 3.8 cm
Single Plane A2C EF: 44.1 %
Single Plane A4C EF: 42.5 %

## 2022-12-18 ENCOUNTER — Ambulatory Visit (HOSPITAL_COMMUNITY)
Admission: RE | Admit: 2022-12-18 | Discharge: 2022-12-18 | Disposition: A | Discharge status: Home / Self Care | Payer: Managed Care, Other (non HMO) | Source: Ambulatory Visit | Attending: Physician Assistant | Admitting: Physician Assistant

## 2022-12-18 VITALS — BP 128/82 | HR 71 | Wt 216.6 lb

## 2022-12-18 DIAGNOSIS — I2510 Atherosclerotic heart disease of native coronary artery without angina pectoris: Secondary | ICD-10-CM | POA: Diagnosis not present

## 2022-12-18 DIAGNOSIS — I255 Ischemic cardiomyopathy: Secondary | ICD-10-CM | POA: Insufficient documentation

## 2022-12-18 DIAGNOSIS — I5022 Chronic systolic (congestive) heart failure: Secondary | ICD-10-CM | POA: Insufficient documentation

## 2022-12-18 DIAGNOSIS — Z95810 Presence of automatic (implantable) cardiac defibrillator: Secondary | ICD-10-CM | POA: Diagnosis not present

## 2022-12-18 DIAGNOSIS — Z79899 Other long term (current) drug therapy: Secondary | ICD-10-CM | POA: Diagnosis not present

## 2022-12-18 DIAGNOSIS — Z7902 Long term (current) use of antithrombotics/antiplatelets: Secondary | ICD-10-CM | POA: Diagnosis not present

## 2022-12-18 DIAGNOSIS — Z951 Presence of aortocoronary bypass graft: Secondary | ICD-10-CM | POA: Diagnosis not present

## 2022-12-18 DIAGNOSIS — Z7984 Long term (current) use of oral hypoglycemic drugs: Secondary | ICD-10-CM | POA: Insufficient documentation

## 2022-12-18 DIAGNOSIS — G4733 Obstructive sleep apnea (adult) (pediatric): Secondary | ICD-10-CM | POA: Diagnosis not present

## 2022-12-18 DIAGNOSIS — E782 Mixed hyperlipidemia: Secondary | ICD-10-CM

## 2022-12-18 DIAGNOSIS — E785 Hyperlipidemia, unspecified: Secondary | ICD-10-CM | POA: Insufficient documentation

## 2022-12-18 LAB — COMPREHENSIVE METABOLIC PANEL
ALT: 23 U/L (ref 0–44)
AST: 24 U/L (ref 15–41)
Albumin: 3.8 g/dL (ref 3.5–5.0)
Alkaline Phosphatase: 79 U/L (ref 38–126)
Anion gap: 9 (ref 5–15)
BUN: 11 mg/dL (ref 6–20)
CO2: 24 mmol/L (ref 22–32)
Calcium: 9.3 mg/dL (ref 8.9–10.3)
Chloride: 106 mmol/L (ref 98–111)
Creatinine, Ser: 1.14 mg/dL (ref 0.61–1.24)
GFR, Estimated: >60 mL/min (ref >60)
Glucose, Bld: 91 mg/dL (ref 70–99)
Potassium: 4.8 mmol/L (ref 3.5–5.1)
Sodium: 139 mmol/L (ref 135–145)
Total Bilirubin: 0.8 mg/dL (ref <1.2)
Total Protein: 7.3 g/dL (ref 6.5–8.1)

## 2022-12-18 LAB — BRAIN NATRIURETIC PEPTIDE: B Natriuretic Peptide: 104.4 pg/mL — ABNORMAL HIGH (ref 0.0–100.0)

## 2022-12-18 NOTE — Progress Notes (Signed)
PCP: No primary care provider on file. Cardiology: Dr. Mariah Milling HF Cardiology: Dr. Shirlee Latch  57 y.o. with history of CAD s/p CABG, ischemic cardiomyopathy/HFrEF, and prior Hodgkins lymphoma.  Patient had Hodgkins disease diagnosed in 1994 and was treated with chemotherapy and radiation.  A strong family history of CAD and also the chest wall radiation with Hodgkins treatment likely predisposed him to premature CAD.  He had CABG in 2013. Echo at the time of CABG showed EF 30%.  EF has been low since that time except for cMRI done in 2/19 which showed EF 43%.  Echo in 2/23 showed EF 20-25%, normal RV.  He had worsening dyspnea as well as chest pain in 1/23 and 2/23.  Cath revealed 90% proximal-mid RCA stenosis with atretic RIMA-PDA.  This was treated with DES x 2.  RHC showed prominent RV failure.    Echo in 5/23: EF 25-30% with septal-lateral dyssynchrony and mild RV dysfunction. Patient had St Jude ICD with left bundle lead placed.    Echo 11/23: EF 35%, mild RV dysfunction.   Follow up 11/23, feeling better since device was reprogrammed at last visit with Dr. Graciela Husbands.  Bisoprolol increased to 5 mg daily.     Last seen for follow-up 02/24. NYHA I-II symptoms. Not volume overloaded. Restarted Eplerenone.   Echo 11/24: EF 35-40%  Here today for CHF follow-up. Reports chronic dyspnea with exertion, seems to be worse last few months. Gets winded quickly walking up an incline or climbing stairs. No orthopnea, PND or lower extremity edema. Denies angina. Patient and his wife believe he may have some anxiety/depression related to the complexity of his medical comorbidities. Previously very active prior to development of cardiac issues. Now on disability.  No ETOH or tobacco use.    ECG (personally reviewed): none ordered today.  St Jude device interrogation: > 99% left bundle pacing, no AF or VT, thoracic impedance has been up and down, currently stable above threshold  PMH: 1. Hodgkins disease: Treated  with chemotherapy and chest radiation in 1994.  2. CAD: CABG x 3 in 2013 with LIMA-LAD, RIMA-RCA, free radial-OM2.  - LHC (2/23): 90% proximal-mid RCA stenosis with atretic RIMA-RCA, patient had DES x 2 to proximal and mid RCA.  3. H/o COVID 4. Hyperlipidemia 5. Gout 6. Chronic systolic CHF: Ischemic cardiomyopathy.  He has a [employer] ICD with left bundle lead.  - Echo (10/13): EF 30% - Echo (2018): EF 20-25%, moderate RV dysfunction.  - Cardiac MRI (2/19): EF 43%, minimal coronary pattern LGE.   - Echo (4/21): EF 20-25%, normal RV - Echo (2/23): EF 20-25%, normal RV - RHC (2/23): mean RA 18, PA 33/18 mean 23, PAPI < 1, CI 2.0 Fick/2.5 thermo.  - Echo (5/23): EF 25-30% with septal-lateral dyssynchrony and mild RV dysfunction. - Echo (11/23): EF 35%, mildly decreased RV systolic function.  - Echo (11/24): EF 35-40%, RV okay 7. OSA: Moderate.   Social History   Socioeconomic History   Marital status: Married    Spouse name: Not on file   Number of children: Not on file   Years of education: Not on file   Highest education level: Not on file  Occupational History   Not on file  Tobacco Use   Smoking status: Former    Current packs/day: 0.00    Types: Cigarettes    Quit date: 10/21/1996    Years since quitting: 26.1   Smokeless tobacco: Former    Types: Snuff    Quit date:  04/05/2019  Vaping Use   Vaping status: Never Used  Substance and Sexual Activity   Alcohol use: No   Drug use: No   Sexual activity: Yes  Other Topics Concern   Not on file  Social History Narrative   Not on file   Social Drivers of Health   Financial Resource Strain: Low Risk  (07/23/2022)   Received from [facility]   Overall Financial Resource Strain (CARDIA)    Difficulty of Paying Living Expenses: Not hard at all  Food Insecurity: No Food Insecurity (07/23/2022)   Received from [facility]   Hunger Vital Sign    Worried About Running Out of Food in the Last  Year: Never true    Ran Out of Food in the Last Year: Never true  Transportation Needs: No Transportation Needs (07/23/2022)   Received from [facility] - Transportation    In the past 12 months, has lack of transportation kept you from medical appointments or from getting medications?: No    Lack of Transportation (Non-Medical): No  Physical Activity: Not on file  Stress: Not on file  Social Connections: Not on file  Intimate Partner Violence: Not on file   Family History  Problem Relation Age of Onset   Coronary artery disease Other    Heart disease Mother    Heart attack Father    ROS: All systems reviewed and negative except as per HPI.   Current Outpatient Medications  Medication Sig Dispense Refill   bisoprolol (ZEBETA) 5 MG tablet Take 1 tablet (5 mg total) by mouth daily. 90 tablet 3   clopidogrel (PLAVIX) 75 MG tablet TAKE ONE TABLET BY MOUTH ONE TIME DAILY WITH BREAKFAST 90 tablet 3   colchicine 0.6 MG tablet Take 0.6 mg by mouth daily as needed (GOUT FLARES).     dapagliflozin propanediol (FARXIGA) 10 MG TABS tablet Take 1 tablet (10 mg total) by mouth daily with breakfast. 90 tablet 3   eplerenone (INSPRA) 25 MG tablet Take 2 tablets (50 mg total) by mouth at bedtime. 60 tablet 11   Evolocumab (REPATHA SURECLICK) 140 MG/ML SOAJ INJECT 140MG  INTO THE SKIN EVERY 14 DAYS 2 mL 11   furosemide (LASIX) 40 MG tablet Take 0.5 tablets (20 mg total) by mouth every other day. 30 tablet 3   icosapent Ethyl (VASCEPA) 1 g capsule TAKE TWO CAPSULES BY MOUTH TWICE A DAY 120 capsule 11   pantoprazole (PROTONIX) 40 MG tablet Take 40 mg by mouth in the morning.     rosuvastatin (CRESTOR) 40 MG tablet Take 1 tablet (40 mg total) by mouth daily. No further refills until seen in office. Thank you 15 tablet 0   sacubitril-valsartan (ENTRESTO) 24-26 MG Take 1 tablet by mouth 2 (two) times daily. 180 tablet 3   senna (SENOKOT) 8.6 MG tablet Take 4 tablets by mouth daily  with supper.     sodium zirconium cyclosilicate (LOKELMA) 10 g PACK packet Take 10 g by mouth daily. 30 packet 11   vitamin B-12 (CYANOCOBALAMIN) 1000 MCG tablet Take 1 tablet (1,000 mcg total) by mouth daily. 30 tablet 1   No current facility-administered medications for this encounter.   Wt Readings from Last 3 Encounters:  12/18/22 98.2 kg (216 lb 9.6 oz)  07/21/22 100.7 kg (222 lb)  05/27/22 99.3 kg (219 lb)   BP 128/82   Pulse 71   Wt 98.2 kg (216 lb 9.6 oz)   SpO2  97%   BMI 32.93 kg/m  General:  Well appearing.  HEENT: normal Neck: supple. no JVD.  Cor: PMI nondisplaced. Regular rate & rhythm. No rubs, gallops or murmurs. Lungs: clear Abdomen: soft, nontender, nondistended.  Extremities: no cyanosis, clubbing, rash, edema Neuro: alert & orientedx3. Affect pleasant   Assessment/Plan: 1. CAD: s/p CABG in 2013 and DES x 2 to native RCA in 2/23 with atretic RIMA-PDA noted. No further chest pain since PCI. See discussion below regarding dyspnea. - Now on Plavix 75 mg daily - Continue Crestor and Repatha, LDL at goal (36) in 07/24 2. Hyperlipidemia: He is now on Repatha, Crestor, Vascepa.  3. Chronic systolic CHF: Ischemic cardiomyopathy. Echo in 2/23 with EF 20-25%, normal RV.  RHC in 2/23 showed prominent RV failure with PAPI < 1.  Echo in 5/23 showed EF 25-30% with septal-lateral dyssynchrony and mild RV dysfunction.  St Jude ICD with left bundle lead was placed.  Echo 11/23 showed EF slightly higher at 35%.  Echo 11/24: EF 35-40%, RV okay. - NYHA class early III. More dyspnea over the last few months. Volume looks good on exam and by device.  May be d/t deconditioning, has not been very active since completing cardiac rehab. He is planning to start silver sneakers exercise program at [facility] in January. If symptoms worsen or fail to improve with exercise regimen, will arrange for [facility] to assess volume status/hemodynamics as well as coronary/vein graft anatomy. May  eventually need to consider PFTs d/t prior history of chest radiation. He will call before follow-up with any concerns. - Continue 20 mg lasix every other day. - Continue Eplerenone 50 mg daily and Lokelma 10 g daily - Continue bisoprolol 5 mg daily.    - Continue Entresto 24/26 bid, he was lightheaded when we tried to increase.  - Continue Farxiga 10 mg daily. 4. OSA: Unable to tolerate CPAP. 5. Suspected anxiety/depression: Encouraged him to discuss with PCP, he reports follow-up scheduled next month.  Follow up 3 months with Dr. Shirlee Latch at [facility] PA-C 12/18/2022

## 2022-12-18 NOTE — Patient Instructions (Signed)
Routine lab work today. Will notify you of abnormal results  Follow up with Dr.McLean in 3 months at [facility]  Do the following things EVERYDAY: Weigh yourself in the morning before breakfast. Write it down and keep it in a log. Take your medicines as prescribed Eat low salt foods--Limit salt (sodium) to 2000 mg per day.  Stay as active as you can everyday Limit all fluids for the day to less than 2 liters

## 2022-12-19 ENCOUNTER — Encounter: Payer: Self-pay | Admitting: Cardiovascular Disease

## 2022-12-19 DIAGNOSIS — I255 Ischemic cardiomyopathy: Secondary | ICD-10-CM

## 2022-12-19 MED ORDER — ENTRESTO 24-26 MG PO TABS
1.0000 | ORAL_TABLET | Freq: Two times a day (BID) | ORAL | Status: DC
Start: 2022-12-19 — End: 2023-01-21

## 2023-01-08 ENCOUNTER — Encounter (HOSPITAL_COMMUNITY): Payer: Self-pay | Admitting: Cardiology

## 2023-01-08 ENCOUNTER — Encounter: Payer: Self-pay | Admitting: Cardiology

## 2023-01-09 ENCOUNTER — Encounter: Payer: Self-pay | Admitting: Cardiology

## 2023-01-12 ENCOUNTER — Encounter (HOSPITAL_COMMUNITY): Payer: Self-pay

## 2023-01-12 ENCOUNTER — Ambulatory Visit (INDEPENDENT_AMBULATORY_CARE_PROVIDER_SITE_OTHER): Payer: Managed Care, Other (non HMO)

## 2023-01-12 DIAGNOSIS — I255 Ischemic cardiomyopathy: Secondary | ICD-10-CM

## 2023-01-12 DIAGNOSIS — I5022 Chronic systolic (congestive) heart failure: Secondary | ICD-10-CM

## 2023-01-14 LAB — CUP PACEART REMOTE DEVICE CHECK
Battery Remaining Longevity: 73 mo
Battery Remaining Percentage: 76 %
Battery Voltage: 3.01 V
Brady Statistic AP VP Percent: 23 %
Brady Statistic AP VS Percent: 1 %
Brady Statistic AS VP Percent: 77 %
Brady Statistic AS VS Percent: 1 %
Brady Statistic RA Percent Paced: 23 %
Date Time Interrogation Session: 20250106152930
HighPow Impedance: 73 Ohm
HighPow Impedance: 73 Ohm
Implantable Lead Connection Status: 753985
Implantable Lead Connection Status: 753985
Implantable Lead Connection Status: 753985
Implantable Lead Implant Date: 20230703
Implantable Lead Implant Date: 20230703
Implantable Lead Implant Date: 20230703
Implantable Lead Location: 753858
Implantable Lead Location: 753859
Implantable Lead Location: 753860
Implantable Lead Model: 3830
Implantable Lead Model: 7122
Implantable Pulse Generator Implant Date: 20230703
Lead Channel Impedance Value: 480 Ohm
Lead Channel Impedance Value: 560 Ohm
Lead Channel Impedance Value: 640 Ohm
Lead Channel Pacing Threshold Amplitude: 0.5 V
Lead Channel Pacing Threshold Amplitude: 0.75 V
Lead Channel Pacing Threshold Amplitude: 0.75 V
Lead Channel Pacing Threshold Pulse Width: 0.05 ms
Lead Channel Pacing Threshold Pulse Width: 0.5 ms
Lead Channel Pacing Threshold Pulse Width: 0.5 ms
Lead Channel Sensing Intrinsic Amplitude: 11.9 mV
Lead Channel Sensing Intrinsic Amplitude: 2.8 mV
Lead Channel Setting Pacing Amplitude: 0.25 V
Lead Channel Setting Pacing Amplitude: 2 V
Lead Channel Setting Pacing Amplitude: 2 V
Lead Channel Setting Pacing Pulse Width: 0.05 ms
Lead Channel Setting Pacing Pulse Width: 0.5 ms
Lead Channel Setting Sensing Sensitivity: 0.5 mV
Pulse Gen Serial Number: 8906747

## 2023-01-15 ENCOUNTER — Encounter: Payer: Self-pay | Admitting: Cardiovascular Disease

## 2023-01-16 ENCOUNTER — Other Ambulatory Visit: Payer: Self-pay

## 2023-01-16 DIAGNOSIS — Z125 Encounter for screening for malignant neoplasm of prostate: Secondary | ICD-10-CM | POA: Diagnosis not present

## 2023-01-16 DIAGNOSIS — I5022 Chronic systolic (congestive) heart failure: Secondary | ICD-10-CM | POA: Diagnosis not present

## 2023-01-16 DIAGNOSIS — R7309 Other abnormal glucose: Secondary | ICD-10-CM | POA: Diagnosis not present

## 2023-01-16 DIAGNOSIS — Z8571 Personal history of Hodgkin lymphoma: Secondary | ICD-10-CM | POA: Diagnosis not present

## 2023-01-16 DIAGNOSIS — Z951 Presence of aortocoronary bypass graft: Secondary | ICD-10-CM | POA: Diagnosis not present

## 2023-01-16 DIAGNOSIS — I2510 Atherosclerotic heart disease of native coronary artery without angina pectoris: Secondary | ICD-10-CM | POA: Diagnosis not present

## 2023-01-16 MED ORDER — EPLERENONE 25 MG PO TABS: 50.0000 mg | ORAL_TABLET | Freq: Every day | ORAL | 11 refills | Status: DC

## 2023-01-21 ENCOUNTER — Encounter: Payer: Self-pay | Admitting: Cardiovascular Disease

## 2023-01-21 DIAGNOSIS — I255 Ischemic cardiomyopathy: Secondary | ICD-10-CM

## 2023-01-21 MED ORDER — ENTRESTO 24-26 MG PO TABS: 1.0000 | ORAL_TABLET | Freq: Two times a day (BID) | ORAL | 1 refills | Status: DC

## 2023-01-22 ENCOUNTER — Other Ambulatory Visit: Payer: Self-pay

## 2023-01-22 ENCOUNTER — Encounter: Payer: Self-pay | Admitting: Cardiology

## 2023-01-22 DIAGNOSIS — I5022 Chronic systolic (congestive) heart failure: Secondary | ICD-10-CM

## 2023-01-22 MED ORDER — LOKELMA 10 G PO PACK: 10.0000 g | PACK | Freq: Every day | ORAL | 11 refills | Status: DC

## 2023-01-23 ENCOUNTER — Telehealth: Payer: Self-pay | Admitting: Pharmacy Technician

## 2023-01-23 ENCOUNTER — Other Ambulatory Visit (HOSPITAL_COMMUNITY): Payer: Self-pay

## 2023-01-23 DIAGNOSIS — I2510 Atherosclerotic heart disease of native coronary artery without angina pectoris: Secondary | ICD-10-CM | POA: Diagnosis not present

## 2023-01-23 DIAGNOSIS — Z951 Presence of aortocoronary bypass graft: Secondary | ICD-10-CM | POA: Diagnosis not present

## 2023-01-23 DIAGNOSIS — G4709 Other insomnia: Secondary | ICD-10-CM | POA: Diagnosis not present

## 2023-01-23 DIAGNOSIS — C8199 Hodgkin lymphoma, unspecified, extranodal and solid organ sites: Secondary | ICD-10-CM | POA: Diagnosis not present

## 2023-01-23 DIAGNOSIS — Z8571 Personal history of Hodgkin lymphoma: Secondary | ICD-10-CM | POA: Diagnosis not present

## 2023-01-23 DIAGNOSIS — Z8739 Personal history of other diseases of the musculoskeletal system and connective tissue: Secondary | ICD-10-CM | POA: Diagnosis not present

## 2023-01-23 DIAGNOSIS — Z0000 Encounter for general adult medical examination without abnormal findings: Secondary | ICD-10-CM | POA: Diagnosis not present

## 2023-01-23 DIAGNOSIS — I5022 Chronic systolic (congestive) heart failure: Secondary | ICD-10-CM | POA: Diagnosis not present

## 2023-01-23 DIAGNOSIS — I10 Essential (primary) hypertension: Secondary | ICD-10-CM | POA: Diagnosis not present

## 2023-01-23 NOTE — Telephone Encounter (Signed)
Pharmacy Patient Advocate Encounter   Received notification from Patient Advice Request messages that prior authorization for Inspra is required/requested.   Insurance verification completed.   The patient is insured through [facility] ADVANTAGE/RX ADVANCE .   Per test claim: The current 01/23/23 day co-pay is, $0.00 one month  .  No PA needed at this time. This test claim was processed through [facility]- copay amounts may vary at other pharmacies due to pharmacy/plan contracts, or as the patient moves through the different stages of their insurance plan.

## 2023-01-27 ENCOUNTER — Encounter: Payer: Self-pay | Admitting: Cardiology

## 2023-01-28 ENCOUNTER — Telehealth: Payer: Self-pay | Admitting: Pharmacist

## 2023-01-28 ENCOUNTER — Other Ambulatory Visit (HOSPITAL_COMMUNITY): Payer: Self-pay

## 2023-01-28 ENCOUNTER — Telehealth: Payer: Self-pay

## 2023-01-28 ENCOUNTER — Other Ambulatory Visit: Payer: Self-pay

## 2023-01-28 DIAGNOSIS — I5022 Chronic systolic (congestive) heart failure: Secondary | ICD-10-CM

## 2023-01-28 MED ORDER — SODIUM POLYSTYRENE SULFONATE PO POWD: Freq: Once | ORAL | 1 refills | Status: AC

## 2023-01-28 NOTE — Telephone Encounter (Signed)
Patinet is unable to afford $100 copay for [facility] now that he has Medicare. Patient willing to swap to SPS 15g daily for $47 dollars per month. Prescription sent. Will need to repeat labs in ~1 week to ensure potassium remains at target with medication change.

## 2023-01-28 NOTE — Telephone Encounter (Signed)
Patient Advocate Encounter  Test claims were processed and returned the following results that were communicated to care team:  Lokelma 10 g - refill too soon   Lokelma 5 g - $100 for 30 days / $250 for 90 days   SPS (15 g daily) $47 for 30 days / $117.50 for 90 days  Burnell Blanks, CPhT Rx Patient Advocate Phone: [phone]

## 2023-01-28 NOTE — Telephone Encounter (Signed)
Hey Nason, Could you assist with this? Pt unable to afford lokelma.

## 2023-02-04 ENCOUNTER — Encounter: Payer: Self-pay | Admitting: Internal Medicine

## 2023-02-08 ENCOUNTER — Encounter: Payer: Self-pay | Admitting: Cardiology

## 2023-02-09 ENCOUNTER — Encounter: Payer: Self-pay | Admitting: Cardiology

## 2023-02-09 ENCOUNTER — Telehealth: Payer: Self-pay

## 2023-02-09 ENCOUNTER — Telehealth: Payer: Self-pay | Admitting: Pharmacy Technician

## 2023-02-09 ENCOUNTER — Other Ambulatory Visit (HOSPITAL_COMMUNITY): Payer: Self-pay

## 2023-02-09 NOTE — Telephone Encounter (Signed)
Pharmacy Patient Advocate Encounter  Received notification from [facility] ADVANTAGE/RX ADVANCE that Prior Authorization for repatha has been APPROVED from 02/09/23 to 08/09/23   PA #/Case ID/Reference #: [id number]

## 2023-02-09 NOTE — Telephone Encounter (Signed)
Authorization for repatha

## 2023-02-09 NOTE — Telephone Encounter (Signed)
Pharmacy Patient Advocate Encounter   Received notification from Patient Advice Request messages that prior authorization for repatha is required/requested.   Insurance verification completed.   The patient is insured through [facility] ADVANTAGE/RX ADVANCE .   Per test claim: PA required; PA submitted to above mentioned insurance via CoverMyMeds Key/confirmation #/EOC [id number] Status is pending

## 2023-02-09 NOTE — Telephone Encounter (Signed)
Patient Advocate Encounter  Test claims for Repatha and Praluent both return a copay of $47 for 28 day supply on the current insurance plan.  Burnell Blanks, CPhT Rx Patient Advocate Phone: [phone]

## 2023-02-18 ENCOUNTER — Other Ambulatory Visit: Payer: Self-pay | Admitting: Cardiology

## 2023-02-23 NOTE — Addendum Note (Signed)
Addended by: Geralyn Flash D on: 02/23/2023 03:59 PM   Modules accepted: Orders

## 2023-02-23 NOTE — Progress Notes (Signed)
 Remote ICD transmission.

## 2023-03-09 ENCOUNTER — Telehealth: Payer: Self-pay | Admitting: Cardiology

## 2023-03-09 ENCOUNTER — Encounter: Payer: Managed Care, Other (non HMO) | Admitting: Cardiology

## 2023-03-09 DIAGNOSIS — R195 Other fecal abnormalities: Secondary | ICD-10-CM | POA: Diagnosis not present

## 2023-03-09 DIAGNOSIS — Z7901 Long term (current) use of anticoagulants: Secondary | ICD-10-CM | POA: Diagnosis not present

## 2023-03-09 NOTE — Telephone Encounter (Signed)
Patient called back and confirmed his appointment for tomorrow

## 2023-03-09 NOTE — Telephone Encounter (Signed)
 Lvm to confirm appt for 03/10/23

## 2023-03-10 ENCOUNTER — Telehealth: Payer: Self-pay | Admitting: *Deleted

## 2023-03-10 ENCOUNTER — Ambulatory Visit: Payer: Self-pay | Attending: Cardiology | Admitting: Cardiology

## 2023-03-10 VITALS — BP 132/85 | HR 65 | Wt 223.0 lb

## 2023-03-10 DIAGNOSIS — I5022 Chronic systolic (congestive) heart failure: Secondary | ICD-10-CM | POA: Diagnosis not present

## 2023-03-10 MED ORDER — FUROSEMIDE 40 MG PO TABS: 20.0000 mg | ORAL_TABLET | ORAL | 3 refills | Status: AC | PRN

## 2023-03-10 MED ORDER — SACUBITRIL-VALSARTAN 49-51 MG PO TABS: 1.0000 | ORAL_TABLET | Freq: Two times a day (BID) | ORAL | 11 refills | Status: DC

## 2023-03-10 NOTE — Telephone Encounter (Signed)
   Patient Name: Willie [name]  DOB: 05-23-1965 MRN: [id number]  Primary Cardiologist: Julien Nordmann, MD  Chart reviewed as part of pre-operative protocol coverage. Given past medical history and time since last visit, based on ACC/AHA guidelines, Willie [name] is at acceptable risk for the planned procedure without further cardiovascular testing.   Patient was seen in office on 03/10/2023 by Dr. Shirlee Latch.  Per Dr. Shirlee Latch, "OK to hold plavix as requested for colonoscopy."  Please resume Plavix as soon as possible postprocedure, at the discretion of the surgeon.   I will route this recommendation to the requesting party via Epic fax function and remove from pre-op pool.  Please call with questions.  Joylene Grapes, NP 03/10/2023, 4:24 PM

## 2023-03-10 NOTE — Patient Instructions (Signed)
 CHANGE Entresto to 49/51 mg Twice daily  CHANGE Lasix to as needed for weight gain of 3lb in 24 hours or 5 lb in a week.  Go DOWN to LOWER LEVEL (LL) to have your blood work completed inside of [employer] office.  Go over to the MEDICAL MALL. Go pass the gift shop and have your blood work completed in 10 days  We will only call you if the results are abnormal or if the provider would like to make medication changes.  Your physician recommends that you schedule a follow-up appointment in: 2 months ( May) ** PLEASE CALL THE OFFICE IN APRIL TO ARRANGE YOUR FOLLOW UP APPOINTMENT.**  At the [facility], you and your health needs are our priority. As part of our continuing mission to provide you with exceptional heart care, we have created designated Provider Care Teams. These Care Teams include your primary Cardiologist (physician) and Advanced Practice Providers (APPs- Physician Assistants and Nurse Practitioners) who all work together to provide you with the care you need, when you need it.   You may see any of the following providers on your designated Care Team at your next follow up: Dr Arvilla Meres Dr Marca Ancona Dr. Dorthula Nettles Dr. Clearnce Hasten Amy Filbert Schilder, NP Robbie Lis, [place] [facility] [place], [place] Brynda Peon, NP [place] Lee, NP Clarisa Kindred, NP Karle Plumber, PharmD Enos Fling, PharmD   Please be sure to bring in all your medications bottles to every appointment.    Thank you for choosing [facility]

## 2023-03-10 NOTE — Progress Notes (Signed)
 PCP: No primary care provider on file. HF Cardiology: Dr. Shirlee Latch  Chief complaint: CHF  58 y.o. with history of CAD s/p CABG, ischemic cardiomyopathy/HFrEF, and prior Hodgkins lymphoma.  Patient had Hodgkins disease diagnosed in 1994 and was treated with chemotherapy and radiation.  A strong family history of CAD and also the chest wall radiation with Hodgkins treatment likely predisposed him to premature CAD.  He had CABG in 2013. Echo at the time of CABG showed EF 30%.  EF has been low since that time except for cMRI done in 2/19 which showed EF 43%.  Echo in 2/23 showed EF 20-25%, normal RV.  He had worsening dyspnea as well as chest pain in 1/23 and 2/23.  Cath revealed 90% proximal-mid RCA stenosis with atretic RIMA-PDA.  This was treated with DES x 2.  RHC showed prominent RV failure.    Echo in 5/23: EF 25-30% with septal-lateral dyssynchrony and mild RV dysfunction. Patient had St Jude ICD with left bundle lead placed.    Echo 11/23: EF 35%, mild RV dysfunction.   Echo 11/24: EF 35-40%  Here today for CHF follow-up. He is feeling somewhat better than at last appointment.  He is going to the gym regularly.  He does not note dyspnea walking on treadmill or using the elliptical at the gym.  He goes 3 days/week.  He is short of breath walking up a hill, no dyspnea walking on flat ground.  He is short of breath walking up stairs if he walks fast. No orthopnea/PND.  No chest pain. No lightheadedness.    Labs (12/24): BNP 104 Labs (1/25): LDL 37, K 4.7, creatinine 1.1, LFTs normal, hgb 15.7  PMH: 1. Hodgkins disease: Treated with chemotherapy and chest radiation in 1994.  2. CAD: CABG x 3 in 2013 with LIMA-LAD, RIMA-RCA, free radial-OM2.  - LHC (2/23): 90% proximal-mid RCA stenosis with atretic RIMA-RCA, patient had DES x 2 to proximal and mid RCA.  3. H/o COVID 4. Hyperlipidemia 5. Gout 6. Chronic systolic CHF: Ischemic cardiomyopathy.  He has a [employer] ICD with left bundle lead.  - Echo  (10/13): EF 30% - Echo (2018): EF 20-25%, moderate RV dysfunction.  - Cardiac MRI (2/19): EF 43%, minimal coronary pattern LGE.   - Echo (4/21): EF 20-25%, normal RV - Echo (2/23): EF 20-25%, normal RV - RHC (2/23): mean RA 18, PA 33/18 mean 23, PAPI < 1, CI 2.0 Fick/2.5 thermo.  - Echo (5/23): EF 25-30% with septal-lateral dyssynchrony and mild RV dysfunction. - Echo (11/23): EF 35%, mildly decreased RV systolic function.  - Echo (11/24): EF 35-40%, RV okay 7. OSA: Moderate.   Social History   Socioeconomic History   Marital status: Married    Spouse name: Not on file   Number of children: Not on file   Years of education: Not on file   Highest education level: Not on file  Occupational History   Not on file  Tobacco Use   Smoking status: Former    Current packs/day: 0.00    Types: Cigarettes    Quit date: 10/21/1996    Years since quitting: 26.4   Smokeless tobacco: Former    Types: Snuff    Quit date: 04/05/2019  Vaping Use   Vaping status: Never Used  Substance and Sexual Activity   Alcohol use: No   Drug use: No   Sexual activity: Yes  Other Topics Concern   Not on file  Social History Narrative   Not on  file   Social Drivers of Health   Financial Resource Strain: Low Risk  (07/23/2022)   Received from [facility]   Overall Financial Resource Strain (CARDIA)    Difficulty of Paying Living Expenses: Not hard at all  Food Insecurity: No Food Insecurity (07/23/2022)   Received from [facility]   Hunger Vital Sign    Worried About Running Out of Food in the Last Year: Never true    Ran Out of Food in the Last Year: Never true  Transportation Needs: No Transportation Needs (07/23/2022)   Received from [facility] - Transportation    In the past 12 months, has lack of transportation kept you from medical appointments or from getting medications?: No    Lack of Transportation (Non-Medical): No  Physical  Activity: Not on file  Stress: Not on file  Social Connections: Not on file  Intimate Partner Violence: Not on file   Family History  Problem Relation Age of Onset   Coronary artery disease Other    Heart disease Mother    Heart attack Father    ROS: All systems reviewed and negative except as per HPI.   Current Outpatient Medications  Medication Sig Dispense Refill   bisoprolol (ZEBETA) 5 MG tablet Take 1 tablet (5 mg total) by mouth daily. 90 tablet 3   clopidogrel (PLAVIX) 75 MG tablet TAKE ONE TABLET BY MOUTH ONE TIME DAILY WITH BREAKFAST 90 tablet 3   colchicine 0.6 MG tablet Take 0.6 mg by mouth daily as needed (GOUT FLARES).     dapagliflozin propanediol (FARXIGA) 10 MG TABS tablet Take 1 tablet (10 mg total) by mouth daily with breakfast. 90 tablet 3   eplerenone (INSPRA) 25 MG tablet Take 2 tablets (50 mg total) by mouth at bedtime. 60 tablet 11   Evolocumab (REPATHA SURECLICK) 140 MG/ML SOAJ INJECT 140MG  INTO THE SKIN EVERY 14 DAYS 2 mL 11   icosapent Ethyl (VASCEPA) 1 g capsule TAKE 2 CAPSULES BY MOUTH TWICE A DAY. 120 capsule 11   pantoprazole (PROTONIX) 40 MG tablet Take 40 mg by mouth in the morning.     rosuvastatin (CRESTOR) 40 MG tablet Take 1 tablet (40 mg total) by mouth daily. No further refills until seen in office. Thank you 15 tablet 0   sacubitril-valsartan (ENTRESTO) 49-51 MG Take 1 tablet by mouth 2 (two) times daily. 60 tablet 11   senna (SENOKOT) 8.6 MG tablet Take 4 tablets by mouth daily with supper.     sodium polystyrene (KAYEXALATE) 15 GM/60ML suspension Take 15 g by mouth as needed.     traZODone (DESYREL) 50 MG tablet Take 50 mg by mouth at bedtime.     vitamin B-12 (CYANOCOBALAMIN) 1000 MCG tablet Take 1 tablet (1,000 mcg total) by mouth daily. 30 tablet 1   furosemide (LASIX) 40 MG tablet Take 0.5 tablets (20 mg total) by mouth as needed. For weight gain of 3lb in 24 hours or 5 lb in a week 30 tablet 3   No current facility-administered medications  for this visit.   Wt Readings from Last 3 Encounters:  03/10/23 223 lb (101.2 kg)  12/18/22 216 lb 9.6 oz (98.2 kg)  07/21/22 222 lb (100.7 kg)   BP 132/85   Pulse 65   Wt 223 lb (101.2 kg)   SpO2 98%   BMI 33.91 kg/m  General: NAD Neck: No JVD, no thyromegaly or thyroid nodule.  Lungs: Clear to auscultation  bilaterally with normal respiratory effort. CV: Nondisplaced PMI.  Heart regular S1/S2, no S3/S4, no murmur.  No peripheral edema.  No carotid bruit.  Normal pedal pulses.  Abdomen: Soft, nontender, no hepatosplenomegaly, no distention.  Skin: Intact without lesions or rashes.  Neurologic: Alert and oriented x 3.  Psych: Normal affect. Extremities: No clubbing or cyanosis.  HEENT: Normal.   Assessment/Plan: 1. CAD: s/p CABG in 2013 and DES x 2 to native RCA in 2/23 with atretic RIMA-PDA noted. No chest pain.  - Now on Plavix 75 mg daily - Continue Crestor and Repatha, good LDL in 1/25.  2. Hyperlipidemia: He is now on Repatha, Crestor, Vascepa.  3. Chronic systolic CHF: Ischemic cardiomyopathy. Echo in 2/23 with EF 20-25%, normal RV.  RHC in 2/23 showed prominent RV failure with PAPI < 1.  Echo in 5/23 showed EF 25-30% with septal-lateral dyssynchrony and mild RV dysfunction.  St Jude ICD with left bundle lead was placed.  Echo 11/23 showed EF slightly higher at 35%.  Echo 11/24 with EF 35-40%, RV okay.  NYHA class II, not volume overloaded on exam.  Symptoms somewhat improved since he has started regular exercise at the gym.  - Increase Entresto to 49/51 bid and change Lasix to prn.  BMET/BNP today, BMET in 10 days.  - Continue Eplerenone 50 mg daily and Kayexalate 15 g daily (Lokelma too expensive).  - Continue bisoprolol 5 mg daily.    - Continue Farxiga 10 mg daily. 4. OSA: Unable to tolerate CPAP.  Follow up 2 months with Dr. Shirlee Latch at [facility]   I spent 31 minutes reviewing records, interviewing/examining patient, and managing orders.   Marca Ancona   03/10/2023

## 2023-03-10 NOTE — Telephone Encounter (Signed)
   Pre-operative Risk Assessment    Patient Name: Willie [name]  DOB: April 05, 1965 MRN: [id number]   Date of last office visit: 03/10/23 DR. McLEAN Date of next office visit: NONE   Request for Surgical Clearance    Procedure:   COLONOSCOPY  Date of Surgery:  Clearance 04/10/23                                Surgeon:  DR. Elfredia Nevins Surgeon's Group or Practice Name:  [facility] Phone number:  [phone] Fax number:  [phone]   Type of Clearance Requested:   - Medical  - Pharmacy:  Hold Clopidogrel (Plavix) x 5 DAYS PRIOR   Type of Anesthesia:  Not Indicated   Additional requests/questions:    Elpidio Anis   03/10/2023, 12:21 PM

## 2023-03-11 ENCOUNTER — Encounter: Payer: Self-pay | Admitting: Cardiology

## 2023-03-12 LAB — BASIC METABOLIC PANEL
BUN/Creatinine Ratio: 12 (ref 9–20)
BUN: 15 mg/dL (ref 6–24)
CO2: 22 mmol/L (ref 20–29)
Calcium: 9.9 mg/dL (ref 8.7–10.2)
Chloride: 102 mmol/L (ref 96–106)
Creatinine, Ser: 1.29 mg/dL — ABNORMAL HIGH (ref 0.76–1.27)
Glucose: 92 mg/dL (ref 70–99)
Potassium: 5 mmol/L (ref 3.5–5.2)
Sodium: 140 mmol/L (ref 134–144)
eGFR: 64 mL/min/{1.73_m2} (ref >59)

## 2023-03-12 LAB — BRAIN NATRIURETIC PEPTIDE: BNP: 59.9 pg/mL (ref 0.0–100.0)

## 2023-04-04 ENCOUNTER — Encounter: Payer: Self-pay | Admitting: Cardiology

## 2023-04-07 ENCOUNTER — Other Ambulatory Visit
Admission: RE | Admit: 2023-04-07 | Discharge: 2023-04-07 | Disposition: A | Discharge status: Home / Self Care | Attending: Cardiology | Admitting: Cardiology

## 2023-04-07 DIAGNOSIS — I5022 Chronic systolic (congestive) heart failure: Secondary | ICD-10-CM | POA: Insufficient documentation

## 2023-04-07 LAB — BASIC METABOLIC PANEL WITH GFR
Anion gap: 6 (ref 5–15)
BUN: 12 mg/dL (ref 6–20)
CO2: 26 mmol/L (ref 22–32)
Calcium: 9.2 mg/dL (ref 8.9–10.3)
Chloride: 106 mmol/L (ref 98–111)
Creatinine, Ser: 1.3 mg/dL — ABNORMAL HIGH (ref 0.61–1.24)
GFR, Estimated: >60 mL/min (ref >60)
Glucose, Bld: 109 mg/dL — ABNORMAL HIGH (ref 70–99)
Potassium: 4.7 mmol/L (ref 3.5–5.1)
Sodium: 138 mmol/L (ref 135–145)

## 2023-04-09 ENCOUNTER — Encounter: Payer: Self-pay | Admitting: Gastroenterology

## 2023-04-10 ENCOUNTER — Encounter
Admission: RE | Disposition: A | Discharge status: Home / Self Care | Payer: Self-pay | Source: Home / Self Care | Attending: Gastroenterology

## 2023-04-10 ENCOUNTER — Ambulatory Visit
Admission: RE | Admit: 2023-04-10 | Discharge: 2023-04-10 | Disposition: A | Discharge status: Home / Self Care | Payer: Self-pay | Attending: Gastroenterology | Admitting: Gastroenterology

## 2023-04-10 ENCOUNTER — Ambulatory Visit: Admitting: Anesthesiology

## 2023-04-10 ENCOUNTER — Other Ambulatory Visit: Payer: Self-pay

## 2023-04-10 ENCOUNTER — Encounter: Payer: Self-pay | Admitting: Gastroenterology

## 2023-04-10 DIAGNOSIS — Z7982 Long term (current) use of aspirin: Secondary | ICD-10-CM | POA: Insufficient documentation

## 2023-04-10 DIAGNOSIS — Z7902 Long term (current) use of antithrombotics/antiplatelets: Secondary | ICD-10-CM | POA: Diagnosis not present

## 2023-04-10 DIAGNOSIS — R195 Other fecal abnormalities: Secondary | ICD-10-CM | POA: Insufficient documentation

## 2023-04-10 DIAGNOSIS — D124 Benign neoplasm of descending colon: Secondary | ICD-10-CM | POA: Diagnosis not present

## 2023-04-10 DIAGNOSIS — Z1211 Encounter for screening for malignant neoplasm of colon: Secondary | ICD-10-CM | POA: Insufficient documentation

## 2023-04-10 DIAGNOSIS — I509 Heart failure, unspecified: Secondary | ICD-10-CM | POA: Diagnosis not present

## 2023-04-10 DIAGNOSIS — Z7984 Long term (current) use of oral hypoglycemic drugs: Secondary | ICD-10-CM | POA: Diagnosis not present

## 2023-04-10 DIAGNOSIS — Z79899 Other long term (current) drug therapy: Secondary | ICD-10-CM | POA: Insufficient documentation

## 2023-04-10 DIAGNOSIS — I447 Left bundle-branch block, unspecified: Secondary | ICD-10-CM | POA: Diagnosis not present

## 2023-04-10 DIAGNOSIS — I5022 Chronic systolic (congestive) heart failure: Secondary | ICD-10-CM | POA: Insufficient documentation

## 2023-04-10 DIAGNOSIS — I110 Hypertensive heart disease with heart failure: Secondary | ICD-10-CM | POA: Diagnosis not present

## 2023-04-10 DIAGNOSIS — K635 Polyp of colon: Secondary | ICD-10-CM | POA: Diagnosis not present

## 2023-04-10 DIAGNOSIS — G4730 Sleep apnea, unspecified: Secondary | ICD-10-CM | POA: Insufficient documentation

## 2023-04-10 DIAGNOSIS — I2510 Atherosclerotic heart disease of native coronary artery without angina pectoris: Secondary | ICD-10-CM | POA: Insufficient documentation

## 2023-04-10 DIAGNOSIS — K621 Rectal polyp: Secondary | ICD-10-CM | POA: Insufficient documentation

## 2023-04-10 DIAGNOSIS — Z87891 Personal history of nicotine dependence: Secondary | ICD-10-CM | POA: Diagnosis not present

## 2023-04-10 HISTORY — DX: Left bundle-branch block, unspecified: I44.7

## 2023-04-10 HISTORY — PX: COLONOSCOPY: SHX5424

## 2023-04-10 SURGERY — COLONOSCOPY
Anesthesia: General

## 2023-04-10 MED ORDER — SODIUM CHLORIDE 0.9 % IV SOLN: INTRAVENOUS | Status: DC

## 2023-04-10 MED ORDER — DEXMEDETOMIDINE HCL IN NACL 80 MCG/20ML IV SOLN
INTRAVENOUS | Status: AC
  Filled 2023-04-10: qty 20

## 2023-04-10 MED ORDER — PROPOFOL 1000 MG/100ML IV EMUL
INTRAVENOUS | Status: AC
  Filled 2023-04-10: qty 100

## 2023-04-10 MED ORDER — PROPOFOL 500 MG/50ML IV EMUL
INTRAVENOUS | Status: DC | PRN
  Administered 2023-04-10: 50 mg via INTRAVENOUS
  Administered 2023-04-10: 150 ug/kg/min via INTRAVENOUS

## 2023-04-10 MED ORDER — LIDOCAINE HCL (PF) 2 % IJ SOLN
INTRAMUSCULAR | Status: AC
  Filled 2023-04-10: qty 5

## 2023-04-10 MED ORDER — LIDOCAINE HCL (CARDIAC) PF 100 MG/5ML IV SOSY
PREFILLED_SYRINGE | INTRAVENOUS | Status: DC | PRN
Start: 2023-04-10 — End: 2023-04-10
  Administered 2023-04-10: 30 mg via INTRAVENOUS

## 2023-04-10 NOTE — Anesthesia Preprocedure Evaluation (Signed)
 Anesthesia Evaluation  Patient identified by MRN, date of birth, ID band Patient awake    Reviewed: Allergy & Precautions, NPO status , Patient's Chart, lab work & pertinent test results  History of Anesthesia Complications Negative for: history of anesthetic complications  Airway Mallampati: III  TM Distance: <3 FB Neck ROM: full    Dental  (+) Chipped, Poor Dentition, Missing   Pulmonary neg shortness of breath, sleep apnea , former smoker   Pulmonary exam normal        Cardiovascular Exercise Tolerance: Good hypertension, (-) angina + CAD and +CHF  Normal cardiovascular exam+ dysrhythmias + Cardiac Defibrillator      Neuro/Psych negative neurological ROS  negative psych ROS   GI/Hepatic negative GI ROS, Neg liver ROS,,,  Endo/Other  negative endocrine ROS    Renal/GU negative Renal ROS  negative genitourinary   Musculoskeletal   Abdominal   Peds  Hematology negative hematology ROS (+)   Anesthesia Other Findings Past Medical History: No date: Coronary artery disease     Comment:  a. 10/2011 Cath: LM 85, LAD 20p, D1 20, LCX 20/72m, OM1               85, RCA 60ost, 22m, 50d; b. 10/2011 CABG x 3: LIMA->LAD,               RIMA->dRCA, L Radial->OM2; c. 08/2015 MV: EF 33%, apical               ant, apical septal, apical infarct, no ischemia. 10/23/2011: Family history of coronary artery disease No date: HFrEF (heart failure with reduced ejection fraction) (HCC)     Comment:  a. 02/2017 cMRI: EF 43%, mod MR, minimal LGE, nl RV               size/fxn; b. 04/2019 Echo: EF 25-30%, glob HK, gr2 DD, mod              reduced RV fxn, triv MR/MR, Ao root 39mm. 1994: Hodgkin's lymphoma (HCC)     Comment:  neck/midchest area with radiation 10/23/2011: Hyperlipidemia No date: Hypertension No date: Ischemic cardiomyopathy     Comment:  a. 08/2016 Echo: EF 20-25%; b. 02/2017 cMRI: EF 43% (no               indication for ICD  based on this finding); c. 04/2019               Echo: EF 25-30%. No date: LBBB (left bundle branch block) 10/22/2011: Left main coronary artery disease 10/24/2011: S/P CABG x 3     Comment:  LIMA to LAD, RIMA to RCA, LRA to OM2 10/21/1992: S/P radiation therapy     Comment:  Radiation therapy to chest and neck for Hodgkin's               lymphoma 10/22/2011: Unstable angina [facility])  Past Surgical History: 07/08/2021: BIV ICD INSERTION CRT-D; N/A     Comment:  Procedure: BIV ICD INSERTION CRT-D;  Surgeon: Duke Salvia, MD;  Location: [facility] INVASIVE CV LAB;  Service:               Cardiovascular;  Laterality: N/A; No date: CARDIAC CATHETERIZATION No date: CARPAL TUNNEL RELEASE 10/24/2011: CORONARY ARTERY BYPASS GRAFT     Comment:  Procedure: CORONARY ARTERY BYPASS GRAFTING (CABG);  Surgeon: Purcell Nails, MD;  Location: [facility] OR;  Service:              Open Heart Surgery;  Laterality: N/A;  Coronary Artery               Bypass Grafting X 3 Using Left Internal  Mammary Artery,               Right Internal Mammary Artery and Right Radial Artery 02/15/2021: CORONARY STENT INTERVENTION; N/A     Comment:  Procedure: CORONARY STENT INTERVENTION;  Surgeon: Yvonne Kendall, MD;  Location: [facility] INVASIVE CV LAB;                Service: Cardiovascular;  Laterality: N/A; 1994: LYMPH NODE BIOPSY     Comment:  left neck 10/24/2011: RADIAL ARTERY HARVEST     Comment:  Procedure: RADIAL ARTERY HARVEST;  Surgeon: Purcell Nails, MD;  Location: [facility] OR;  Service: Vascular;                Laterality: Left; 02/15/2021: RIGHT/LEFT HEART CATH AND CORONARY/GRAFT ANGIOGRAPHY; N/A     Comment:  Procedure: RIGHT/LEFT HEART CATH AND CORONARY/GRAFT               ANGIOGRAPHY;  Surgeon: Yvonne Kendall, MD;  Location:               [facility] INVASIVE CV LAB;  Service: Cardiovascular;                Laterality: N/A;  BMI    Body Mass Index: 33.21 kg/m       Reproductive/Obstetrics negative OB ROS                             Anesthesia Physical Anesthesia Plan  ASA: 3  Anesthesia Plan: General   Post-op Pain Management:    Induction: Intravenous  PONV Risk Score and Plan: Propofol infusion and TIVA  Airway Management Planned: Natural Airway and Nasal Cannula  Additional Equipment:   Intra-op Plan:   Post-operative Plan:   Informed Consent: I have reviewed the patients History and Physical, chart, labs and discussed the procedure including the risks, benefits and alternatives for the proposed anesthesia with the patient or authorized representative who has indicated his/her understanding and acceptance.     Dental Advisory Given  Plan Discussed with: Anesthesiologist, CRNA and Surgeon  Anesthesia Plan Comments: (Patient consented for risks of anesthesia including but not limited to:  - adverse reactions to medications - risk of airway placement if required - damage to eyes, teeth, lips or other oral mucosa - nerve damage due to positioning  - sore throat or hoarseness - Damage to heart, brain, nerves, lungs, other parts of body or loss of life  Patient voiced understanding and assent.)       Anesthesia Quick Evaluation

## 2023-04-10 NOTE — H&P (Signed)
 Pre-Procedure H&P   Patient ID: Willie [name] is a 58 y.o. male.  Gastroenterology Provider: Jaynie Collins, DO  Referring Provider: Vevelyn Pat, NP PCP: Barbette Reichmann, MD  Date: 04/10/2023  HPI Mr. Willie [name] is a 58 y.o. male who presents today for Colonoscopy for Positive Cologuard .  Patient had a positive Cologuard in July 2024.  Reports his bowels are regular without melena or hematochezia.  His last colonoscopy was negative in 1994  Patient on Plavix- has been held for the procedure 5 days Status post AICD/ppm placement  Hemoglobin 15.7 MCV 87 platelets 212,000 creatinine 1.1   Past Medical History:  Diagnosis Date   Coronary artery disease    a. 10/2011 Cath: LM 85, LAD 20p, D1 20, LCX 20/48m, OM1 85, RCA 60ost, 34m, 50d; b. 10/2011 CABG x 3: LIMA->LAD, RIMA->dRCA, L Radial->OM2; c. 08/2015 MV: EF 33%, apical ant, apical septal, apical infarct, no ischemia.   Family history of coronary artery disease 10/23/2011   HFrEF (heart failure with reduced ejection fraction) (HCC)    a. 02/2017 cMRI: EF 43%, mod MR, minimal LGE, nl RV size/fxn; b. 04/2019 Echo: EF 25-30%, glob HK, gr2 DD, mod reduced RV fxn, triv MR/MR, Ao root 39mm.   Hodgkin's lymphoma (HCC) 1994   neck/midchest area with radiation   Hyperlipidemia 10/23/2011   Hypertension    Ischemic cardiomyopathy    a. 08/2016 Echo: EF 20-25%; b. 02/2017 cMRI: EF 43% (no indication for ICD based on this finding); c. 04/2019 Echo: EF 25-30%.   LBBB (left bundle branch block)    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   LIMA to LAD, RIMA to RCA, LRA to OM2   S/P radiation therapy 10/21/1992   Radiation therapy to chest and neck for Hodgkin's lymphoma   Unstable angina (HCC) 10/22/2011    Past Surgical History:  Procedure Laterality Date   BIV ICD INSERTION CRT-D N/A 07/08/2021   Procedure: BIV ICD INSERTION CRT-D;  Surgeon: Duke Salvia, MD;  Location: [facility] INVASIVE CV  LAB;  Service: Cardiovascular;  Laterality: N/A;   CARDIAC CATHETERIZATION     CARPAL TUNNEL RELEASE     CORONARY ARTERY BYPASS GRAFT  10/24/2011   Procedure: CORONARY ARTERY BYPASS GRAFTING (CABG);  Surgeon: Purcell Nails, MD;  Location: [facility] OR;  Service: Open Heart Surgery;  Laterality: N/A;  Coronary Artery Bypass Grafting X 3 Using Left Internal  Mammary Artery, Right Internal Mammary Artery and Right Radial Artery   CORONARY STENT INTERVENTION N/A 02/15/2021   Procedure: CORONARY STENT INTERVENTION;  Surgeon: Yvonne Kendall, MD;  Location: [facility] INVASIVE CV LAB;  Service: Cardiovascular;  Laterality: N/A;   LYMPH NODE BIOPSY  1994   left neck   RADIAL ARTERY HARVEST  10/24/2011   Procedure: RADIAL ARTERY HARVEST;  Surgeon: Purcell Nails, MD;  Location: [facility] OR;  Service: Vascular;  Laterality: Left;   RIGHT/LEFT HEART CATH AND CORONARY/GRAFT ANGIOGRAPHY N/A 02/15/2021   Procedure: RIGHT/LEFT HEART CATH AND CORONARY/GRAFT ANGIOGRAPHY;  Surgeon: Yvonne Kendall, MD;  Location: [facility] INVASIVE CV LAB;  Service: Cardiovascular;  Laterality: N/A;    Family History No h/o GI disease or malignancy  Review of Systems  Constitutional:  Negative for activity change, appetite change, chills, diaphoresis, fatigue, fever and unexpected weight change.  HENT:  Negative for trouble swallowing and voice change.   Respiratory:  Negative for shortness of breath and wheezing.   Cardiovascular:  Negative for chest pain,  palpitations and leg swelling.  Gastrointestinal:  Negative for abdominal distention, abdominal pain, anal bleeding, blood in stool, constipation, diarrhea, nausea and vomiting.  Musculoskeletal:  Negative for arthralgias and myalgias.  Skin:  Negative for color change and pallor.  Neurological:  Negative for dizziness, syncope and weakness.  Psychiatric/Behavioral:  Negative for confusion. The patient is not nervous/anxious.   All other systems reviewed and are negative.     Medications No current facility-administered medications on file prior to encounter.   Current Outpatient Medications on File Prior to Encounter  Medication Sig Dispense Refill   bisoprolol (ZEBETA) 5 MG tablet Take 1 tablet (5 mg total) by mouth daily. 90 tablet 3   clopidogrel (PLAVIX) 75 MG tablet TAKE ONE TABLET BY MOUTH ONE TIME DAILY WITH BREAKFAST 90 tablet 3   colchicine 0.6 MG tablet Take 0.6 mg by mouth daily as needed (GOUT FLARES).     dapagliflozin propanediol (FARXIGA) 10 MG TABS tablet Take 1 tablet (10 mg total) by mouth daily with breakfast. 90 tablet 3   eplerenone (INSPRA) 25 MG tablet Take 2 tablets (50 mg total) by mouth at bedtime. 60 tablet 11   Evolocumab (REPATHA SURECLICK) 140 MG/ML SOAJ INJECT 140MG  INTO THE SKIN EVERY 14 DAYS 2 mL 11   furosemide (LASIX) 40 MG tablet Take 0.5 tablets (20 mg total) by mouth as needed. For weight gain of 3lb in 24 hours or 5 lb in a week 30 tablet 3   pantoprazole (PROTONIX) 40 MG tablet Take 40 mg by mouth in the morning.     rosuvastatin (CRESTOR) 40 MG tablet Take 1 tablet (40 mg total) by mouth daily. No further refills until seen in office. Thank you 15 tablet 0   sacubitril-valsartan (ENTRESTO) 49-51 MG Take 1 tablet by mouth 2 (two) times daily. 60 tablet 11   senna (SENOKOT) 8.6 MG tablet Take 4 tablets by mouth daily with supper.     sodium polystyrene (KAYEXALATE) 15 GM/60ML suspension Take 15 g by mouth as needed.     traZODone (DESYREL) 50 MG tablet Take 50 mg by mouth at bedtime.     vitamin B-12 (CYANOCOBALAMIN) 1000 MCG tablet Take 1 tablet (1,000 mcg total) by mouth daily. 30 tablet 1   aspirin EC 81 MG tablet Take 81 mg by mouth daily. Swallow whole. (Patient not taking: Reported on 04/10/2023)     icosapent Ethyl (VASCEPA) 1 g capsule TAKE 2 CAPSULES BY MOUTH TWICE A DAY. 120 capsule 11    Pertinent medications related to GI and procedure were reviewed by me with the patient prior to the procedure   Current  Facility-Administered Medications:    0.9 %  sodium chloride infusion, , Intravenous, Continuous, Jaynie Collins, DO, Last Rate: 20 mL/hr at 04/10/23 1211, New Bag at 04/10/23 1211  sodium chloride 20 mL/hr at 04/10/23 1211       Allergies  Allergen Reactions   Spironolactone Other (See Comments)    Caused Chest swelling/knots around breast area.     Allergies were reviewed by me prior to the procedure  Objective   Body mass index is 33.21 kg/m. Vitals:   04/10/23 1205  BP: (!) 163/90  Pulse: 66  Resp: 12  Temp: (!) 97.1 F (36.2 C)  TempSrc: Temporal  SpO2: 100%  Weight: 99.1 kg  Height: 5\' 8"  (1.727 m)     Physical Exam Vitals and nursing note reviewed.  Constitutional:      General: He is not in acute distress.  Appearance: Normal appearance. He is not ill-appearing, toxic-appearing or diaphoretic.  HENT:     Head: Normocephalic and atraumatic.     Nose: Nose normal.     Mouth/Throat:     Mouth: Mucous membranes are moist.     Pharynx: Oropharynx is clear.  Eyes:     General: No scleral icterus.    Extraocular Movements: Extraocular movements intact.  Cardiovascular:     Rate and Rhythm: Regular rhythm. Bradycardia present.     Heart sounds: Normal heart sounds. No murmur heard.    No friction rub. No gallop.  Pulmonary:     Effort: Pulmonary effort is normal. No respiratory distress.     Breath sounds: Normal breath sounds. No wheezing, rhonchi or rales.  Abdominal:     General: Bowel sounds are normal. There is no distension.     Palpations: Abdomen is soft.     Tenderness: There is no abdominal tenderness. There is no guarding or rebound.  Musculoskeletal:     Cervical back: Neck supple.     Right lower leg: No edema.     Left lower leg: No edema.  Skin:    General: Skin is warm and dry.     Coloration: Skin is not jaundiced or pale.  Neurological:     General: No focal deficit present.     Mental Status: He is alert and oriented to  person, place, and time. Mental status is at baseline.  Psychiatric:        Mood and Affect: Mood normal.        Behavior: Behavior normal.        Thought Content: Thought content normal.        Judgment: Judgment normal.      Assessment:  Mr. Willie [name] is a 58 y.o. male  who presents today for Colonoscopy for Positive Cologuard .  Plan:  Colonoscopy with possible intervention today  Colonoscopy with possible biopsy, control of bleeding, polypectomy, and interventions as necessary has been discussed with the patient/patient representative. Informed consent was obtained from the patient/patient representative after explaining the indication, nature, and risks of the procedure including but not limited to death, bleeding, perforation, missed neoplasm/lesions, cardiorespiratory compromise, and reaction to medications. Opportunity for questions was given and appropriate answers were provided. Patient/patient representative has verbalized understanding is amenable to undergoing the procedure.   Jaynie Collins, DO  [facility]  Portions of the record may have been created with voice recognition software. Occasional wrong-word or 'sound-a-like' substitutions may have occurred due to the inherent limitations of voice recognition software.  Read the chart carefully and recognize, using context, where substitutions may have occurred.

## 2023-04-10 NOTE — Transfer of Care (Signed)
 Immediate Anesthesia Transfer of Care Note  Patient: Willie [name]  Procedure(s) Performed: COLONOSCOPY  Patient Location: PACU  Anesthesia Type:General  Level of Consciousness: drowsy  Airway & Oxygen Therapy: Patient Spontanous Breathing and Patient connected to nasal cannula oxygen  Post-op Assessment: Report given to RN and Post -op Vital signs reviewed and stable  Post vital signs: stable  Last Vitals:  Vitals Value Taken Time  BP 162/131 04/10/23 1338  Temp    Pulse    Resp 16 04/10/23 1339  SpO2    Vitals shown include unfiled device data.  Last Pain:  Vitals:   04/10/23 1205  TempSrc: Temporal  PainSc: 0-No pain         Complications: No notable events documented.

## 2023-04-10 NOTE — Op Note (Signed)
 [facility] Patient Name: Willie [name] Procedure Date: 04/10/2023 1:00 PM MRN: [id number] Account #: [id number] Date of Birth: May 18, 1965 Admit Type: Outpatient Age: 58 Room: [facility] ENDO ROOM 1 Gender: Male Note Status: Finalized Instrument Name: Colonscope [id number] Procedure:             Colonoscopy Indications:           Positive Cologuard test Providers:             Jaynie Collins DO, DO Referring MD:          Barbette Reichmann, MD (Referring MD) Medicines:             Monitored Anesthesia Care Complications:         No immediate complications. Estimated blood loss:                         Minimal. Procedure:             Pre-Anesthesia Assessment:                        - Prior to the procedure, a History and Physical was                         performed, and patient medications and allergies were                         reviewed. The patient is competent. The risks and                         benefits of the procedure and the sedation options and                         risks were discussed with the patient. All questions                         were answered and informed consent was obtained.                         Patient identification and proposed procedure were                         verified by the physician, the nurse, the anesthetist                         and the technician in the endoscopy suite. Mental                         Status Examination: alert and oriented. Airway                         Examination: normal oropharyngeal airway and neck                         mobility. Respiratory Examination: clear to                         auscultation. CV Examination: bradycardia noted.  Prophylactic Antibiotics: The patient does not require                         prophylactic antibiotics. Prior Anticoagulants: The                         patient has taken Plavix (clopidogrel), last dose was                          5 days prior to procedure. ASA Grade Assessment: III -                         A patient with severe systemic disease. After                         reviewing the risks and benefits, the patient was                         deemed in satisfactory condition to undergo the                         procedure. The anesthesia plan was to use monitored                         anesthesia care (MAC). Immediately prior to                         administration of medications, the patient was                         re-assessed for adequacy to receive sedatives. The                         heart rate, respiratory rate, oxygen saturations,                         blood pressure, adequacy of pulmonary ventilation, and                         response to care were monitored throughout the                         procedure. The physical status of the patient was                         re-assessed after the procedure.                        After obtaining informed consent, the colonoscope was                         passed under direct vision. Throughout the procedure,                         the patient's blood pressure, pulse, and oxygen                         saturations were monitored continuously. The  Colonoscope was introduced through the anus and                         advanced to the the terminal ileum, with                         identification of the appendiceal orifice and IC                         valve. The colonoscopy was performed without                         difficulty. The patient tolerated the procedure well.                         The quality of the bowel preparation was evaluated                         using the BBPS [facility] Bowel Preparation Scale) with                         scores of: Right Colon = 3 (entire mucosa seen well                         with no residual staining, small fragments of stool or                         opaque liquid),  Transverse Colon = 3 (entire mucosa                         seen well with no residual staining, small fragments                         of stool or opaque liquid) and Left Colon = 2 (minor                         amount of residual staining, small fragments of stool                         and/or opaque liquid, but mucosa seen well). The total                         BBPS score equals 8. The quality of the bowel                         preparation was excellent. The terminal ileum,                         ileocecal valve, appendiceal orifice, and rectum were                         photographed. Findings:      The perianal and digital rectal examinations were normal. Pertinent       negatives include normal sphincter tone.      The terminal ileum appeared normal. Estimated blood loss: none.      Four sessile polyps were found in the rectum (1), descending  colon (1)       and ascending colon (2). The polyps were 1 to 2 mm in size. These polyps       were removed with a jumbo cold forceps. Resection and retrieval were       complete. Estimated blood loss was minimal.      Two sessile polyps were found in the descending colon. The polyps were 3       to 5 mm in size. These polyps were removed with a cold snare. Resection       and retrieval were complete. Estimated blood loss was minimal.      The exam was otherwise without abnormality on direct and retroflexion       views. Impression:            - The examined portion of the ileum was normal.                        - Four 1 to 2 mm polyps in the rectum, in the                         descending colon and in the ascending colon, removed                         with a jumbo cold forceps. Resected and retrieved.                        - Two 3 to 5 mm polyps in the descending colon,                         removed with a cold snare. Resected and retrieved.                        - The examination was otherwise normal on direct and                          retroflexion views. Recommendation:        - Patient has a contact number available for                         emergencies. The signs and symptoms of potential                         delayed complications were discussed with the patient.                         Return to normal activities tomorrow. Written                         discharge instructions were provided to the patient.                        - Discharge patient to home.                        - Resume previous diet.                        - Continue present medications.                        -  No ibuprofen, naproxen, or other non-steroidal                         anti-inflammatory drugs for 5 days after polyp removal.                        - Resume Plavix (clopidogrel) at prior dose in 2 days.                         Refer to managing physician for further adjustment of                         therapy.                        - Await pathology results.                        - Repeat colonoscopy for surveillance based on                         pathology results.                        - Return to referring physician as previously                         scheduled.                        - The findings and recommendations were discussed with                         the patient. Procedure Code(s):     --- Professional ---                        [phone], Colonoscopy, flexible; with removal of                         tumor(s), polyp(s), or other lesion(s) by snare                         technique                        [id number], 59, Colonoscopy, flexible; with biopsy, single                         or multiple Diagnosis Code(s):     --- Professional ---                        D12.8, Benign neoplasm of rectum                        D12.2, Benign neoplasm of ascending colon                        D12.4, Benign neoplasm of descending colon                        R19.5, Other fecal abnormalities CPT copyright 2022  American  Medical Association. All rights reserved. The codes documented in this report are preliminary and upon coder review may  be revised to meet current compliance requirements. Attending Participation:      I personally performed the entire procedure. Elfredia Nevins, DO Jaynie Collins DO, DO 04/10/2023 1:39:28 PM This report has been signed electronically. Number of Addenda: 0 Note Initiated On: 04/10/2023 1:00 PM Scope Withdrawal Time: 0 hours 20 minutes 26 seconds  Total Procedure Duration: 0 hours 24 minutes 19 seconds  Estimated Blood Loss:  Estimated blood loss was minimal.      [facility]

## 2023-04-10 NOTE — Interval H&P Note (Signed)
 History and Physical Interval Note: Preprocedure H&P from 04/10/23  was reviewed and there was no interval change after seeing and examining the patient.  Written consent was obtained from the patient after discussion of risks, benefits, and alternatives. Patient has consented to proceed with Colonoscopy with possible intervention   04/10/2023 12:28 PM  Kathyrn Sheriff  has presented today for surgery, with the diagnosis of (+) cOLOGUARD.  The various methods of treatment have been discussed with the patient and family. After consideration of risks, benefits and other options for treatment, the patient has consented to  Procedure(s): COLONOSCOPY (N/A) as a surgical intervention.  The patient's history has been reviewed, patient examined, no change in status, stable for surgery.  I have reviewed the patient's chart and labs.  Questions were answered to the patient's satisfaction.     Jaynie Collins

## 2023-04-12 NOTE — Anesthesia Postprocedure Evaluation (Signed)
 Anesthesia Post Note  Patient: ADVAY VOLANTE  Procedure(s) Performed: COLONOSCOPY  Patient location during evaluation: Endoscopy Anesthesia Type: General Level of consciousness: awake and alert Pain management: pain level controlled Vital Signs Assessment: post-procedure vital signs reviewed and stable Respiratory status: spontaneous breathing, nonlabored ventilation, respiratory function stable and patient connected to nasal cannula oxygen Cardiovascular status: blood pressure returned to baseline and stable Postop Assessment: no apparent nausea or vomiting Anesthetic complications: no   No notable events documented.   Last Vitals:  Vitals:   04/10/23 1337 04/10/23 1355  BP: 110/72 (!) 144/86  Pulse: 71   Resp: 16   Temp: 37 C   SpO2: 95%     Last Pain:  Vitals:   04/10/23 1355  TempSrc:   PainSc: 0-No pain                 Cleda Mccreedy Bayleigh Loflin

## 2023-04-13 ENCOUNTER — Encounter: Payer: Self-pay | Admitting: Gastroenterology

## 2023-04-13 ENCOUNTER — Ambulatory Visit (INDEPENDENT_AMBULATORY_CARE_PROVIDER_SITE_OTHER): Payer: Managed Care, Other (non HMO)

## 2023-04-13 DIAGNOSIS — I5022 Chronic systolic (congestive) heart failure: Secondary | ICD-10-CM

## 2023-04-13 DIAGNOSIS — I255 Ischemic cardiomyopathy: Secondary | ICD-10-CM

## 2023-04-13 LAB — SURGICAL PATHOLOGY

## 2023-04-14 LAB — CUP PACEART REMOTE DEVICE CHECK
Battery Remaining Longevity: 71 mo
Battery Remaining Percentage: 74 %
Battery Voltage: 3.01 V
Brady Statistic AP VP Percent: 23 %
Brady Statistic AP VS Percent: 1 %
Brady Statistic AS VP Percent: 77 %
Brady Statistic AS VS Percent: 1 %
Brady Statistic RA Percent Paced: 23 %
Date Time Interrogation Session: 20250407062725
HighPow Impedance: 70 Ohm
HighPow Impedance: 70 Ohm
Implantable Lead Connection Status: 753985
Implantable Lead Connection Status: 753985
Implantable Lead Connection Status: 753985
Implantable Lead Implant Date: 20230703
Implantable Lead Implant Date: 20230703
Implantable Lead Implant Date: 20230703
Implantable Lead Location: 753858
Implantable Lead Location: 753859
Implantable Lead Location: 753860
Implantable Lead Model: 3830
Implantable Lead Model: 7122
Implantable Pulse Generator Implant Date: 20230703
Lead Channel Impedance Value: 480 Ohm
Lead Channel Impedance Value: 590 Ohm
Lead Channel Impedance Value: 610 Ohm
Lead Channel Pacing Threshold Amplitude: 0.5 V
Lead Channel Pacing Threshold Amplitude: 0.75 V
Lead Channel Pacing Threshold Amplitude: 0.75 V
Lead Channel Pacing Threshold Pulse Width: 0.05 ms
Lead Channel Pacing Threshold Pulse Width: 0.5 ms
Lead Channel Pacing Threshold Pulse Width: 0.5 ms
Lead Channel Sensing Intrinsic Amplitude: 11.9 mV
Lead Channel Sensing Intrinsic Amplitude: 2.7 mV
Lead Channel Setting Pacing Amplitude: 0.25 V
Lead Channel Setting Pacing Amplitude: 2 V
Lead Channel Setting Pacing Amplitude: 2 V
Lead Channel Setting Pacing Pulse Width: 0.05 ms
Lead Channel Setting Pacing Pulse Width: 0.5 ms
Lead Channel Setting Sensing Sensitivity: 0.5 mV
Pulse Gen Serial Number: 8906747

## 2023-04-23 DIAGNOSIS — Z006 Encounter for examination for normal comparison and control in clinical research program: Secondary | ICD-10-CM

## 2023-04-23 NOTE — Research (Signed)
 DETECT 18 MONTH FOLLOW UP IN CLINIC    Called patient for Detect 18 month follow up. Did not answer, LVM to call research office back.   VISIT DETAILS  Date of Visit: [] [] -[] [] [] -[] [] [] []   (dd/mmm/yyyy)   NYHA  NYHA Class: [] 1 [] 2 [] 3 [] 4     [employer] RECORDING EQUIPMENT  Was there a change in equipment supplied by the Spons[employer] since last visit?     []   Yes      []   No  New Phone/Tablet serial number: ____________________________________________   New Phone/Tablet Model: __________________________________________________    SMOKING HISTORY  Has the subject smoked tobacco since the last visit? []  Yes  []   No  Does the subject currently smoke tobacco?  []  Yes []   No  If yes, how many packs per week? __________________   CONCOMITANT MEDICATIONS  Have there been any changes to the subject's medications since last visit?  If yes, please update Concomitant Medications Log.  []  Yes []   No   ADVERSE EVENTS  Have any new adverse events (AEs) occurred since last visit?  If yes, please update Adverse Events Log. []  Yes  []  No

## 2023-04-25 ENCOUNTER — Encounter: Payer: Self-pay | Admitting: Internal Medicine

## 2023-04-29 ENCOUNTER — Other Ambulatory Visit (HOSPITAL_COMMUNITY): Payer: Self-pay | Admitting: Cardiology

## 2023-06-03 NOTE — Addendum Note (Signed)
 Addended by: Edra Govern D on: 06/03/2023 12:33 PM   Modules accepted: Orders

## 2023-06-03 NOTE — Progress Notes (Signed)
 Remote ICD transmission.

## 2023-06-09 ENCOUNTER — Other Ambulatory Visit: Payer: Self-pay | Admitting: *Deleted

## 2023-06-09 MED ORDER — CLOPIDOGREL BISULFATE 75 MG PO TABS: ORAL_TABLET | ORAL | 0 refills | Status: DC

## 2023-06-09 NOTE — Telephone Encounter (Signed)
Pt scheduled on 6/12

## 2023-06-17 NOTE — Progress Notes (Signed)
 Cardiology Office Note    Date:  06/18/2023   ID:  Willie [name], DOB 12/14/1965, MRN [id number]  PCP:  Antonio Baumgarten, MD  Cardiologist:  Belva Boyden, MD  Electrophysiologist:  Richardo Chandler, MD  Advanced Heart Failure: Peder Bourdon, MD  Chief Complaint: Follow-up  History of Present Illness:   Willie [name] is a 58 y.o. male with history of CAD status post three-vessel CABG in 2013, HFrEF secondary to ICM status post SJM ICD with left bundle lead, Hodgkin's lymphoma diagnosed in 1994 treated with chemoradiation, HTN, HLD, OSA intolerant to CPAP, and strong family history of CAD who presents for follow-up of his CAD and cardiomyopathy.   He underwent three-vessel CABG in 2013.  Echo at that time showed an EF of 30%.  Cardiomyopathy has persisted throughout the years.  Cardiac MRI in 2019 showed an EF of 43%.  In 01/2021, he underwent LHC which showed 90% proximal to mid RCA stenosis with an atretic RIMA to PDA.  This was treated with PCI/DES x 2.  RHC showed prominent RV failure.  Echo in 02/2021 showed an EF of 20 to 25% with normal RV.  Echo in 05/2021 showed a persistent cardiomyopathy with an EF of 25 to 30% with septal lateral dyssynchrony and mild RV dysfunction.  He subsequently underwent SJM ICD left bundle lead placement.  Echo in 11/2021 showed an EF of 35% with mild RV dysfunction.  He has been followed by the advanced heart failure service, last seeing them in 11/2021 and was feeling better following device reprogramming by EP.  Most recent echo from 11/2022 showed an EF of 35 to 40%, mild LVH, normal RV systolic function, ventricular cavity size, and RVSP, mildly dilated left atrium, mild to moderate mitral regurgitation, and an estimated right atrial pressure of 3 mmHg.  He was last seen by general cardiology in 05/2022, and has since been followed by the advanced heart failure service, last seeing them in 03/2023, at which time he was doing well from a cardiac perspective.  He  comes in doing very well from a cardiac perspective and is without symptoms of angina or cardiac decompensation.  Remains active at baseline, though is not currently going to the gym as he is helping to care for his wife who recently had a knee replacement surgery.  No dizziness, presyncope, or syncope.  No lower extremity swelling or progressive orthopnea.  No falls or symptoms concerning for bleeding.  His weight is down 6 pounds today when compared to his last visit in our office in 05/2022.  Continues to monitor his sodium intake.  Since having his furosemide  transition to as needed dosing he typically takes his once every 1.5 to 2 weeks for shortness of breath that improves thereafter.  Overall, feels like he is doing well and does not have any acute cardiac concerns at this time.   Labs independently reviewed: 04/2023 - potassium 4.7, BUN 12, serum creatinine 1.3 03/2023 - BNP 59 01/2023 - Hgb 15.7, PLT 212, albumin  4.4, AST/ALT normal, A1c 5.6, TC 89, TG 85, HDL 35, LDL 37, TSH normal  Past Medical History:  Diagnosis Date   Coronary artery disease    a. 10/2011 Cath: LM 85, LAD 20p, D1 20, LCX 20/40m, OM1 85, RCA 60ost, 2m, 50d; b. 10/2011 CABG x 3: LIMA->LAD, RIMA->dRCA, L Radial->OM2; c. 08/2015 MV: EF 33%, apical ant, apical septal, apical infarct, no ischemia.   Family history of coronary artery disease 10/23/2011   HFrEF (heart  failure with reduced ejection fraction) (HCC)    a. 02/2017 cMRI: EF 43%, mod MR, minimal LGE, nl RV size/fxn; b. 04/2019 Echo: EF 25-30%, glob HK, gr2 DD, mod reduced RV fxn, triv MR/MR, Ao root 39mm.   Hodgkin's lymphoma (HCC) 1994   neck/midchest area with radiation   Hyperlipidemia 10/23/2011   Hypertension    Ischemic cardiomyopathy    a. 08/2016 Echo: EF 20-25%; b. 02/2017 cMRI: EF 43% (no indication for ICD based on this finding); c. 04/2019 Echo: EF 25-30%.   LBBB (left bundle branch block)    Left main coronary artery disease 10/22/2011   S/P CABG x 3  10/24/2011   LIMA to LAD, RIMA to RCA, LRA to OM2   S/P radiation therapy 10/21/1992   Radiation therapy to chest and neck for Hodgkin's lymphoma   Unstable angina (HCC) 10/22/2011    Past Surgical History:  Procedure Laterality Date   BIV ICD INSERTION CRT-D N/A 07/08/2021   Procedure: BIV ICD INSERTION CRT-D;  Surgeon: Verona Goodwill, MD;  Location: [facility] INVASIVE CV LAB;  Service: Cardiovascular;  Laterality: N/A;   CARDIAC CATHETERIZATION     CARPAL TUNNEL RELEASE     COLONOSCOPY N/A 04/10/2023   Procedure: COLONOSCOPY;  Surgeon: Quintin Buckle, DO;  Location: [facility] ENDOSCOPY;  Service: Gastroenterology;  Laterality: N/A;   CORONARY ARTERY BYPASS GRAFT  10/24/2011   Procedure: CORONARY ARTERY BYPASS GRAFTING (CABG);  Surgeon: Gardenia Jump, MD;  Location: [facility] OR;  Service: Open Heart Surgery;  Laterality: N/A;  Coronary Artery Bypass Grafting X 3 Using Left Internal  Mammary Artery, Right Internal Mammary Artery and Right Radial Artery   CORONARY STENT INTERVENTION N/A 02/15/2021   Procedure: CORONARY STENT INTERVENTION;  Surgeon: Sammy Crisp, MD;  Location: [facility] INVASIVE CV LAB;  Service: Cardiovascular;  Laterality: N/A;   LYMPH NODE BIOPSY  1994   left neck   RADIAL ARTERY HARVEST  10/24/2011   Procedure: RADIAL ARTERY HARVEST;  Surgeon: Gardenia Jump, MD;  Location: [facility] OR;  Service: Vascular;  Laterality: Left;   RIGHT/LEFT HEART CATH AND CORONARY/GRAFT ANGIOGRAPHY N/A 02/15/2021   Procedure: RIGHT/LEFT HEART CATH AND CORONARY/GRAFT ANGIOGRAPHY;  Surgeon: Sammy Crisp, MD;  Location: [facility] INVASIVE CV LAB;  Service: Cardiovascular;  Laterality: N/A;    Current Medications: Current Meds  Medication Sig   bisoprolol  (ZEBETA ) 5 MG tablet Take 1 tablet (5 mg total) by mouth daily.   clopidogrel  (PLAVIX ) 75 MG tablet TAKE ONE TABLET BY MOUTH ONE TIME DAILY WITH BREAKFAST   colchicine 0.6 MG tablet Take 0.6 mg by mouth daily as needed (GOUT FLARES).   eplerenone  (INSPRA )  25 MG tablet Take 2 tablets (50 mg total) by mouth at bedtime.   Evolocumab  (REPATHA  SURECLICK) 140 MG/ML SOAJ INJECT 140MG  INTO THE SKIN EVERY 14 DAYS   FARXIGA  10 MG TABS tablet TAKE ONE TABLET BY MOUTH ONE TIME DAILY WITH BREAKFAST   furosemide  (LASIX ) 40 MG tablet Take 0.5 tablets (20 mg total) by mouth as needed. For weight gain of 3lb in 24 hours or 5 lb in a week   icosapent  Ethyl (VASCEPA ) 1 g capsule TAKE 2 CAPSULES BY MOUTH TWICE A DAY.   pantoprazole  (PROTONIX ) 40 MG tablet Take 40 mg by mouth in the morning.   rosuvastatin  (CRESTOR ) 40 MG tablet Take 1 tablet (40 mg total) by mouth daily. No further refills until seen in office. Thank you   sacubitril -valsartan  (ENTRESTO ) 49-51 MG Take 1 tablet by mouth 2 (two) times  daily.   senna (SENOKOT) 8.6 MG tablet Take 4 tablets by mouth daily with supper.   sodium polystyrene (KAYEXALATE ) 15 GM/60ML suspension Take 15 g by mouth as needed.   traZODone (DESYREL) 50 MG tablet Take 50 mg by mouth at bedtime.   vitamin B-12 (CYANOCOBALAMIN) 1000 MCG tablet Take 1 tablet (1,000 mcg total) by mouth daily.    Allergies:   Spironolactone    Social History   Socioeconomic History   Marital status: Married    Spouse name: Not on file   Number of children: Not on file   Years of education: Not on file   Highest education level: Not on file  Occupational History   Not on file  Tobacco Use   Smoking status: Former    Current packs/day: 0.00    Types: Cigarettes    Quit date: 10/21/1996    Years since quitting: 26.6   Smokeless tobacco: Former    Types: Snuff    Quit date: 04/05/2019  Vaping Use   Vaping status: Never Used  Substance and Sexual Activity   Alcohol use: No   Drug use: No   Sexual activity: Yes  Other Topics Concern   Not on file  Social History Narrative   Not on file   Social Drivers of Health   Financial Resource Strain: Low Risk  (07/23/2022)   Received from [facility]   Overall Financial  Resource Strain (CARDIA)    Difficulty of Paying Living Expenses: Not hard at all  Food Insecurity: No Food Insecurity (07/23/2022)   Received from [facility]   Hunger Vital Sign    Worried About Running Out of Food in the Last Year: Never true    Ran Out of Food in the Last Year: Never true  Transportation Needs: No Transportation Needs (07/23/2022)   Received from [facility] - [facility]    In the past 12 months, has lack of transportation kept you from medical appointments or from getting medications?: No    Lack of Transportation (Non-Medical): No  Physical Activity: Not on file  Stress: Not on file  Social Connections: Not on file     Family History:  The patient's family history includes Coronary artery disease in an other family member; Heart attack in his father; Heart disease in his mother.  ROS:   12-point review of systems is negative unless otherwise noted in the HPI.   EKGs/Labs/Other Studies Reviewed:    Studies reviewed were summarized above. The additional studies were reviewed today:  2D echo 11/18/2022: 1. Left ventricular ejection fraction, by estimation, is 35 to 40%. The  left ventricle has moderately decreased function. The left ventricle has  no regional wall motion abnormalities. There is mild left ventricular  hypertrophy. Left ventricular  diastolic parameters are indeterminate. The average left ventricular  global longitudinal strain is -14.8 %.   2. Right ventricular systolic function is normal. The right ventricular  size is normal. There is normal pulmonary artery systolic pressure. The  estimated right ventricular systolic pressure is 22.6 mmHg.   3. Left atrial size was mildly dilated.   4. The mitral valve is normal in structure. Mild to moderate mitral valve  regurgitation. No evidence of mitral stenosis.   5. The aortic valve is tricuspid. Aortic valve regurgitation is not  visualized. No  aortic stenosis is present.   6. The inferior vena cava is normal in size with greater than 50%  respiratory  variability, suggesting right atrial pressure of 3 mmHg.  __________  2D echo 11/07/2021: 1. Left ventricular ejection fraction, by estimation, is 35%. The left  ventricle has moderately decreased function. The left ventricle  demonstrates global hypokinesis. Left ventricular diastolic parameters are  consistent with Grade II diastolic  dysfunction (pseudonormalization).   2. Right ventricular systolic function is mildly reduced. The right  ventricular size is normal. There is normal pulmonary artery systolic  pressure. The estimated right ventricular systolic pressure is 12.1 mmHg.   3. The mitral valve is normal in structure. Trivial mitral valve  regurgitation. No evidence of mitral stenosis.   4. The aortic valve is tricuspid. There is mild calcification of the  aortic valve. Aortic valve regurgitation is not visualized. No aortic  stenosis is present.   5. Aortic dilatation noted. There is mild dilatation of the aortic root,  measuring 37 mm.   6. The inferior vena cava is normal in size with greater than 50%  respiratory variability, suggesting right atrial pressure of 3 mmHg.  __________   2D echo 05/29/2021: 1. Left ventricular ejection fraction, by estimation, is 25 to 30%. The  left ventricle has severely decreased function. The left ventricle  demonstrates global hypokinesis with septal-lateral dyssynchrony. The left  ventricular internal cavity size was  mildly dilated. Left ventricular diastolic parameters are consistent with  Grade II diastolic dysfunction (pseudonormalization).   2. Right ventricular systolic function is mildly reduced. The right  ventricular size is normal. Tricuspid regurgitation signal is inadequate  for assessing PA pressure.   3. The mitral valve is normal in structure. Mild mitral valve  regurgitation. No evidence of mitral stenosis.    4. The aortic valve is tricuspid. Aortic valve regurgitation is trivial.   5. Aortic dilatation noted. There is mild dilatation of the aortic root,  measuring 38 mm.   6. The inferior vena cava is normal in size with greater than 50%  respiratory variability, suggesting right atrial pressure of 3 mmHg.  __________   [facility] 02/15/2021: Conclusions: Severe native coronary artery disease, including 90% mid/distal LMCA stenosis, 50% ostial RCA stenosis, and a long segment of proximal through mid RCA disease of up to 90%. Widely patent LIMA-LAD and radial-OM2 bypass graft. Atretic/chronically occluded RIMA-RPDA graft. Mildly elevated left heart filling pressures. Severely elevated right heart filling pressures.  Equalization of end-diastolic pressures raises possibility of restrictive/constrictive physiology. Low normal to mildly reduced cardiac output. Successful PCI to proximal-mid RCA using overlapping Onyx frontier 3.5 x 15 mm (proximal) and 3.0 x 38 mm (distal) drug-eluting stents with 0% residual stenosis and TIMI-3 flow.   Recommendations: Overnight observation. Dual antiplatelet therapy with aspirin  and clopidogrel  for at least 6 months. Consider escalation of diuresis as soon as tomorrow if renal function is stable. Optimize goal-directed medical therapy for chronic HFrEF, as tolerated. Aggressive secondary prevention of coronary artery disease. ___________   2D echo 02/08/2021: 1. Left ventricular ejection fraction, by estimation, is 20 to 25%. The  left ventricle has severely decreased function. The left ventricle  demonstrates global hypokinesis. The left ventricular internal cavity size  was mildly dilated. Left ventricular  diastolic parameters are consistent with Grade II diastolic dysfunction  (pseudonormalization). The average left ventricular global longitudinal  strain is -9.2 %. The global longitudinal strain is abnormal.   2. Right ventricular systolic function is normal.  The right ventricular  size is normal. Tricuspid regurgitation signal is inadequate for assessing  PA pressure.   3. Left atrial size was moderately  dilated.   4. The mitral valve is normal in structure. Mild to moderate mitral valve  regurgitation. No evidence of mitral stenosis.   5. The aortic valve is tricuspid. Aortic valve regurgitation is not  visualized. No aortic stenosis is present.   6. There is borderline dilatation of the aortic root, measuring 39 mm.   7. The inferior vena cava is normal in size with greater than 50%  respiratory variability, suggesting right atrial pressure of 3 mmHg.   Comparison(s): LVEF 25-30%. ___________   2D echo 04/07/2019:  1. Left ventricular ejection fraction, by estimation, is 25 to 30%. The  left ventricle has severely decreased function. The left ventricle  demonstrates global hypokinesis. Left ventricular diastolic parameters are  consistent with Grade II diastolic  dysfunction (pseudonormalization).   2. Right ventricular systolic function is moderately reduced. The right  ventricular size is normal. There is normal pulmonary artery systolic  pressure.   3. The mitral valve is normal in structure. Trivial mitral valve  regurgitation.   4. The aortic valve is grossly normal. Aortic valve regurgitation is not  visualized.   5. Aortic dilatation noted. There is mild dilatation of the aortic root  measuring 39 mm.   6. The inferior vena cava is normal in size with greater than 50%  respiratory variability, suggesting right atrial pressure of 3 mmHg.   Comparison(s): EF 20-25%. ___________   2D echo 08/06/2016: - Left ventricle: The cavity size was normal. There was mild    concentric hypertrophy. Systolic function was severely reduced.    The estimated ejection fraction was in the range of 20% to 25%.    Diffuse hypokinesis. Hypokinesis of the anterior myocardium.    Hypokinesis of the anteroseptal myocardium. Hypokinesis of the     apical myocardium. Doppler parameters are consistent with    abnormal left ventricular relaxation (grade 1 diastolic    dysfunction).  - Aortic root: The aortic root was mildly dilated, 3.6 cm  - Mitral valve: There was mild regurgitation.  - Left atrium: The atrium was mildly dilated.  - Right ventricle: The cavity size was mildly dilated. Wall    thickness was normal. Systolic function was moderately reduced.  - Pulmonary arteries: Systolic pressure could not be accurately    estimated. __________   [facility] MPI 09/05/2015: Exercise myocardial perfusion imaging study with no significant  ischemia Fixed region of mildly decreased perfusion of mild to moderate intensity in the mid to apical anterior wall, apical septal region and apical region Anteroseptal and septal wall hypokinesis, EF estimated at 33%  Abnormal wall motion possibly secondary to left bundle branch block Equivocal EKG changes concerning for ischemia at peak stress or in recovery. Unable to interpret EKG in the setting of left bundle Rare PVCs noted in recovery Target heart rate achieved, 7 METS, exercised for 5 minutes Low risk scan __________   [facility] 10/21/2011: Ostial left main 85% stenosis, proximal LAD 20% stenosis, D1 20% stenosis, proximal LCx 20% stenosis, mid LCx 50% stenosis, OM1 85% stenosis, ostial RCA 60% stenosis, proximal RCA 20% stenosis, mid RCA 20% stenosis, distal RCA 50% stenosis.  Recommendation consult for CABG.   EKG:  EKG is ordered today.  The EKG ordered today demonstrates A-sensed, V-paced, prolonged AV conduction, 62 bpm  Recent Labs: 12/18/2022: ALT 23 03/10/2023: BNP 59.9 04/07/2023: BUN 12; Creatinine, Ser 1.30; Potassium 4.7; Sodium 138  Recent Lipid Panel    Component Value Date/Time   CHOL 189 02/27/2021 1516   TRIG 291 (  H) 02/27/2021 1516   HDL 32 (L) 02/27/2021 1516   CHOLHDL 5.9 (H) 02/27/2021 1516   CHOLHDL 9.4 10/23/2011 0550   VLDL 53 (H) 10/23/2011 0550   LDLCALC 107 (H)  02/27/2021 1516   LDLDIRECT 119 (H) 02/27/2021 1516    PHYSICAL EXAM:    VS:  BP 110/62 (BP Location: Left Arm, Patient Position: Sitting, Cuff Size: Normal)   Pulse 62   Ht 5' 8 (1.727 m)   Wt 213 lb 12.8 oz (97 kg)   SpO2 98%   BMI 32.51 kg/m   BMI: Body mass index is 32.51 kg/m.  Physical Exam Vitals reviewed.  Constitutional:      Appearance: He is well-developed.  HENT:     Head: Normocephalic and atraumatic.   Eyes:     General:        Right eye: No discharge.        Left eye: No discharge.   Neck:     Vascular: No JVD.   Cardiovascular:     Rate and Rhythm: Normal rate and regular rhythm.     Heart sounds: Normal heart sounds, S1 normal and S2 normal. Heart sounds not distant. No midsystolic click and no opening snap. No murmur heard.    No friction rub.  Pulmonary:     Effort: Pulmonary effort is normal. No respiratory distress.     Breath sounds: Normal breath sounds. No decreased breath sounds, wheezing, rhonchi or rales.  Chest:     Chest wall: No tenderness.  Abdominal:     General: There is no distension.     Palpations: Abdomen is soft.     Tenderness: There is no abdominal tenderness.   Musculoskeletal:     Cervical back: Normal range of motion.     Right lower leg: No edema.     Left lower leg: No edema.   Skin:    General: Skin is warm and dry.     Nails: There is no clubbing.   Neurological:     Mental Status: He is alert and oriented to person, place, and time.   Psychiatric:        Speech: Speech normal.        Behavior: Behavior normal.        Thought Content: Thought content normal.        Judgment: Judgment normal.     Wt Readings from Last 3 Encounters:  06/18/23 213 lb 12.8 oz (97 kg)  04/10/23 218 lb 6.4 oz (99.1 kg)  03/10/23 223 lb (101.2 kg)     ASSESSMENT & PLAN:   CAD status post CABG in 2013 status post PCI/DES to the native RCA in 2023 with atretic RIMA to PDA: He continues to do well and is without symptoms of  angina or cardiac decompensation.  Continue aggressive risk factor modification and secondary prevention including clopidogrel  75 mg daily, Repatha  140 mg injected every 14 days, rosuvastatin  40 mg, and Vascepa  2 g twice daily.  No indication for further ischemic testing at this time.  HFrEF secondary to ICM status post ICD: He appears euvolemic and well compensated.  Continue current GDMT including bisoprolol  5 mg daily, Farxiga  10 mg daily, Entresto  49/51 mg twice daily, eplerenone  50 mg daily with Kayexalate  15 mg daily (Lokelma  cost prohibitive), and Lasix  20 mg daily as needed.  Relative hypotension with some positional dizziness precludes escalation of GDMT at this time.  Follow up with advanced heart failure and EP as directed.  HTN:  Blood pressure is well-controlled in the office today.  Continue pharmacotherapy as outlined above.  HLD: LDL 37 in 01/2023.  Remains on Repatha , Vascepa , and Crestor  as outlined above.  OSA: Unable to tolerate CPAP.     Disposition: F/u with Dr. Gollan or an APP in 12 months, as well as advanced heart failure and EP as directed.    Medication Adjustments/Labs and Tests Ordered: Current medicines are reviewed at length with the patient today.  Concerns regarding medicines are outlined above. Medication changes, Labs and Tests ordered today are summarized above and listed in the Patient Instructions accessible in Encounters.   Signed, Varney Gentleman, PA-C 06/18/2023 1:24 PM     [facility] [place] [address] [place], [place] [address] [phone]

## 2023-06-18 ENCOUNTER — Ambulatory Visit: Attending: Physician Assistant | Admitting: Physician Assistant

## 2023-06-18 ENCOUNTER — Encounter: Payer: Self-pay | Admitting: Physician Assistant

## 2023-06-18 VITALS — BP 110/62 | HR 62 | Ht 68.0 in | Wt 213.8 lb

## 2023-06-18 DIAGNOSIS — E785 Hyperlipidemia, unspecified: Secondary | ICD-10-CM | POA: Diagnosis not present

## 2023-06-18 DIAGNOSIS — I10 Essential (primary) hypertension: Secondary | ICD-10-CM | POA: Diagnosis not present

## 2023-06-18 DIAGNOSIS — I447 Left bundle-branch block, unspecified: Secondary | ICD-10-CM | POA: Diagnosis not present

## 2023-06-18 DIAGNOSIS — Z95810 Presence of automatic (implantable) cardiac defibrillator: Secondary | ICD-10-CM | POA: Diagnosis not present

## 2023-06-18 DIAGNOSIS — I255 Ischemic cardiomyopathy: Secondary | ICD-10-CM | POA: Diagnosis not present

## 2023-06-18 DIAGNOSIS — I5022 Chronic systolic (congestive) heart failure: Secondary | ICD-10-CM | POA: Diagnosis not present

## 2023-06-18 DIAGNOSIS — I2510 Atherosclerotic heart disease of native coronary artery without angina pectoris: Secondary | ICD-10-CM | POA: Diagnosis not present

## 2023-06-18 DIAGNOSIS — G4733 Obstructive sleep apnea (adult) (pediatric): Secondary | ICD-10-CM | POA: Diagnosis not present

## 2023-06-18 DIAGNOSIS — Z951 Presence of aortocoronary bypass graft: Secondary | ICD-10-CM

## 2023-06-18 NOTE — Patient Instructions (Signed)
 Medication Instructions:  Your physician recommends that you continue on your current medications as directed. Please refer to the Current Medication list given to you today.   *If you need a refill on your cardiac medications before your next appointment, please call your pharmacy*  Lab Work: None ordered at this time   Follow-Up: At [facility], you and your health needs are our priority.  As part of our continuing mission to provide you with exceptional heart care, our providers are all part of one team.  This team includes your primary Cardiologist (physician) and Advanced Practice Providers or APPs (Physician Assistants and Nurse Practitioners) who all work together to provide you with the care you need, when you need it.  Your next appointment:   12 month(s)  Provider:   You may see Timothy Gollan, MD or Varney Gentleman, PA-C

## 2023-07-08 ENCOUNTER — Encounter: Payer: Self-pay | Admitting: Cardiology

## 2023-07-08 DIAGNOSIS — Z006 Encounter for examination for normal comparison and control in clinical research program: Secondary | ICD-10-CM

## 2023-07-08 NOTE — Research (Signed)
 CORDIO DETECT END OF STUDY VISIT  Called pt for EOS phone call. Did not answer, LVM to call research office back. Informed pt that Cordio App will be terminated on 15/Jul/2025.  VISIT TYPE  Date of Visit: [] [] -[] [] [] -[] [] [] []   (dd/mmm/yyyy)  Reason for Visit: ? Completed study  ? Study-wide endpoint achieved; subject completed study ? Subject Discontinued from Study (Dropout) - proceed to next question  Reason for Dropout: ? Adverse Event ? Subject decision/withdrew consent ? Lost to follow up  ? Discontinuation due to Investigator decision (describe):    ? Other (describe):      Did subject complete KCCQ questionnaire.   ? Completed ? Not Completed (explain):   Was subject alive at end of study? ? Yes ? No (specify date of death):  (dd-MMM-yyyy)  NYHA  NYHA Class: [] 1 [] 2 [] 3 [] 4    Adverse Events/Serious Adverse Events  AE/SAE identified: [] Yes [] No      CCQ  Visit Type: ? Screening    ? Unscheduled Visit       ? End of Study/Discontinuation Visit  Date Completed: [] [] -[] [] [] -[] [] [] []   (dd/mmm/yyyy)  Heart failure affects different people in different ways. Some feel shortness of breath while others feel fatigue. Please indicate how much you are limited by heart failure (shortness of breath or fatigue) in your ability to do the following activities over the past 2 weeks.   Extremely Limited Quite a bit  Limited Moderately Limited Slightly Limited Not at all Limited Limited for other reasons or did not do activity  Dressing yourself ? ? ? ? ? ?  Showering/Bathing ? ? ? ? ? ?  Walking 1 block on level ground ? ? ? ? ? ?  Doing yardwork, housework, or carrying groceries ? ? ? ? ? ?  Climbing a flight of stairs without stopping ? ? ? ? ? ?  Hurrying or Jogging (as if to catch a bus) ? ? ? ? ? ?  Compared with 2 weeks ago, have your symptoms of heart failure (shortness of breath, fatigue, or ankle swelling) changed? My symptoms of heart failure have become:  Much Worse  Slightly Worse Not Changed Slightly Better Much Better I've had no symptoms over the last 2 weeks  ? ? ? ? ? ?  Over the past 2 weeks, how many times did you have swelling in your feet, ankles, or legs when you woke up in the morning?  Every Morning 3 or more times a week, but not every day 1-2 times a week Less than once a week Never over the past 2 weeks  ? ? ? ? ?  Over the past 2 weeks, how much has swelling in your feet, ankles, or legs bothered you? It has been:  Extremely bothersome Quite a bit bothersome Moderately bothersome Slightly bothersome Not at all bothersome I've had no swelling  ? ? ? ? ? ?  Over the past 2 weeks, on average, how many times has fatigue limited your ability to do what you want?  All of the time Several times per day At least once a day 3 or more times a week, but not every day 1-2 times a week Less than once a week Never over the past 2 weeks  ? ? ? ? ? ? ?  Over the past 2 weeks, how much has your fatigue bothered you?  It has been:  Extremely bothersome Quite a bit bothersome Moderately bothersome Slightly bothersome Not at all bothersome I've had no  fatigue  ? ? ? ? ? ?  Over the past 2 weeks, on average, how many times has shortness of breath limited your ability to do what you wanted?  All of the time Several times per day At least once a day 3 or more times a week, but not every day 1-2 times a week Less than once a week Never over the past 2 weeks  ? ? ? ? ? ? ?  Over the past 2 weeks, how much has your shortness of breath bothered you?  It has been:  Extremely bothersome Quite a bit bothersome Moderately bothersome Slightly bothersome Not at all bothersome I've had no shortness of breath  ? ? ? ? ? ?  Over the past 2 weeks, on average, how many times have you been forced to sleep sitting up in a chair or with at least 3 pillows to prop you up because of shortness of breath?  Every Night 3 or more times a  week, but not every day 1-2 times a week Less than once a week Never over the past 2 weeks  ? ? ? ? ?  Heart failure symptoms can worsen for a number of reasons. How sure are you that you know what to do, or whom to call, if your heart failure gets worse?  Not at all sure Not very sure Somewhat sure Mostly sure Completely sure  ? ? ? ? ?  How well do you understand what things you are able to do to keep your heart failure symptoms from getting worse? (for example, weighing yourself, eating a low salt diet, etc.)  Do not understand at all Do not understand very well Somewhat understand Mostly understand Completely understand  ? ? ? ? ?  Over the past 2 weeks, how much has your heart failure limited your enjoyment of life?  It has extremely limited my enjoyment of life It has limited my enjoyment of life quite a bit It has moderately limited my enjoyment of life It has slightly limited my enjoyment of life It has not limited my enjoyment of life at all  ? ? ? ? ?  If you had to spend the rest of your life with your heart failure the way it is right now, how would you feel about this?  Not at all satisfied Mostly dissatisfied Somewhat dissatisfied Mostly satisfied Completely satisfied  ? ? ? ? ?  Over the past 2 weeks, how often have you felt discouraged or down in the dumps because of your heart failure?  I felt that way all of the time I felt that way most of the time I occasionally felt that way I rarely felt that way I never felt that way  ? ? ? ? ?  How much does your heart failure affect your lifestyle? Please indicate how your heart failure may have limited your participation in the following activities over the past 2 weeks.   Severely Limited Limited Quite a bit Moderately Limited Slightly Limited Did not Limit at all Does not apply or did not do for other reason  Hobbies, recreational activities ? ? ? ? ? ?  Working or doing household chores ? ? ? ? ? ?   Visiting family or friends out of your home ? ? ? ? ? ?  Intimate relationships with loved ones ? ? ? ? ? ?

## 2023-07-13 ENCOUNTER — Ambulatory Visit (INDEPENDENT_AMBULATORY_CARE_PROVIDER_SITE_OTHER): Payer: Managed Care, Other (non HMO)

## 2023-07-13 DIAGNOSIS — I255 Ischemic cardiomyopathy: Secondary | ICD-10-CM

## 2023-07-15 ENCOUNTER — Telehealth: Payer: Self-pay | Admitting: Cardiology

## 2023-07-15 LAB — CUP PACEART REMOTE DEVICE CHECK
Battery Remaining Longevity: 68 mo
Battery Remaining Percentage: 71 %
Battery Voltage: 2.99 V
Brady Statistic AP VP Percent: 24 %
Brady Statistic AP VS Percent: 1 %
Brady Statistic AS VP Percent: 76 %
Brady Statistic AS VS Percent: 1 %
Brady Statistic RA Percent Paced: 24 %
Date Time Interrogation Session: 20250707090047
HighPow Impedance: 69 Ohm
HighPow Impedance: 69 Ohm
Implantable Lead Connection Status: 753985
Implantable Lead Connection Status: 753985
Implantable Lead Connection Status: 753985
Implantable Lead Implant Date: 20230703
Implantable Lead Implant Date: 20230703
Implantable Lead Implant Date: 20230703
Implantable Lead Location: 753858
Implantable Lead Location: 753859
Implantable Lead Location: 753860
Implantable Lead Model: 3830
Implantable Lead Model: 7122
Implantable Pulse Generator Implant Date: 20230703
Lead Channel Impedance Value: 490 Ohm
Lead Channel Impedance Value: 560 Ohm
Lead Channel Impedance Value: 580 Ohm
Lead Channel Pacing Threshold Amplitude: 0.5 V
Lead Channel Pacing Threshold Amplitude: 0.75 V
Lead Channel Pacing Threshold Amplitude: 0.75 V
Lead Channel Pacing Threshold Pulse Width: 0.05 ms
Lead Channel Pacing Threshold Pulse Width: 0.5 ms
Lead Channel Pacing Threshold Pulse Width: 0.5 ms
Lead Channel Sensing Intrinsic Amplitude: 11.9 mV
Lead Channel Sensing Intrinsic Amplitude: 2.6 mV
Lead Channel Setting Pacing Amplitude: 0.25 V
Lead Channel Setting Pacing Amplitude: 2 V
Lead Channel Setting Pacing Amplitude: 2 V
Lead Channel Setting Pacing Pulse Width: 0.05 ms
Lead Channel Setting Pacing Pulse Width: 0.5 ms
Lead Channel Setting Sensing Sensitivity: 0.5 mV
Pulse Gen Serial Number: 8906747

## 2023-07-15 NOTE — Telephone Encounter (Signed)
 Called to confirm/remind patient of their appointment at [facility] on 07/16/23.   Appointment:   [] Confirmed  [x] Left mess   [] No answer/No voice mail  [] VM Full/unable to leave message  [] Phone not in service  Patient reminded to bring all medications and/or complete list.  Confirmed patient has transportation. Gave directions, instructed to utilize valet parking.

## 2023-07-16 ENCOUNTER — Ambulatory Visit: Attending: Cardiology | Admitting: Cardiology

## 2023-07-16 VITALS — BP 128/69 | HR 69 | Wt 218.2 lb

## 2023-07-16 DIAGNOSIS — Z7902 Long term (current) use of antithrombotics/antiplatelets: Secondary | ICD-10-CM | POA: Insufficient documentation

## 2023-07-16 DIAGNOSIS — I2510 Atherosclerotic heart disease of native coronary artery without angina pectoris: Secondary | ICD-10-CM | POA: Insufficient documentation

## 2023-07-16 DIAGNOSIS — Z955 Presence of coronary angioplasty implant and graft: Secondary | ICD-10-CM | POA: Insufficient documentation

## 2023-07-16 DIAGNOSIS — G4733 Obstructive sleep apnea (adult) (pediatric): Secondary | ICD-10-CM | POA: Insufficient documentation

## 2023-07-16 DIAGNOSIS — I255 Ischemic cardiomyopathy: Secondary | ICD-10-CM | POA: Insufficient documentation

## 2023-07-16 DIAGNOSIS — Z951 Presence of aortocoronary bypass graft: Secondary | ICD-10-CM | POA: Insufficient documentation

## 2023-07-16 DIAGNOSIS — Z7984 Long term (current) use of oral hypoglycemic drugs: Secondary | ICD-10-CM | POA: Insufficient documentation

## 2023-07-16 DIAGNOSIS — Z79899 Other long term (current) drug therapy: Secondary | ICD-10-CM | POA: Insufficient documentation

## 2023-07-16 DIAGNOSIS — Z9221 Personal history of antineoplastic chemotherapy: Secondary | ICD-10-CM | POA: Diagnosis not present

## 2023-07-16 DIAGNOSIS — Z87891 Personal history of nicotine dependence: Secondary | ICD-10-CM | POA: Diagnosis not present

## 2023-07-16 DIAGNOSIS — I5022 Chronic systolic (congestive) heart failure: Secondary | ICD-10-CM | POA: Insufficient documentation

## 2023-07-16 DIAGNOSIS — Z8571 Personal history of Hodgkin lymphoma: Secondary | ICD-10-CM | POA: Diagnosis not present

## 2023-07-16 DIAGNOSIS — Z95810 Presence of automatic (implantable) cardiac defibrillator: Secondary | ICD-10-CM | POA: Insufficient documentation

## 2023-07-16 MED ORDER — BISOPROLOL FUMARATE 5 MG PO TABS: 7.5000 mg | ORAL_TABLET | Freq: Every day | ORAL | 1 refills | Status: DC

## 2023-07-16 NOTE — Progress Notes (Signed)
 PCP: Sadie Manna, MD HF Cardiology: Dr. Rolan  Chief complaint: CHF  58 y.o. with history of CAD s/p CABG, ischemic cardiomyopathy/HFrEF, and prior Hodgkins lymphoma.  Patient had Hodgkins disease diagnosed in 1994 and was treated with chemotherapy and radiation.  A strong family history of CAD and also the chest wall radiation with Hodgkins treatment likely predisposed him to premature CAD.  He had CABG in 2013. Echo at the time of CABG showed EF 30%.  EF has been low since that time except for cMRI done in 2/19 which showed EF 43%.  Echo in 2/23 showed EF 20-25%, normal RV.  He had worsening dyspnea as well as chest pain in 1/23 and 2/23.  Cath revealed 90% proximal-mid RCA stenosis with atretic RIMA-PDA.  This was treated with DES x 2.  RHC showed prominent RV failure.    Echo in 5/23: EF 25-30% with septal-lateral dyssynchrony and mild RV dysfunction. Patient had St Jude ICD with left bundle lead placed.    Echo 11/23: EF 35%, mild RV dysfunction.   Echo 11/24: EF 35-40%  Here today for CHF follow-up. Symptoms remain about the same.  He gets short of breath after walking 5-10 minutes on a treadmill or walking up stairs.  No dyspnea with ADLs or just walking on flat ground.  No chest pain.  Goes to gym 3 days/week usually.  No orthopnea/PND.  Weight down 5 lbs.    Labs (12/24): BNP 104 Labs (1/25): LDL 37, K 4.7, creatinine 1.1, LFTs normal, hgb 15.7 Labs (4/25): K 4.7, creatinine 1.3, BNP 60  St Jude device interrogation: 99% left bundle pacing, 24% a-pacing, no VT, no AF, stable thoracic impedance.   PMH: 1. Hodgkins disease: Treated with chemotherapy and chest radiation in 1994.  2. CAD: CABG x 3 in 2013 with LIMA-LAD, RIMA-RCA, free radial-OM2.  - LHC (2/23): 90% proximal-mid RCA stenosis with atretic RIMA-RCA, patient had DES x 2 to proximal and mid RCA.  3. H/o COVID 4. Hyperlipidemia 5. Gout 6. Chronic systolic CHF: Ischemic cardiomyopathy.  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  - Echo (10/13): EF 30% - Echo (2018): EF 20-25%, moderate RV dysfunction.  - Cardiac MRI (2/19): EF 43%, minimal coronary pattern LGE.   - Echo (4/21): EF 20-25%, normal RV - Echo (2/23): EF 20-25%, normal RV - RHC (2/23): mean RA 18, PA 33/18 mean 23, PAPI < 1, CI 2.0 Fick/2.5 thermo.  - Echo (5/23): EF 25-30% with septal-lateral dyssynchrony and mild RV dysfunction. - Echo (11/23): EF 35%, mildly decreased RV systolic function.  - Echo (11/24): EF 35-40%, RV okay 7. OSA: Moderate.   Social History   Socioeconomic History   Marital status: Married    Spouse name: Not on file   Number of children: Not on file   Years of education: Not on file   Highest education level: Not on file  Occupational History   Not on file  Tobacco Use   Smoking status: Former    Current packs/day: 0.00    Types: Cigarettes    Quit date: 10/21/1996    Years since quitting: 26.7   Smokeless tobacco: Former    Types: Snuff    Quit date: 04/05/2019  Vaping Use   Vaping status: Never Used  Substance and Sexual Activity   Alcohol use: No   Drug use: No   Sexual activity: Yes  Other Topics Concern   Not on file  Social History Narrative   Not  on file   Social Drivers of Health   Financial Resource Strain: Low Risk  (07/23/2022)   Received [facility]   Overall Financial Resource Strain (CARDIA)    Difficulty of Paying Living Expenses: Not hard at all  Food Insecurity: No Food Insecurity (07/23/2022)   Received from [facility]   Hunger Vital Sign    Within the past 12 months, you worried that your food would run out before you got the money to buy more.: Never true    Within the past 12 months, the food you bought just didn't last and you didn't have money to get more.: Never true  Transportation Needs: No Transportation Needs (07/23/2022)   Received from [facility] - Transportation    In the past 12 months, has  lack of transportation kept you from medical appointments or from getting medications?: No    Lack of Transportation (Non-Medical): No  Physical Activity: Not on file  Stress: Not on file  Social Connections: Not on file  Intimate Partner Violence: Not on file   Family History  Problem Relation Age of Onset   Coronary artery disease Other    Heart disease Mother    Heart attack Father    ROS: All systems reviewed and negative except as per HPI.   Current Outpatient Medications  Medication Sig Dispense Refill   clopidogrel  (PLAVIX ) 75 MG tablet TAKE ONE TABLET BY MOUTH ONE TIME DAILY WITH BREAKFAST 90 tablet 0   colchicine 0.6 MG tablet Take 0.6 mg by mouth daily as needed (GOUT FLARES).     eplerenone  (INSPRA ) 25 MG tablet Take 2 tablets (50 mg total) by mouth at bedtime. 60 tablet 11   Evolocumab  (REPATHA  SURECLICK) 140 MG/ML SOAJ INJECT 140MG  INTO THE SKIN EVERY 14 DAYS 2 mL 11   FARXIGA  10 MG TABS tablet TAKE ONE TABLET BY MOUTH ONE TIME DAILY WITH BREAKFAST 90 tablet 3   furosemide  (LASIX ) 40 MG tablet Take 0.5 tablets (20 mg total) by mouth as needed. For weight gain of 3lb in 24 hours or 5 lb in a week 30 tablet 3   icosapent  Ethyl (VASCEPA ) 1 g capsule TAKE 2 CAPSULES BY MOUTH TWICE A DAY. 120 capsule 11   pantoprazole  (PROTONIX ) 40 MG tablet Take 40 mg by mouth in the morning.     rosuvastatin  (CRESTOR ) 40 MG tablet Take 1 tablet (40 mg total) by mouth daily. No further refills until seen in office. Thank you 15 tablet 0   sacubitril -valsartan  (ENTRESTO ) 49-51 MG Take 1 tablet by mouth 2 (two) times daily. 60 tablet 11   senna (SENOKOT) 8.6 MG tablet Take 4 tablets by mouth daily with supper.     sodium polystyrene (KAYEXALATE ) 15 GM/60ML suspension Take 15 g by mouth as needed.     traZODone (DESYREL) 50 MG tablet Take 50 mg by mouth at bedtime.     vitamin B-12 (CYANOCOBALAMIN) 1000 MCG tablet Take 1 tablet (1,000 mcg total) by mouth daily. 30 tablet 1   bisoprolol  (ZEBETA ) 5  MG tablet Take 1.5 tablets (7.5 mg total) by mouth daily. 135 tablet 1   No current facility-administered medications for this visit.   Wt Readings from Last 3 Encounters:  07/16/23 218 lb 3.2 oz (99 kg)  06/18/23 213 lb 12.8 oz (97 kg)  04/10/23 218 lb 6.4 oz (99.1 kg)   BP 128/69   Pulse 69   Wt 218 lb 3.2 oz (  99 kg)   SpO2 95%   BMI 33.18 kg/m  General: NAD Neck: No JVD, no thyromegaly or thyroid  nodule.  Lungs: Clear to auscultation bilaterally with normal respiratory effort. CV: Nondisplaced PMI.  Heart regular S1/S2, no S3/S4, no murmur.  No peripheral edema.  No carotid bruit.  Normal pedal pulses.  Abdomen: Soft, nontender, no hepatosplenomegaly, no distention.  Skin: Intact without lesions or rashes.  Neurologic: Alert and oriented x 3.  Psych: Normal affect. Extremities: No clubbing or cyanosis.  HEENT: Normal.   Assessment/Plan: 1. CAD: s/p CABG in 2013 and DES x 2 to native RCA in 2/23 with atretic RIMA-PDA noted. No chest pain.  - Now on Plavix  75 mg daily - Continue Crestor  and Repatha , good LDL in 1/25.  2. Hyperlipidemia: He is now on Repatha , Crestor , Vascepa .  3. Chronic systolic CHF: Ischemic cardiomyopathy. Echo in 2/23 with EF 20-25%, normal RV.  RHC in 2/23 showed prominent RV failure with PAPI < 1.  Echo in 5/23 showed EF 25-30% with septal-lateral dyssynchrony and mild RV dysfunction.  St Jude ICD with left bundle lead was placed.  Echo 11/23 showed EF slightly higher at 35%.  Echo 11/24 with EF 35-40%, RV okay.  NYHA class II, not volume overloaded by exam or Corvue.  - Continue Entresto  49/51 bid, BMET/BNP today.  - Continue Eplerenone  50 mg daily and Kayexalate  15 g daily (Lokelma  too expensive). BMET today.  - Increase bisoprolol  to 7.5 mg daily.    - Continue Farxiga  10 mg daily. - I will arrange for repeat echo in 11/25.  4. OSA: Unable to tolerate CPAP.  Follow up in 11/25 with me with echo.   I spent 32 minutes reviewing records,  interviewing/examining patient, and managing orders.   Willie [name]  07/16/2023

## 2023-07-16 NOTE — Patient Instructions (Signed)
 Medication Changes:  INCREASE Bisoprolol  to 7.5mg  ( 1.5 tab) daily  Lab Work:  Go over to the [facility]. Go pass the gift shop and have your blood work completed.  We will only call you if the results are abnormal or if the provider would like to make medication changes.   Testing/Procedures:  Your physician has requested that you have an echocardiogram. Echocardiography is a painless test that uses sound waves to create images of your heart. It provides your doctor with information about the size and shape of your heart and how well your heart's chambers and valves are working. This procedure takes approximately one hour. There are no restrictions for this procedure. Please do NOT wear cologne, perfume, aftershave, or lotions (deodorant is allowed). Please arrive 15 minutes prior to your appointment time.  Please note: We ask at that you not bring children with you during ultrasound (echo/ vascular) testing. Due to room size and safety concerns, children are not allowed in the ultrasound rooms during exams. Our front office staff cannot provide observation of children in our lobby area while testing is being conducted. An adult accompanying a patient to their appointment will only be allowed in the ultrasound room at the discretion of the ultrasound technician under special circumstances. We apologize for any inconvenience.   Follow-Up in: Please follow up with the [facility] in November 2025 with Dr. Rolan. We do not currently have the schedule for November. Please call us  in October in order to schedule your appointment for November.  At the [facility], you and your health needs are our priority. We have a designated team specialized in the treatment of Heart Failure. This Care Team includes your primary Heart Failure Specialized Cardiologist (physician), Advanced Practice Providers (APPs- Physician Assistants and Nurse Practitioners), and Pharmacist  who all work together to provide you with the care you need, when you need it.   You may see any of the following providers on your designated Care Team at your next follow up:  Dr. Toribio Fuel Dr. Ezra Rolan Dr. Ria Commander Dr. Odis Brownie Ellouise Class, FNP Jaun Bash, RPH-CPP  Please be sure to bring in all your medications bottles to every appointment.   Need to Contact Us :  If you have any questions or concerns before your next appointment please send us  a message through [place] or call our office at [phone].    TO LEAVE A MESSAGE FOR THE NURSE SELECT OPTION 2, PLEASE LEAVE A MESSAGE INCLUDING: YOUR NAME DATE OF BIRTH CALL BACK NUMBER REASON FOR CALL**this is important as we prioritize the call backs  YOU WILL RECEIVE A CALL BACK THE SAME DAY AS LONG AS YOU CALL BEFORE 4:00 PM

## 2023-07-17 ENCOUNTER — Other Ambulatory Visit
Admission: RE | Admit: 2023-07-17 | Discharge: 2023-07-17 | Disposition: A | Discharge status: Home / Self Care | Source: Ambulatory Visit | Attending: Cardiology | Admitting: Cardiology

## 2023-07-17 DIAGNOSIS — I255 Ischemic cardiomyopathy: Secondary | ICD-10-CM | POA: Insufficient documentation

## 2023-07-17 DIAGNOSIS — R0602 Shortness of breath: Secondary | ICD-10-CM | POA: Insufficient documentation

## 2023-07-17 LAB — BASIC METABOLIC PANEL WITH GFR
Anion gap: 8 (ref 5–15)
BUN: 13 mg/dL (ref 6–20)
CO2: 26 mmol/L (ref 22–32)
Calcium: 9 mg/dL (ref 8.9–10.3)
Chloride: 105 mmol/L (ref 98–111)
Creatinine, Ser: 1.04 mg/dL (ref 0.61–1.24)
GFR, Estimated: >60 mL/min (ref >60)
Glucose, Bld: 100 mg/dL — ABNORMAL HIGH (ref 70–99)
Potassium: 3.8 mmol/L (ref 3.5–5.1)
Sodium: 139 mmol/L (ref 135–145)

## 2023-07-17 LAB — BRAIN NATRIURETIC PEPTIDE: B Natriuretic Peptide: 217.6 pg/mL — ABNORMAL HIGH (ref 0.0–100.0)

## 2023-07-18 ENCOUNTER — Ambulatory Visit: Payer: Self-pay | Admitting: Cardiology

## 2023-07-19 ENCOUNTER — Ambulatory Visit (HOSPITAL_COMMUNITY): Payer: Self-pay | Admitting: Cardiology

## 2023-07-21 DIAGNOSIS — G4709 Other insomnia: Secondary | ICD-10-CM | POA: Diagnosis not present

## 2023-07-21 DIAGNOSIS — R7309 Other abnormal glucose: Secondary | ICD-10-CM | POA: Diagnosis not present

## 2023-07-21 DIAGNOSIS — I5022 Chronic systolic (congestive) heart failure: Secondary | ICD-10-CM | POA: Diagnosis not present

## 2023-07-21 DIAGNOSIS — Z951 Presence of aortocoronary bypass graft: Secondary | ICD-10-CM | POA: Diagnosis not present

## 2023-07-21 DIAGNOSIS — I2510 Atherosclerotic heart disease of native coronary artery without angina pectoris: Secondary | ICD-10-CM | POA: Diagnosis not present

## 2023-07-21 DIAGNOSIS — Z8571 Personal history of Hodgkin lymphoma: Secondary | ICD-10-CM | POA: Diagnosis not present

## 2023-07-21 DIAGNOSIS — Z8739 Personal history of other diseases of the musculoskeletal system and connective tissue: Secondary | ICD-10-CM | POA: Diagnosis not present

## 2023-07-21 DIAGNOSIS — C8199 Hodgkin lymphoma, unspecified, extranodal and solid organ sites: Secondary | ICD-10-CM | POA: Diagnosis not present

## 2023-07-21 DIAGNOSIS — I10 Essential (primary) hypertension: Secondary | ICD-10-CM | POA: Diagnosis not present

## 2023-07-28 DIAGNOSIS — Z951 Presence of aortocoronary bypass graft: Secondary | ICD-10-CM | POA: Diagnosis not present

## 2023-07-28 DIAGNOSIS — Z0000 Encounter for general adult medical examination without abnormal findings: Secondary | ICD-10-CM | POA: Diagnosis not present

## 2023-07-28 DIAGNOSIS — I2510 Atherosclerotic heart disease of native coronary artery without angina pectoris: Secondary | ICD-10-CM | POA: Diagnosis not present

## 2023-07-28 DIAGNOSIS — E669 Obesity, unspecified: Secondary | ICD-10-CM | POA: Diagnosis not present

## 2023-07-28 DIAGNOSIS — I5022 Chronic systolic (congestive) heart failure: Secondary | ICD-10-CM | POA: Diagnosis not present

## 2023-07-28 DIAGNOSIS — Z1331 Encounter for screening for depression: Secondary | ICD-10-CM | POA: Diagnosis not present

## 2023-07-28 DIAGNOSIS — I255 Ischemic cardiomyopathy: Secondary | ICD-10-CM | POA: Diagnosis not present

## 2023-07-28 DIAGNOSIS — R7309 Other abnormal glucose: Secondary | ICD-10-CM | POA: Diagnosis not present

## 2023-07-28 DIAGNOSIS — Z8571 Personal history of Hodgkin lymphoma: Secondary | ICD-10-CM | POA: Diagnosis not present

## 2023-07-28 DIAGNOSIS — Z6833 Body mass index (BMI) 33.0-33.9, adult: Secondary | ICD-10-CM | POA: Diagnosis not present

## 2023-08-06 ENCOUNTER — Encounter: Payer: Self-pay | Admitting: Cardiology

## 2023-08-06 ENCOUNTER — Other Ambulatory Visit: Payer: Self-pay

## 2023-08-06 MED ORDER — SODIUM POLYSTYRENE SULFONATE 15 GM/60ML CO SUSP: 15.0000 g | 0 refills | Status: DC | PRN

## 2023-08-25 DIAGNOSIS — Z951 Presence of aortocoronary bypass graft: Secondary | ICD-10-CM | POA: Diagnosis not present

## 2023-08-25 DIAGNOSIS — Z8571 Personal history of Hodgkin lymphoma: Secondary | ICD-10-CM | POA: Diagnosis not present

## 2023-08-25 DIAGNOSIS — M5442 Lumbago with sciatica, left side: Secondary | ICD-10-CM | POA: Diagnosis not present

## 2023-08-25 DIAGNOSIS — I2510 Atherosclerotic heart disease of native coronary artery without angina pectoris: Secondary | ICD-10-CM | POA: Diagnosis not present

## 2023-08-25 DIAGNOSIS — I255 Ischemic cardiomyopathy: Secondary | ICD-10-CM | POA: Diagnosis not present

## 2023-09-03 ENCOUNTER — Other Ambulatory Visit (HOSPITAL_COMMUNITY): Payer: Self-pay

## 2023-09-03 ENCOUNTER — Telehealth: Payer: Self-pay | Admitting: Pharmacy Technician

## 2023-09-03 NOTE — Telephone Encounter (Signed)
 Pharmacy Patient Advocate Encounter   Received notification from Fax that prior authorization for Repatha  is required/requested.   Insurance verification completed.   The patient is insured through [facility] ADVANTAGE/RX ADVANCE .   Per test claim: PA required; PA submitted to above mentioned insurance via Latent Key/confirmation #/[id number] Status is pending

## 2023-09-03 NOTE — Telephone Encounter (Signed)
 Pharmacy Patient Advocate Encounter  Received notification from [employer] that Prior Authorization for Repatha  has been APPROVED from 09/03/23 to 09/02/24. Ran test claim, Copay is $0.00 one month. This test claim was processed through [facility]- copay amounts may vary at other pharmacies due to pharmacy/plan contracts, or as the patient moves through the different stages of their insurance plan.   PA #/Case ID/Reference #: [id number]

## 2023-09-08 ENCOUNTER — Other Ambulatory Visit: Payer: Self-pay

## 2023-09-09 MED ORDER — CLOPIDOGREL BISULFATE 75 MG PO TABS: ORAL_TABLET | ORAL | 2 refills | Status: AC

## 2023-09-16 DIAGNOSIS — K0889 Other specified disorders of teeth and supporting structures: Secondary | ICD-10-CM | POA: Diagnosis not present

## 2023-09-30 ENCOUNTER — Encounter: Payer: Self-pay | Admitting: Cardiology

## 2023-10-01 MED ORDER — FUROSEMIDE 20 MG PO TABS: 20.0000 mg | ORAL_TABLET | Freq: Every day | ORAL | 2 refills | Status: AC | PRN

## 2023-10-01 MED ORDER — SACUBITRIL-VALSARTAN 49-51 MG PO TABS: 1.0000 | ORAL_TABLET | Freq: Two times a day (BID) | ORAL | 2 refills | Status: DC

## 2023-10-01 MED ORDER — ROSUVASTATIN CALCIUM 40 MG PO TABS: 40.0000 mg | ORAL_TABLET | Freq: Every day | ORAL | 2 refills | Status: DC

## 2023-10-01 MED ORDER — DAPAGLIFLOZIN PROPANEDIOL 10 MG PO TABS: 10.0000 mg | ORAL_TABLET | Freq: Every day | ORAL | 2 refills | Status: AC

## 2023-10-01 MED ORDER — ROSUVASTATIN CALCIUM 40 MG PO TABS: 40.0000 mg | ORAL_TABLET | Freq: Every day | ORAL | 2 refills | Status: AC

## 2023-10-01 MED ORDER — EPLERENONE 25 MG PO TABS: 50.0000 mg | ORAL_TABLET | Freq: Every day | ORAL | 2 refills | Status: AC

## 2023-10-01 MED ORDER — BISOPROLOL FUMARATE 5 MG PO TABS: 7.5000 mg | ORAL_TABLET | Freq: Every day | ORAL | 2 refills | Status: DC

## 2023-10-01 MED ORDER — SACUBITRIL-VALSARTAN 49-51 MG PO TABS: 1.0000 | ORAL_TABLET | Freq: Two times a day (BID) | ORAL | 2 refills | Status: AC

## 2023-10-01 MED ORDER — EPLERENONE 25 MG PO TABS: 50.0000 mg | ORAL_TABLET | Freq: Every day | ORAL | 2 refills | Status: DC

## 2023-10-01 MED ORDER — DAPAGLIFLOZIN PROPANEDIOL 10 MG PO TABS: 10.0000 mg | ORAL_TABLET | Freq: Every day | ORAL | 2 refills | Status: DC

## 2023-10-01 MED ORDER — BISOPROLOL FUMARATE 5 MG PO TABS: 7.5000 mg | ORAL_TABLET | Freq: Every day | ORAL | 2 refills | Status: AC

## 2023-10-01 MED ORDER — FUROSEMIDE 20 MG PO TABS: 20.0000 mg | ORAL_TABLET | Freq: Every day | ORAL | 2 refills | Status: DC | PRN

## 2023-10-06 ENCOUNTER — Encounter: Payer: Self-pay | Admitting: Cardiology

## 2023-10-06 ENCOUNTER — Other Ambulatory Visit: Payer: Self-pay

## 2023-10-06 MED ORDER — REPATHA SURECLICK 140 MG/ML ~~LOC~~ SOAJ: SUBCUTANEOUS | 11 refills | Status: AC

## 2023-10-07 ENCOUNTER — Telehealth: Payer: Self-pay | Admitting: Pharmacy Technician

## 2023-10-07 ENCOUNTER — Telehealth (HOSPITAL_COMMUNITY): Payer: Self-pay | Admitting: Pharmacy Technician

## 2023-10-07 ENCOUNTER — Other Ambulatory Visit (HOSPITAL_COMMUNITY): Payer: Self-pay

## 2023-10-07 NOTE — Telephone Encounter (Signed)
      I called [facility] and told them this was already approved. Test claim went through just fine. They had a different ndc. Now went thru

## 2023-10-07 NOTE — Telephone Encounter (Signed)
 Patient Product/process development scientist completed.    The patient is insured through [employer]/ Rx Advance. Patient has Medicare and is not eligible for a copay card, but may be able to apply for patient assistance or Medicare RX Payment Plan (Patient Must reach out to their plan, if eligible for payment plan), if available.    Ran test claim for Repatha  Sureclick 140 mg/ml and the current 30 day co-pay is $0.00.   This test claim was processed through [facility]- copay amounts may vary at other pharmacies due to pharmacy/plan contracts, or as the patient moves through the different stages of their insurance plan.     Reyes Sharps, CPHT Pharmacy Technician III Certified Patient Advocate [facility] Patient Advocate Team Direct Number: [phone]  Fax: [phone]

## 2023-10-12 ENCOUNTER — Ambulatory Visit (INDEPENDENT_AMBULATORY_CARE_PROVIDER_SITE_OTHER): Payer: Managed Care, Other (non HMO)

## 2023-10-12 DIAGNOSIS — I255 Ischemic cardiomyopathy: Secondary | ICD-10-CM

## 2023-10-14 LAB — CUP PACEART REMOTE DEVICE CHECK
Battery Remaining Longevity: 66 mo
Battery Remaining Percentage: 69 %
Battery Voltage: 2.99 V
Brady Statistic AP VP Percent: 25 %
Brady Statistic AP VS Percent: 1 %
Brady Statistic AS VP Percent: 75 %
Brady Statistic AS VS Percent: 1 %
Brady Statistic RA Percent Paced: 25 %
Date Time Interrogation Session: 20251006193428
HighPow Impedance: 77 Ohm
HighPow Impedance: 77 Ohm
Implantable Lead Connection Status: 753985
Implantable Lead Connection Status: 753985
Implantable Lead Connection Status: 753985
Implantable Lead Implant Date: 20230703
Implantable Lead Implant Date: 20230703
Implantable Lead Implant Date: 20230703
Implantable Lead Location: 753858
Implantable Lead Location: 753859
Implantable Lead Location: 753860
Implantable Lead Model: 3830
Implantable Lead Model: 7122
Implantable Pulse Generator Implant Date: 20230703
Lead Channel Impedance Value: 480 Ohm
Lead Channel Impedance Value: 590 Ohm
Lead Channel Impedance Value: 610 Ohm
Lead Channel Pacing Threshold Amplitude: 0.5 V
Lead Channel Pacing Threshold Amplitude: 0.75 V
Lead Channel Pacing Threshold Amplitude: 0.75 V
Lead Channel Pacing Threshold Pulse Width: 0.05 ms
Lead Channel Pacing Threshold Pulse Width: 0.5 ms
Lead Channel Pacing Threshold Pulse Width: 0.5 ms
Lead Channel Sensing Intrinsic Amplitude: 11.9 mV
Lead Channel Sensing Intrinsic Amplitude: 2.8 mV
Lead Channel Setting Pacing Amplitude: 0.25 V
Lead Channel Setting Pacing Amplitude: 2 V
Lead Channel Setting Pacing Amplitude: 2 V
Lead Channel Setting Pacing Pulse Width: 0.05 ms
Lead Channel Setting Pacing Pulse Width: 0.5 ms
Lead Channel Setting Sensing Sensitivity: 0.5 mV
Pulse Gen Serial Number: 8906747

## 2023-10-15 NOTE — Progress Notes (Signed)
 Remote ICD Transmission

## 2023-10-19 ENCOUNTER — Ambulatory Visit: Payer: Self-pay | Admitting: Cardiology

## 2023-10-19 NOTE — Progress Notes (Signed)
 Remote ICD Transmission

## 2023-10-29 NOTE — Addendum Note (Signed)
 Addended by: Vonzella Althaus A on: 10/29/2023 05:00 PM   Modules accepted: Orders

## 2023-10-30 ENCOUNTER — Other Ambulatory Visit: Payer: Self-pay

## 2023-10-30 MED ORDER — SODIUM POLYSTYRENE SULFONATE 15 GM/60ML CO SUSP: 15.0000 g | 2 refills | Status: DC | PRN

## 2023-11-02 ENCOUNTER — Other Ambulatory Visit: Payer: Self-pay

## 2023-11-02 ENCOUNTER — Encounter: Payer: Self-pay | Admitting: Cardiology

## 2023-11-02 MED ORDER — SODIUM POLYSTYRENE SULFONATE 15 GM/60ML CO SUSP: 15.0000 g | Freq: Every day | 2 refills | Status: DC | PRN

## 2023-11-03 ENCOUNTER — Other Ambulatory Visit (HOSPITAL_COMMUNITY): Payer: Self-pay

## 2023-11-03 DIAGNOSIS — Z6834 Body mass index (BMI) 34.0-34.9, adult: Secondary | ICD-10-CM | POA: Diagnosis not present

## 2023-11-03 DIAGNOSIS — Z8571 Personal history of Hodgkin lymphoma: Secondary | ICD-10-CM | POA: Diagnosis not present

## 2023-11-03 DIAGNOSIS — I255 Ischemic cardiomyopathy: Secondary | ICD-10-CM | POA: Diagnosis not present

## 2023-11-03 DIAGNOSIS — I10 Essential (primary) hypertension: Secondary | ICD-10-CM | POA: Diagnosis not present

## 2023-11-03 DIAGNOSIS — Z951 Presence of aortocoronary bypass graft: Secondary | ICD-10-CM | POA: Diagnosis not present

## 2023-11-03 DIAGNOSIS — Z95810 Presence of automatic (implantable) cardiac defibrillator: Secondary | ICD-10-CM | POA: Diagnosis not present

## 2023-11-03 DIAGNOSIS — H9313 Tinnitus, bilateral: Secondary | ICD-10-CM | POA: Diagnosis not present

## 2023-11-03 DIAGNOSIS — G4733 Obstructive sleep apnea (adult) (pediatric): Secondary | ICD-10-CM | POA: Diagnosis not present

## 2023-11-03 MED ORDER — LOKELMA 10 G PO PACK: 10.0000 g | PACK | Freq: Every day | ORAL | 1 refills | Status: AC

## 2023-11-03 NOTE — Progress Notes (Signed)
 Chief Complaint  Patient presents with  . Otalgia    HPI  Willie [name] is a 58 y.o. here for an Acute visit  Hx of CAD- CABG- approx 2 yrs back ([place]) Gout and ischemic cardiomyopathy Hx of AICD placement in July 2023 C/o Ringing in both ears - right worse than left  Denies chest pains or shortness of breath Hx of kidney stones - had 1 episode in June 2015. 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 Recent labs: Hgb; 16.1, Sugar; 97,Se Creat: 1.2 A1c: 5.9, Total Cholesterol:110 Triglycerides; 236, HDL; 30.3  and PSA: 2.49 and TSH; 3.352      ROS Rest of 10 point review of systems is normal.  Outpatient Encounter Medications as of 11/03/2023  Medication Sig Dispense Refill  . aspirin  81 MG EC tablet Take 81 mg by mouth once daily    . bisoprolol  (ZEBETA ) 5 MG tablet Take 5 mg by mouth once daily    . clopidogreL  (PLAVIX ) 75 mg tablet Take 1 tablet by mouth once daily    . COLCRYS 0.6 mg tablet Take 1 tablet (0.6 mg total) by mouth 2 (two) times daily 60 tablet 5  . cyanocobalamin (VITAMIN B12) 1000 MCG tablet TAKE ONE TABLET BY MOUTH ONE TIME DAILY 130 tablet 1  . ENTRESTO  24-26 mg tablet Take 1 tablet by mouth 2 (two) times daily    . eplerenone  (INSPRA ) 25 MG tablet Take 1 tablet by mouth once daily    . evolocumab  (REPATHA  SURECLICK) 140 mg/mL PnIj Inject 1 pen subcutaneously every 14 (fourteen) days    . ezetimibe  (ZETIA ) 10 mg tablet Take 1 tablet by mouth once daily    . FARXIGA  10 mg tablet Take 10 mg by mouth once daily    . FUROsemide  (LASIX ) 40 MG tablet Take 0.5 mg by mouth once daily as needed for Edema    . icosapent  ethyL (VASCEPA ) 1 gram capsule Take 2 g by mouth 2 (two) times daily    . meloxicam (MOBIC) 7.5 MG tablet Take 1 tablet (7.5 mg total) by mouth once daily as needed 30 tablet 1  . pantoprazole  (PROTONIX ) 40 MG DR tablet Take 1 tablet (40 mg  total) by mouth once daily 90 tablet 1  . predniSONE (DELTASONE) 10 MG tablet 3,3,2,2,,1,1, and stop 12 tablet 0  . rosuvastatin  (CRESTOR ) 40 MG tablet Take 1 tablet (40 mg total) by mouth once daily 90 tablet 1  . sennosides (SENOKOT) 8.6 mg tablet Take 4 tablets by mouth at bedtime    . sodium zirconium cyclosilicate  (LOKELMA ) 10 gram packet Take 10 g by mouth once daily    . SPS, WITH SORBITOL, oral suspension TAKE 60 ML BY MOUTH DAILY AS NEEDED    . traZODone (DESYREL) 50 MG tablet Take 1 tablet (50 mg total) by mouth at bedtime 90 tablet 1   No facility-administered encounter medications on file as of 11/03/2023.    Allergies as of 11/03/2023 - Reviewed 08/25/2023  Allergen Reaction Noted  . Spironolactone  Unknown 09/15/2017    Past Medical History:  Diagnosis Date  . CAD (coronary artery disease)   . Gout   . Hodgkin's lymphoma (CMS/HHS-HCC)   . Hyperlipidemia   . Hypertension   . Left bundle branch block   . Left ventricular dysfunction    EF 30%    Past Surgical History:  Procedure Laterality Date  . CORONARY ARTERY BYPASS GRAFT  10/24/2011   x3  . Colon @ [facility]  04/10/2023   Tubular adenomas/Hyperplastic polyps/Repeat 57yrs/SMR    Vitals:   11/03/23 0825  BP: 122/72  Pulse: 68      Exam    Blood pressure 122/72, pulse 68, height 172.7 cm (5' 8), weight (!) 103.1 kg (227 lb 3.2 oz), SpO2 97%.  Wt Readings from Last 3 Encounters:  11/03/23 (!) 103.1 kg (227 lb 3.2 oz)  09/16/23 100.2 kg (221 lb)  08/25/23 (!) 101 kg (222 lb 9.6 oz)  Body mass index is 34.55 kg/m.   General. Alert oriented x3  Not in distress Eyes. Sclera and conjunctiva clear; pupils equal round and reactive to light  extraocular movements intact Ears. External normal; canals clear; tympanic membranes normal Nose. Mucosa healthy without drainage or ulceration Neck. No swelling, masses, No JVD No  stiffness, pain, limited movement, carotid pulses normal bilaterally, thyroid  normal  size, no masses palpated.  No bruits Lungs. Respirations unlabored; clear to auscultation bilaterally Back. No spinal deformity Cardiovascular. Heart regular rate and rhythm without murmurs, gallops, or rubs Abdomen. Soft; non tender; non distended; normoactive bowel sounds; no masses or organomegaly  RECTAL: Declined  Lymph Nodes. No significant cervical, supraclavicular, axillary or inguinal lymphadenopathy noted Extremities. Normal, no edema Tenderness + Lower lumbar spine Decreased SLR on left to 55 degrees Neurologic. Alert and oriented; speech intact; face symmetrical; moves all extremities well   Assessment and Plan 1 Tinnitus both ears- Rt worse than left  Trial of Gabapentin 100 mg po qhs  If no better- consider ENT referral  2 CAD- s/p CABG- Oct 2013 and systolic CHF/ Ischemic cardiomyopathy - S/p [place] and AICD  EF: 35 % On Inspra , Farxiga ,Secubitril/Valsartan . Furosemide   and Bisoprolol  Sees Dr. Rolan  3 Hx of Gout; No recent flare up 4 Hx of Hodgkin's disease: Asymptomatic No longer goes to cancer center  5 Mixed Hyperlipidemia/Low HDL: On Crestor  40 mg po qd and Zetia   6 Elevated A1c; (5.9) Rec low carb diet  7 Insomnia; Doing well. On Trazodone 50 mg po qhs 8 Health maintenance Up to date with Pneumovax and Inj Prevnar 13  Cologuard test  Continue efforts at weight reduction  Discussed results of labs  Check labs 1 week prior to next visit  Follow up as scheduled     Tamra Leventhal  MD

## 2023-11-09 ENCOUNTER — Encounter: Payer: Self-pay | Admitting: Cardiology

## 2023-11-10 ENCOUNTER — Ambulatory Visit
Admission: RE | Admit: 2023-11-10 | Discharge: 2023-11-10 | Disposition: A | Discharge status: Home / Self Care | Source: Ambulatory Visit | Attending: Cardiology | Admitting: Cardiology

## 2023-11-10 DIAGNOSIS — I2510 Atherosclerotic heart disease of native coronary artery without angina pectoris: Secondary | ICD-10-CM | POA: Diagnosis not present

## 2023-11-10 DIAGNOSIS — I509 Heart failure, unspecified: Secondary | ICD-10-CM | POA: Diagnosis not present

## 2023-11-10 DIAGNOSIS — I255 Ischemic cardiomyopathy: Secondary | ICD-10-CM | POA: Diagnosis not present

## 2023-11-10 DIAGNOSIS — I110 Hypertensive heart disease with heart failure: Secondary | ICD-10-CM | POA: Insufficient documentation

## 2023-11-10 DIAGNOSIS — Z951 Presence of aortocoronary bypass graft: Secondary | ICD-10-CM | POA: Diagnosis not present

## 2023-11-10 LAB — ECHOCARDIOGRAM COMPLETE
AR max vel: 4.01 cm2
AV Area VTI: 4.22 cm2
AV Area mean vel: 3.99 cm2
AV Mean grad: 1.3 mmHg
AV Peak grad: 2.7 mmHg
Ao pk vel: 0.83 m/s
Area-P 1/2: 2.74 cm2
Calc EF: 19.2 %
MV VTI: 2.46 cm2
S' Lateral: 4.21 cm
Single Plane A2C EF: 12.5 %
Single Plane A4C EF: 30.4 %

## 2023-11-10 NOTE — Progress Notes (Signed)
*  PRELIMINARY RESULTS* Echocardiogram 2D Echocardiogram has been performed.  Willie [name] 11/10/2023, 10:36 AM

## 2023-11-11 ENCOUNTER — Encounter: Payer: Self-pay | Admitting: Cardiology

## 2023-11-30 ENCOUNTER — Encounter: Payer: Self-pay | Admitting: Dermatology

## 2023-11-30 ENCOUNTER — Ambulatory Visit: Admitting: Dermatology

## 2023-11-30 DIAGNOSIS — L820 Inflamed seborrheic keratosis: Secondary | ICD-10-CM

## 2023-11-30 NOTE — Patient Instructions (Signed)
 Cryotherapy Aftercare  Wash gently with soap and water everyday.   Apply Vaseline Jelly daily until healed.     Recommend daily broad spectrum sunscreen SPF 30+ to sun-exposed areas, reapply every 2 hours as needed. Call for new or changing lesions.  Staying in the shade or wearing long sleeves, sun glasses (UVA+UVB protection) and wide brim hats (4-inch brim around the entire circumference of the hat) are also recommended for sun protection.      Due to recent changes in healthcare laws, you may see results of your pathology and/or laboratory studies on MyChart before the doctors have had a chance to review them. We understand that in some cases there may be results that are confusing or concerning to you. Please understand that not all results are received at the same time and often the doctors may need to interpret multiple results in order to provide you with the best plan of care or course of treatment. Therefore, we ask that you please give us  2 business days to thoroughly review all your results before contacting the office for clarification. Should we see a critical lab result, you will be contacted sooner.   If You Need Anything After Your Visit  If you have any questions or concerns for your doctor, please call our main line at [phone] and press option 4 to reach your doctor's medical assistant. If no one answers, please leave a voicemail as directed and we will return your call as soon as possible. Messages left after 4 pm will be answered the following business day.   You may also send us  a message via MyChart. We typically respond to MyChart messages within 1-2 business days.  For prescription refills, please ask your pharmacy to contact our office. Our fax number is [phone].  If you have an urgent issue when the clinic is closed that cannot wait until the next business day, you can page your doctor at the number below.    Please note that while we do our best to be  available for urgent issues outside of office hours, we are not available 24/7.   If you have an urgent issue and are unable to reach us , you may choose to seek medical care at your doctor's office, retail clinic, urgent care center, or emergency room.  If you have a medical emergency, please immediately call 911 or go to the emergency department.  Pager Numbers  - Dr. Hester: [phone]  - Dr. Jackquline: [phone]  - Dr. Claudene: [phone]   - Dr. Raymund: [phone]  In the event of inclement weather, please call our main line at [phone] for an update on the status of any delays or closures.  Dermatology Medication Tips: Please keep the boxes that topical medications come in in order to help keep track of the instructions about where and how to use these. Pharmacies typically print the medication instructions only on the boxes and not directly on the medication tubes.   If your medication is too expensive, please contact our office at [phone] option 4 or send us  a message through MyChart.   We are unable to tell what your co-pay for medications will be in advance as this is different depending on your insurance coverage. However, we may be able to find a substitute medication at lower cost or fill out paperwork to get insurance to cover a needed medication.   If a prior authorization is required to get your medication covered by your insurance company, please allow us   1-2 business days to complete this process.  Drug prices often vary depending on where the prescription is filled and some pharmacies may offer cheaper prices.  The website www.goodrx.com contains coupons for medications through different pharmacies. The prices here do not account for what the cost may be with help from insurance (it may be cheaper with your insurance), but the website can give you the price if you did not use any insurance.  - You can print the associated coupon and take it with your  prescription to the pharmacy.  - You may also stop by our office during regular business hours and pick up a GoodRx coupon card.  - If you need your prescription sent electronically to a different pharmacy, notify our office through [facility] or by phone at [phone] option 4.     Si Usted Necesita Algo Despus de Su Visita  Tambin puede enviarnos un mensaje a travs de Clinical cytogeneticist. Por lo general respondemos a los mensajes de MyChart en el transcurso de 1 a 2 das hbiles.  Para renovar recetas, por favor pida a su farmacia que se ponga en contacto con nuestra oficina. Randi lakes de fax es [place] [phone].  Si tiene un asunto urgente cuando la clnica est cerrada y que no puede esperar hasta el siguiente da hbil, puede llamar/localizar a su doctor(a) al nmero que aparece a continuacin.   Por favor, tenga en cuenta que aunque hacemos todo lo posible para estar disponibles para asuntos urgentes fuera del horario de [place], no estamos disponibles las 24 horas del da, los 7 [address]  [address] de la [place].   Si tiene un problema urgente y no puede comunicarse con nosotros, puede optar por buscar atencin mdica  en el consultorio de su doctor(a), en una clnica privada, en un centro de atencin urgente o en una sala de emergencias.  Si tiene Engineer, drilling, por favor llame inmediatamente al 911 o vaya a la sala de emergencias.  Nmeros de bper  - Dr. Hester: [phone]  - Dra. Jackquline: [phone]  - Dr. Claudene: [phone]  - Dra. Kitts: [phone]  En caso de inclemencias del [place], por favor llame a nuestra lnea principal al [phone] para una actualizacin sobre el estado de cualquier retraso o cierre.  Consejos para la medicacin en dermatologa: Por favor, guarde las cajas en las que vienen los medicamentos de uso tpico para ayudarle a seguir las instrucciones sobre dnde y cmo usarlos. Las farmacias generalmente imprimen las instrucciones del  medicamento slo en las cajas y no directamente en los tubos del [place].   Si su medicamento es muy caro, por favor, pngase en contacto con landry rieger llamando al [phone] y presione la opcin 4 o envenos un mensaje a travs de Clinical cytogeneticist.   No podemos decirle cul ser su copago por los medicamentos por adelantado ya que esto es diferente dependiendo de la cobertura de su seguro. Sin embargo, es posible que podamos encontrar un medicamento sustituto a Audiological scientist un formulario para que el seguro cubra el medicamento que se considera necesario.   Si se requiere una autorizacin previa para que su compaa de seguros malta su medicamento, por favor permtanos de 1 a 2 das hbiles para completar este proceso.  Los precios de los medicamentos varan con frecuencia dependiendo del Environmental consultant de dnde se surte la receta y alguna farmacias pueden ofrecer precios ms baratos.  El sitio web www.goodrx.com tiene cupones para medicamentos de Health and safety inspector. Los precios aqu no tienen en  cuenta lo que podra costar con la ayuda del seguro (puede ser ms barato con su seguro), pero el sitio web puede darle el precio si no Visual merchandiser.  - Puede imprimir el cupn correspondiente y llevarlo con su receta a la farmacia.  - Tambin puede pasar por nuestra oficina durante el horario de atencin regular y Education officer, museum una tarjeta de cupones de GoodRx.  - Si necesita que su receta se enve electrnicamente a una farmacia diferente, informe a nuestra oficina a travs de MyChart de [place] o por telfono llamando al [phone] y presione la opcin 4.

## 2023-11-30 NOTE — Progress Notes (Signed)
   New Patient Visit   Subjective  Willie [name] is a 58 y.o. male who presents for the following: Spot on back of scalp. Hx of skin cancer removed from nose a couple of years ago. Bleeds at times. Tender at times. Dur: ~2 months  The patient has spots, moles and lesions to be evaluated, some may be new or changing and the patient may have concern these could be cancer.    The following portions of the chart were reviewed this encounter and updated as appropriate: medications, allergies, medical history  Review of Systems:  No other skin or systemic complaints except as noted in HPI or Assessment and Plan.  Objective  Well appearing patient in no apparent distress; mood and affect are within normal limits.  A focused examination was performed of the following areas: Scalp, face  Relevant physical exam findings are noted in the Assessment and Plan.  Occipital Scalp x1 Erythematous keratotic or waxy stuck-on papule or plaque.   Assessment & Plan    INFLAMED SEBORRHEIC KERATOSIS Occipital Scalp x1 Symptomatic, irritating, patient would like treated. Destruction of lesion - Occipital Scalp x1 Complexity: simple   Destruction method: cryotherapy   Informed consent: discussed and consent obtained   Timeout:  patient name, date of birth, surgical site, and procedure verified Lesion destroyed using liquid nitrogen: Yes   Region frozen until ice ball extended beyond lesion: Yes   Cryo cycles: 1 or 2. Outcome: patient tolerated procedure well with no complications   Post-procedure details: wound care instructions given   Additional details:  Prior to procedure, discussed risks of blister formation, small wound, skin dyspigmentation, or rare scar following cryotherapy. Recommend Vaseline ointment to treated areas while healing.     Return if symptoms worsen or fail to improve.  I, Jill Parcell, CMA, am acting as scribe for Boneta Sharps, MD.   Documentation: I have  reviewed the above documentation for accuracy and completeness, and I agree with the above.  Boneta Sharps, MD

## 2024-01-11 ENCOUNTER — Ambulatory Visit: Payer: Managed Care, Other (non HMO)

## 2024-01-11 DIAGNOSIS — I447 Left bundle-branch block, unspecified: Secondary | ICD-10-CM

## 2024-01-13 LAB — CUP PACEART REMOTE DEVICE CHECK
Battery Remaining Longevity: 64 mo
Battery Remaining Percentage: 66 %
Battery Voltage: 2.99 V
Brady Statistic AP VP Percent: 25 %
Brady Statistic AP VS Percent: 1 %
Brady Statistic AS VP Percent: 75 %
Brady Statistic AS VS Percent: 1 %
Brady Statistic RA Percent Paced: 25 %
Date Time Interrogation Session: 20260105210700
HighPow Impedance: 71 Ohm
HighPow Impedance: 71 Ohm
Implantable Lead Connection Status: 753985
Implantable Lead Connection Status: 753985
Implantable Lead Connection Status: 753985
Implantable Lead Implant Date: 20230703
Implantable Lead Implant Date: 20230703
Implantable Lead Implant Date: 20230703
Implantable Lead Location: 753858
Implantable Lead Location: 753859
Implantable Lead Location: 753860
Implantable Lead Model: 3830
Implantable Lead Model: 7122
Implantable Pulse Generator Implant Date: 20230703
Lead Channel Impedance Value: 460 Ohm
Lead Channel Impedance Value: 590 Ohm
Lead Channel Impedance Value: 690 Ohm
Lead Channel Pacing Threshold Amplitude: 0.5 V
Lead Channel Pacing Threshold Amplitude: 0.75 V
Lead Channel Pacing Threshold Amplitude: 0.75 V
Lead Channel Pacing Threshold Pulse Width: 0.05 ms
Lead Channel Pacing Threshold Pulse Width: 0.5 ms
Lead Channel Pacing Threshold Pulse Width: 0.5 ms
Lead Channel Sensing Intrinsic Amplitude: 11.9 mV
Lead Channel Sensing Intrinsic Amplitude: 2.9 mV
Lead Channel Setting Pacing Amplitude: 0.25 V
Lead Channel Setting Pacing Amplitude: 2 V
Lead Channel Setting Pacing Amplitude: 2 V
Lead Channel Setting Pacing Pulse Width: 0.05 ms
Lead Channel Setting Pacing Pulse Width: 0.5 ms
Lead Channel Setting Sensing Sensitivity: 0.5 mV
Pulse Gen Serial Number: 8906747

## 2024-01-15 NOTE — Progress Notes (Signed)
 Remote ICD Transmission

## 2024-01-16 ENCOUNTER — Ambulatory Visit: Payer: Self-pay | Admitting: Cardiology
